# Patient Record
Sex: Male | Born: 1945 | Race: Black or African American | Hispanic: No | Marital: Married | Smoking: Current every day smoker
Health system: Southern US, Community
[De-identification: ages and names within clinical notes are randomized; demographics above are authoritative.]

## PROBLEM LIST (undated history)

## (undated) ENCOUNTER — Emergency Department (HOSPITAL_BASED_OUTPATIENT_CLINIC_OR_DEPARTMENT_OTHER): Admission: EM | Payer: Medicare Other | Source: Home / Self Care

## (undated) DIAGNOSIS — I4891 Unspecified atrial fibrillation: Secondary | ICD-10-CM

## (undated) DIAGNOSIS — I10 Essential (primary) hypertension: Secondary | ICD-10-CM

## (undated) DIAGNOSIS — I7121 Aneurysm of the ascending aorta, without rupture: Secondary | ICD-10-CM

## (undated) DIAGNOSIS — E7800 Pure hypercholesterolemia, unspecified: Secondary | ICD-10-CM

## (undated) DIAGNOSIS — R011 Cardiac murmur, unspecified: Secondary | ICD-10-CM

## (undated) DIAGNOSIS — R931 Abnormal findings on diagnostic imaging of heart and coronary circulation: Secondary | ICD-10-CM

## (undated) DIAGNOSIS — K922 Gastrointestinal hemorrhage, unspecified: Secondary | ICD-10-CM

## (undated) DIAGNOSIS — J189 Pneumonia, unspecified organism: Secondary | ICD-10-CM

## (undated) HISTORY — PX: TONSILLECTOMY: SUR1361

---

## 2001-02-11 ENCOUNTER — Encounter: Payer: Self-pay | Admitting: Internal Medicine

## 2001-02-11 ENCOUNTER — Encounter: Admission: RE | Admit: 2001-02-11 | Discharge: 2001-02-11 | Payer: Self-pay | Admitting: Internal Medicine

## 2002-03-16 ENCOUNTER — Encounter: Payer: Self-pay | Admitting: Emergency Medicine

## 2002-03-16 ENCOUNTER — Emergency Department (HOSPITAL_COMMUNITY): Admission: EM | Admit: 2002-03-16 | Discharge: 2002-03-16 | Payer: Self-pay | Admitting: Emergency Medicine

## 2003-06-01 ENCOUNTER — Encounter: Payer: Self-pay | Admitting: Internal Medicine

## 2003-06-01 ENCOUNTER — Encounter: Admission: RE | Admit: 2003-06-01 | Discharge: 2003-06-01 | Payer: Self-pay | Admitting: Internal Medicine

## 2010-12-30 ENCOUNTER — Emergency Department (INDEPENDENT_AMBULATORY_CARE_PROVIDER_SITE_OTHER)

## 2010-12-30 ENCOUNTER — Emergency Department (HOSPITAL_BASED_OUTPATIENT_CLINIC_OR_DEPARTMENT_OTHER)
Admission: EM | Admit: 2010-12-30 | Discharge: 2010-12-31 | Disposition: A | Discharge status: Home / Self Care | Attending: Emergency Medicine | Admitting: Emergency Medicine

## 2010-12-30 DIAGNOSIS — F17200 Nicotine dependence, unspecified, uncomplicated: Secondary | ICD-10-CM | POA: Insufficient documentation

## 2010-12-30 DIAGNOSIS — R0989 Other specified symptoms and signs involving the circulatory and respiratory systems: Secondary | ICD-10-CM

## 2010-12-30 DIAGNOSIS — R05 Cough: Secondary | ICD-10-CM | POA: Insufficient documentation

## 2010-12-30 DIAGNOSIS — I10 Essential (primary) hypertension: Secondary | ICD-10-CM | POA: Insufficient documentation

## 2010-12-30 DIAGNOSIS — R059 Cough, unspecified: Secondary | ICD-10-CM | POA: Insufficient documentation

## 2010-12-30 DIAGNOSIS — J40 Bronchitis, not specified as acute or chronic: Secondary | ICD-10-CM | POA: Insufficient documentation

## 2013-03-16 ENCOUNTER — Telehealth: Payer: Self-pay | Admitting: Internal Medicine

## 2013-03-16 ENCOUNTER — Emergency Department (HOSPITAL_BASED_OUTPATIENT_CLINIC_OR_DEPARTMENT_OTHER)

## 2013-03-16 ENCOUNTER — Encounter (HOSPITAL_BASED_OUTPATIENT_CLINIC_OR_DEPARTMENT_OTHER): Payer: Self-pay | Admitting: *Deleted

## 2013-03-16 ENCOUNTER — Observation Stay (HOSPITAL_BASED_OUTPATIENT_CLINIC_OR_DEPARTMENT_OTHER)
Admission: EM | Admit: 2013-03-16 | Discharge: 2013-03-17 | Disposition: A | Discharge status: Home / Self Care | Attending: Internal Medicine | Admitting: Internal Medicine

## 2013-03-16 DIAGNOSIS — R1310 Dysphagia, unspecified: Secondary | ICD-10-CM | POA: Insufficient documentation

## 2013-03-16 DIAGNOSIS — R220 Localized swelling, mass and lump, head: Secondary | ICD-10-CM | POA: Insufficient documentation

## 2013-03-16 DIAGNOSIS — I119 Hypertensive heart disease without heart failure: Secondary | ICD-10-CM | POA: Diagnosis present

## 2013-03-16 DIAGNOSIS — E876 Hypokalemia: Secondary | ICD-10-CM | POA: Diagnosis present

## 2013-03-16 DIAGNOSIS — T783XXA Angioneurotic edema, initial encounter: Principal | ICD-10-CM

## 2013-03-16 DIAGNOSIS — I10 Essential (primary) hypertension: Secondary | ICD-10-CM | POA: Insufficient documentation

## 2013-03-16 DIAGNOSIS — T46905A Adverse effect of unspecified agents primarily affecting the cardiovascular system, initial encounter: Secondary | ICD-10-CM | POA: Insufficient documentation

## 2013-03-16 DIAGNOSIS — I1310 Hypertensive heart and chronic kidney disease without heart failure, with stage 1 through stage 4 chronic kidney disease, or unspecified chronic kidney disease: Secondary | ICD-10-CM | POA: Diagnosis present

## 2013-03-16 DIAGNOSIS — Z79899 Other long term (current) drug therapy: Secondary | ICD-10-CM | POA: Insufficient documentation

## 2013-03-16 HISTORY — DX: Essential (primary) hypertension: I10

## 2013-03-16 LAB — BASIC METABOLIC PANEL
BUN: 13 mg/dL (ref 6–23)
CO2: 26 mEq/L (ref 19–32)
Calcium: 10.5 mg/dL (ref 8.4–10.5)
Chloride: 103 mEq/L (ref 96–112)
Creatinine, Ser: 1.2 mg/dL (ref 0.50–1.35)
GFR calc Af Amer: 72 mL/min — ABNORMAL LOW (ref >90)
GFR calc non Af Amer: 62 mL/min — ABNORMAL LOW (ref >90)
Glucose, Bld: 103 mg/dL — ABNORMAL HIGH (ref 70–99)
Potassium: 3.3 mEq/L — ABNORMAL LOW (ref 3.5–5.1)
Sodium: 140 mEq/L (ref 135–145)

## 2013-03-16 LAB — GLUCOSE, CAPILLARY
Glucose-Capillary: 122 mg/dL — ABNORMAL HIGH (ref 70–99)
Glucose-Capillary: 136 mg/dL — ABNORMAL HIGH (ref 70–99)
Glucose-Capillary: 153 mg/dL — ABNORMAL HIGH (ref 70–99)

## 2013-03-16 LAB — CBC WITH DIFFERENTIAL/PLATELET
Basophils Absolute: 0 10*3/uL (ref 0.0–0.1)
Basophils Relative: 0 % (ref 0–1)
Eosinophils Absolute: 0.1 10*3/uL (ref 0.0–0.7)
Eosinophils Relative: 2 % (ref 0–5)
HCT: 42.3 % (ref 39.0–52.0)
Hemoglobin: 14.4 g/dL (ref 13.0–17.0)
Lymphocytes Relative: 30 % (ref 12–46)
Lymphs Abs: 1.7 10*3/uL (ref 0.7–4.0)
MCH: 30.6 pg (ref 26.0–34.0)
MCHC: 34 g/dL (ref 30.0–36.0)
MCV: 89.8 fL (ref 78.0–100.0)
Monocytes Absolute: 0.7 10*3/uL (ref 0.1–1.0)
Monocytes Relative: 14 % — ABNORMAL HIGH (ref 3–12)
Neutro Abs: 3 10*3/uL (ref 1.7–7.7)
Neutrophils Relative %: 54 % (ref 43–77)
Platelets: 238 10*3/uL (ref 150–400)
RBC: 4.71 MIL/uL (ref 4.22–5.81)
RDW: 14.2 % (ref 11.5–15.5)
WBC: 5.5 10*3/uL (ref 4.0–10.5)

## 2013-03-16 LAB — CBC
HCT: 40.6 % (ref 39.0–52.0)
Hemoglobin: 14.3 g/dL (ref 13.0–17.0)
MCH: 31.3 pg (ref 26.0–34.0)
MCHC: 35.2 g/dL (ref 30.0–36.0)
MCV: 88.8 fL (ref 78.0–100.0)
Platelets: 238 10*3/uL (ref 150–400)
RBC: 4.57 MIL/uL (ref 4.22–5.81)
RDW: 14.2 % (ref 11.5–15.5)
WBC: 4.9 10*3/uL (ref 4.0–10.5)

## 2013-03-16 LAB — CREATININE, SERUM
Creatinine, Ser: 1.24 mg/dL (ref 0.50–1.35)
GFR calc Af Amer: 69 mL/min — ABNORMAL LOW (ref >90)
GFR calc non Af Amer: 60 mL/min — ABNORMAL LOW (ref >90)

## 2013-03-16 LAB — MRSA PCR SCREENING: MRSA by PCR: NEGATIVE

## 2013-03-16 MED ORDER — DIPHENHYDRAMINE HCL 50 MG/ML IJ SOLN
12.5000 mg | Freq: Four times a day (QID) | INTRAMUSCULAR | Status: DC | PRN
Start: 2013-03-16 — End: 2013-03-17
  Administered 2013-03-16: 12.5 mg via INTRAVENOUS
  Filled 2013-03-16: qty 1

## 2013-03-16 MED ORDER — DEXAMETHASONE SODIUM PHOSPHATE 10 MG/ML IJ SOLN
INTRAMUSCULAR | Status: AC
  Administered 2013-03-16: 10 mg
  Filled 2013-03-16: qty 1

## 2013-03-16 MED ORDER — SODIUM CHLORIDE 0.9 % IV SOLN
Freq: Once | INTRAVENOUS | Status: AC
  Administered 2013-03-16: 1000 mL via INTRAVENOUS

## 2013-03-16 MED ORDER — DEXAMETHASONE SODIUM PHOSPHATE 10 MG/ML IJ SOLN
10.0000 mg | Freq: Once | INTRAMUSCULAR | Status: AC
  Administered 2013-03-16: 10 mg via INTRAVENOUS

## 2013-03-16 MED ORDER — FAMOTIDINE IN NACL 20-0.9 MG/50ML-% IV SOLN
INTRAVENOUS | Status: AC
  Administered 2013-03-16: 20 mg via INTRAVENOUS
  Filled 2013-03-16: qty 50

## 2013-03-16 MED ORDER — SODIUM CHLORIDE 0.9 % IV SOLN: INTRAVENOUS | Status: DC

## 2013-03-16 MED ORDER — DIPHENHYDRAMINE HCL 50 MG/ML IJ SOLN
INTRAMUSCULAR | Status: AC
  Filled 2013-03-16: qty 1

## 2013-03-16 MED ORDER — METHYLPREDNISOLONE SODIUM SUCC 125 MG IJ SOLR
60.0000 mg | INTRAMUSCULAR | Status: DC
  Administered 2013-03-17: 60 mg via INTRAVENOUS
  Filled 2013-03-16 (×2): qty 0.96

## 2013-03-16 MED ORDER — DIPHENHYDRAMINE HCL 50 MG/ML IJ SOLN
50.0000 mg | Freq: Once | INTRAMUSCULAR | Status: AC
  Administered 2013-03-16: 50 mg via INTRAVENOUS

## 2013-03-16 MED ORDER — FAMOTIDINE IN NACL 20-0.9 MG/50ML-% IV SOLN
20.0000 mg | Freq: Once | INTRAVENOUS | Status: AC
  Administered 2013-03-16: 20 mg via INTRAVENOUS

## 2013-03-16 MED ORDER — ONDANSETRON HCL 4 MG PO TABS: 4.0000 mg | ORAL_TABLET | Freq: Four times a day (QID) | ORAL | Status: DC | PRN

## 2013-03-16 MED ORDER — HYDRALAZINE HCL 20 MG/ML IJ SOLN: 5.0000 mg | Freq: Four times a day (QID) | INTRAMUSCULAR | Status: DC | PRN

## 2013-03-16 MED ORDER — SODIUM CHLORIDE 0.9 % IJ SOLN
3.0000 mL | Freq: Two times a day (BID) | INTRAMUSCULAR | Status: DC
  Administered 2013-03-16 – 2013-03-17 (×3): 3 mL via INTRAVENOUS

## 2013-03-16 MED ORDER — ONDANSETRON HCL 4 MG/2ML IJ SOLN: 4.0000 mg | Freq: Four times a day (QID) | INTRAMUSCULAR | Status: DC | PRN

## 2013-03-16 MED ORDER — POTASSIUM CHLORIDE CRYS ER 20 MEQ PO TBCR
40.0000 meq | EXTENDED_RELEASE_TABLET | Freq: Once | ORAL | Status: AC
  Administered 2013-03-16: 40 meq via ORAL
  Filled 2013-03-16: qty 2

## 2013-03-16 MED ORDER — ENOXAPARIN SODIUM 40 MG/0.4ML ~~LOC~~ SOLN
40.0000 mg | SUBCUTANEOUS | Status: DC
  Administered 2013-03-16: 40 mg via SUBCUTANEOUS
  Filled 2013-03-16 (×2): qty 0.4

## 2013-03-16 NOTE — ED Notes (Signed)
I placed patient on wall monitor/5 lead.

## 2013-03-16 NOTE — ED Notes (Signed)
Updated pt's wife that he will be admitted to 2100 at [facility]

## 2013-03-16 NOTE — ED Notes (Signed)
Pt's airway remains patent. Sinus on tele. States no real change after medications again. VSS. Awaiting [facility]. No distress.

## 2013-03-16 NOTE — ED Notes (Signed)
Report given to [facility]. 

## 2013-03-16 NOTE — ED Provider Notes (Signed)
CSN: [id number]     Arrival date & time 03/16/13  0510 History     First MD Initiated Contact with Patient 03/16/13 [phone]     Chief Complaint  Patient presents with  . Throat Swelling    (Consider location/radiation/quality/duration/timing/severity/associated sxs/prior Treatment) HPI This is a 67 year old male who was changed from losartan to lisinopril about a week ago. He is here with about 1-2 hours of swelling in his throat. He feels as if his ability to breathe is being cut off and his voice is altered. He is still able to speak and breathe without difficulty. The symptoms are moderate at this time. It is not associated with pain. He's never had a history of this in the past.  Past Medical History  Diagnosis Date  . Hypertension    Past Surgical History  Procedure Laterality Date  . Tonsillectomy     No family history on file. History  Substance Use Topics  . Smoking status: Current Every Day Smoker  . Smokeless tobacco: Not on file  . Alcohol Use: Yes    Review of Systems  All other systems reviewed and are negative.    Allergies  Ace inhibitors  Home Medications   Current Outpatient Rx  Name  Route  Sig  Dispense  Refill  . amLODipine (NORVASC) 10 MG tablet   Oral   Take 10 mg by mouth daily.         Marland Kitchen HYDROCHLOROTHIAZIDE PO   Oral   Take by mouth.         Marland Kitchen lisinopril (PRINIVIL,ZESTRIL) 40 MG tablet   Oral   Take 40 mg by mouth daily.         Marland Kitchen triamterene-hydrochlorothiazide (DYAZIDE) 50-25 MG per capsule   Oral   Take 1 capsule by mouth every morning.          BP 167/102  Pulse 101  Temp(Src) 98 F (36.7 C) (Oral)  Ht 5\' 11"  (1.803 m)  Wt 224 lb (101.606 kg)  BMI 31.26 kg/m2  SpO2 98%  Physical Exam General: Well-developed, well-nourished male in no acute distress; appearance consistent with age of record HENT: normocephalic, atraumatic; angioedema of soft palate; "hot potato" voice Eyes: pupils equal round and reactive to light;  extraocular muscles intact Neck: supple Heart: regular rate and rhythm Lungs: clear to auscultation bilaterally Abdomen: soft; nondistended Extremities: No deformity; full range of motion; pulses normal Neurologic: Awake, alert and oriented; motor function intact in all extremities and symmetric; no facial droop Skin: Warm and dry Psychiatric: Normal mood and affect    ED Course   Procedures (including critical care time)   MDM   Nursing notes and vitals signs, including pulse oximetry, reviewed.  Summary of this visit's results, reviewed by myself:  Labs:  Results for orders placed during the hospital encounter of 03/16/13 (from the past 24 hour(s))  BASIC METABOLIC PANEL     Status: Abnormal   Collection Time    03/16/13  5:27 AM      Result Value Range   Sodium 140  135 - 145 mEq/L   Potassium 3.3 (*) 3.5 - 5.1 mEq/L   Chloride 103  96 - 112 mEq/L   CO2 26  19 - 32 mEq/L   Glucose, Bld 103 (*) 70 - 99 mg/dL   BUN 13  6 - 23 mg/dL   Creatinine, Ser 1.19  0.50 - 1.35 mg/dL   Calcium 14.7  8.4 - 82.9 mg/dL   GFR calc non  Af Amer 62 (*) >90 mL/min   GFR calc Af Amer 72 (*) >90 mL/min  CBC WITH DIFFERENTIAL     Status: Abnormal   Collection Time    03/16/13  5:27 AM      Result Value Range   WBC 5.5  4.0 - 10.5 K/uL   RBC 4.71  4.22 - 5.81 MIL/uL   Hemoglobin 14.4  13.0 - 17.0 g/dL   HCT 16.1  09.6 - 04.5 %   MCV 89.8  78.0 - 100.0 fL   MCH 30.6  26.0 - 34.0 pg   MCHC 34.0  30.0 - 36.0 g/dL   RDW 40.9  81.1 - 91.4 %   Platelets 238  150 - 400 K/uL   Neutrophils Relative % 54  43 - 77 %   Neutro Abs 3.0  1.7 - 7.7 K/uL   Lymphocytes Relative 30  12 - 46 %   Lymphs Abs 1.7  0.7 - 4.0 K/uL   Monocytes Relative 14 (*) 3 - 12 %   Monocytes Absolute 0.7  0.1 - 1.0 K/uL   Eosinophils Relative 2  0 - 5 %   Eosinophils Absolute 0.1  0.0 - 0.7 K/uL   Basophils Relative 0  0 - 1 %   Basophils Absolute 0.0  0.0 - 0.1 K/uL    Imaging Studies: Dg Neck Soft  Tissue  03/16/2013   *RADIOLOGY REPORT*  Clinical Data: Throat swelling starting 2 hours prior.  NECK SOFT TISSUES - 1+ VIEW  Comparison: None.  Findings: Mild thickening of the posterior pharyngeal tissues at and above the level of C1.  When accounting for rotation, the epiglottis is not thickened.  The aryepiglottic folds are distinct.  The imaged airway is patent, seen to the level of the esophageal verge.  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  IMPRESSION: 1. The imaged airway is patent. 2. Mild posterior pharyngeal thickening at the level of the nasopharynx.   Original Report Authenticated By: Tiburcio Pea    6:11 AM Patient accepted for transfer to [facility] for observation. He has been given dexamethasone, Benadryl and Pepcid IV. He states his symptoms remain unchanged. He is still able to speak and has no breathing difficulty at this time.  Hanley Seamen, MD 03/16/13 [phone]

## 2013-03-16 NOTE — ED Notes (Signed)
Report to Lurena Joiner, [place] 2100

## 2013-03-16 NOTE — Progress Notes (Signed)
Utilization Review Completed.Gracin Mcpartland T7/27/2014  

## 2013-03-16 NOTE — H&P (Addendum)
[facility] History and Physical  Richard Sampson:[id number] DOB: [birth year] DOA: 03/16/2013  Referring physician: EDP PCP: No primary provider on file.  Specialists: none  Chief Complaint: tightness in the throat last night.   HPI: Richard Sampson is a 67 y.o. male with prior h/o hypertension, came in from med center for angioedema after lisinopril intake. Pt reports that he took a lisinopril pill at 10 30, few after after that felt like his throat is closing up and had difficulty swallowing,went to ED and [facility]. His airway is patent , on RA, with good oxygen sat.s his tongue swelling improved and pt reports his throat feels little better. He is admitted to medical service for further evaluation. He received a dose of decadron, benadryl and pepcid in ED.   Review of Systems: The patient denies anorexia, fever, weight loss,, vision loss, decreased hearing, hoarseness, chest pain, syncope, dyspnea on exertion, peripheral edema, balance deficits, hemoptysis, abdominal pain, melena, hematochezia, severe indigestion/heartburn, hematuria, incontinence, genital sores, muscle weakness, suspicious skin lesions, transient blindness, difficulty walking, depression, unusual weight change, abnormal bleeding, enlarged lymph nodes, angioedema, and breast masses.    Past Medical History  Diagnosis Date  . Hypertension    Past Surgical History  Procedure Laterality Date  . Tonsillectomy     Social History:  reports that he has been smoking.  He does not have any smokeless tobacco history on file. He reports that  drinks alcohol. His drug history is not on file.  where does patient live--home,  Allergies  Allergen Reactions  . Ace Inhibitors Other (See Comments)    Reaction unknown    No family history on file.   Prior to Admission medications   Medication Sig Start Date End Date Taking? Authorizing Provider  amLODipine (NORVASC) 10 MG  tablet Take 10 mg by mouth daily.   Yes Historical Provider, MD  HYDROCHLOROTHIAZIDE PO Take by mouth.   Yes Historical Provider, MD  lisinopril (PRINIVIL,ZESTRIL) 40 MG tablet Take 40 mg by mouth daily.   Yes Historical Provider, MD   Physical Exam: Filed Vitals:   03/16/13 0900 03/16/13 1000 03/16/13 1100 03/16/13 1200  BP: 114/73 120/81 116/74 122/85  Pulse: 76 74 80 82  Temp:    97.7 F (36.5 C)  TempSrc:      Resp: 14 15 14 15   Height:      Weight:      SpO2: 95% 93% 94% 95%    Constitutional: Vital signs reviewed.  Patient is a well-developed and well-nourished in no acute distress and cooperative with exam. Alert and oriented x3.  Head: Normocephalic and atraumatic Mouth: no erythema or exudates, MMM Eyes: PERRL, EOMI, conjunctivae normal, No scleral icterus.  Neck: Supple, Trachea midline normal ROM, No JVD, mass, thyromegaly, or carotid bruit present.  Cardiovascular: RRR, S1 normal, S2 normal, no MRG, pulses symmetric and intact bilaterally Pulmonary/Chest: normal respiratory effort, CTAB, no wheezes, rales, or rhonchi Abdominal: Soft. Non-tender, non-distended, bowel sounds are normal, no masses, organomegaly, or guarding present.  Musculoskeletal: No joint deformities, erythema, or stiffness, ROM full and no nontender Neurological: A&O x3, Strength is normal and symmetric bilaterally, cranial nerve II-XII are grossly intact, no focal motor deficit, sensory intact to light touch bilaterally.  Skin: Warm, dry and intact. No rash, cyanosis, or clubbing.  Psychiatric: Normal mood and affect. speech and behavior is normal.  Labs on Admission:  Basic Metabolic Panel:  Recent Labs Lab 03/16/13 0527 03/16/13 1156  NA 140  --   K 3.3*  --   CL 103  --   CO2 26  --   GLUCOSE 103*  --   BUN 13  --   CREATININE 1.20 1.24  CALCIUM 10.5  --    Liver Function Tests: No results found for this basename: AST, ALT, ALKPHOS, BILITOT, PROT, ALBUMIN,  in the last 168 hours No  results found for this basename: LIPASE, AMYLASE,  in the last 168 hours No results found for this basename: AMMONIA,  in the last 168 hours CBC:  Recent Labs Lab 03/16/13 0527 03/16/13 1156  WBC 5.5 4.9  NEUTROABS 3.0  --   HGB 14.4 14.3  HCT 42.3 40.6  MCV 89.8 88.8  PLT 238 238   Cardiac Enzymes: No results found for this basename: CKTOTAL, CKMB, CKMBINDEX, TROPONINI,  in the last 168 hours  BNP (last 3 results) No results found for this basename: PROBNP,  in the last 8760 hours CBG:  Recent Labs Lab 03/16/13 0728  GLUCAP 122*    Radiological Exams on Admission: Dg Neck Soft Tissue  03/16/2013   *RADIOLOGY REPORT*  Clinical Data: Throat swelling starting 2 hours prior.  NECK SOFT TISSUES - 1+ VIEW  Comparison: None.  Findings: Mild thickening of the posterior pharyngeal tissues at and above the level of C1.  When accounting for rotation, the epiglottis is not thickened.  The aryepiglottic folds are distinct.  The imaged airway is patent, seen to the level of the esophageal verge.  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  IMPRESSION: 1. The imaged airway is patent. 2. Mild posterior pharyngeal thickening at the level of the nasopharynx.   Original Report Authenticated By: Tiburcio Pea    EKG: NOT DONE.   Assessment/Plan Active Problems: 1. ANGIOEDEMA from the ace inhibitors:  - admit to stepdown - observation, with symptomatic treatment.  - parent airway with good oxygen sat's on RA - PT reports improvement of the tightness int he throat.  - solumedrol, benadryl and pepcid on board.   2. Hypertension: controlled. Home meds on hold. Prn hydralazine.   3. Hypokalemia: wil be repleted as needed.   3. DVT prophylaxis.   Code Status: full code Family Communication: wife at bedside and updated. Disposition Plan: pending.   Time spent: 65 min  Rollin Kotowski [facility] Pager [phone]  If  7PM-7AM, please contact night-coverage [email or web address] Password [facility] 03/16/2013, 1:49 PM

## 2013-03-16 NOTE — ED Notes (Addendum)
Pt c/o "throat swelling" that started about 2 hours ago. Denies any new foods. Started on Lisinopril one week ago.  Pt presents with swelling to the posterior throat. Able to talk and maintain airway at present. resp even and unlabored. 02 sat 97% on RA.

## 2013-03-16 NOTE — Telephone Encounter (Signed)
Received a call from Dr. Read Drivers from [facility] regarding transfer to [facility]. This patient was recent switched from losartan to lisinopril and developed angioedema. Currently, he is not having airway obstruction and is hemodynamically stable. He needs admission for further monitoring.  He is a patient of Dr. Velna Hatchet.  I accepted the patient for transfer to a stepdown bed through [facility].

## 2013-03-16 NOTE — ED Notes (Signed)
[facility] here to transport pt to [facility] 

## 2013-03-16 NOTE — Progress Notes (Signed)
Pt. Admitted to 2110. He is alert and oriented x3. Airway is patent. He is able to speak in his normal voice. PIV is infusing at [facility] and he has his call bell. He knows to call for any difficulty with swallowing. He is on RA with sa02 of 98%.

## 2013-03-17 LAB — CBC
HCT: 37.6 % — ABNORMAL LOW (ref 39.0–52.0)
Hemoglobin: 13.2 g/dL (ref 13.0–17.0)
MCH: 31 pg (ref 26.0–34.0)
MCHC: 35.1 g/dL (ref 30.0–36.0)
MCV: 88.3 fL (ref 78.0–100.0)
Platelets: 212 10*3/uL (ref 150–400)
RBC: 4.26 MIL/uL (ref 4.22–5.81)
RDW: 14.2 % (ref 11.5–15.5)
WBC: 6.3 10*3/uL (ref 4.0–10.5)

## 2013-03-17 LAB — BASIC METABOLIC PANEL
BUN: 15 mg/dL (ref 6–23)
CO2: 27 mEq/L (ref 19–32)
Calcium: 9.5 mg/dL (ref 8.4–10.5)
Chloride: 102 mEq/L (ref 96–112)
Creatinine, Ser: 1.22 mg/dL (ref 0.50–1.35)
GFR calc Af Amer: 71 mL/min — ABNORMAL LOW (ref >90)
GFR calc non Af Amer: 61 mL/min — ABNORMAL LOW (ref >90)
Glucose, Bld: 117 mg/dL — ABNORMAL HIGH (ref 70–99)
Potassium: 3.3 mEq/L — ABNORMAL LOW (ref 3.5–5.1)
Sodium: 137 mEq/L (ref 135–145)

## 2013-03-17 MED ORDER — POTASSIUM CHLORIDE CRYS ER 20 MEQ PO TBCR
20.0000 meq | EXTENDED_RELEASE_TABLET | ORAL | Status: AC
  Administered 2013-03-17 (×2): 20 meq via ORAL
  Filled 2013-03-17 (×2): qty 1

## 2013-03-17 MED ORDER — EPINEPHRINE 0.3 MG/0.3ML IJ SOAJ: 0.3000 mg | Freq: Once | INTRAMUSCULAR | Status: DC

## 2013-03-17 MED ORDER — FAMOTIDINE 20 MG PO TABS: 20.0000 mg | ORAL_TABLET | Freq: Two times a day (BID) | ORAL | Status: DC

## 2013-03-17 MED ORDER — DIPHENHYDRAMINE HCL 25 MG PO TABS: ORAL_TABLET | ORAL | Status: DC

## 2013-03-17 MED ORDER — PREDNISONE 10 MG PO TABS: ORAL_TABLET | ORAL | Status: DC

## 2013-03-17 NOTE — Progress Notes (Signed)
[facility] ADULT ICU REPLACEMENT PROTOCOL FOR AM LAB REPLACEMENT ONLY  The patient does apply for the [facility] Adult ICU Electrolyte Replacment Protocol based on the criteria listed below:   1. Is GFR >/= 40 ml/min? yes  Patient's GFR today is 61  2. Is urine output >/= 0.5 ml/kg/hr for the last 6 hours? yes Patient's UOP is 0.7 ml/kg/hr 3. Is BUN < 60 mg/dL? yes  Patient's BUN today is 15 4. Abnormal electrolyte(s): K3.3 5. Ordered repletion with: PO/KCL 6. If a panic level lab has been reported, has the CCM MD in charge been notified? yes.   Physician:  Langston Masker MD  Melrose Nakayama 03/17/2013 6:09 AM

## 2013-03-17 NOTE — Discharge Summary (Signed)
Physician Discharge Summary  TAREZ BOWNS BJY:[id number] DOB: [birth year] DOA: 03/16/2013  PCP: Dr. [name]  Admit date: 03/16/2013 Discharge date: 03/17/2013  Time spent: 30 minutes  Recommendations for Outpatient Follow-up:  1. Followup with [facility] as previously scheduled 2. Followup with primary care physician Dr. Allyne Gee in 1-2 weeks for routine hospital follow up 3. Patient advised to keep EpiPen with him at all times and to avoid ACE inhibitors and angiotensin blocking agents 4. Recommend repeat electrolyte panel to followup with primary care physician. Noted to have hypokalemia which was mild at presentation and likely related to ongoing thiazide diuretic usage. Patient may need supplemental potassium if he is to continue this medication over the long term.  Discharge Diagnoses:    ACE associated Angio-edema    Essential hypertension, benign   Hypokalemia  Discharge Condition: stable  Diet recommendation: Heart healthy  Filed Weights   03/16/13 0514  Weight: 101.606 kg (224 lb)    History of present illness:  67 yo male patient w/ hypertension. Sent from [facility] for angioedema onset after taking 4th dose of new medication: Lisinopril. Patient developed a sensation of throat closing up and difficulty swallowing after taking this medication and was noted to have symptoms of angioedema upon presentation to the emergency department. Because of concerns for progression of allergic reaction and concerns for airway obstruction patient was transferred to [facility]. On room air his saturations were good and by the time he arrived to this facility his tongue edema had improved and the patient reported decreased sensation of closing throat. He had been treated with Decadron, Benadryl and Pepcid at the previous facility.  Hospital Course:   Allergic reaction caused by drug / angioedema due to ACE inhibitor -Complete resolution of symptoms with  administration of steroids, H2 blockers and antihistamines -Given prescription for EpiPen and instructed to take over-the-counter Benadryl and Pepcid for the next 5 days -Instructed to call 911 / return ER if symptoms recur -Also given five-day taper of steroids beginning with 50 mg dose and decreasing by 10 mg daily until completed -Instructed to abstain from angiotensin receptor blocking agents as well as ACE inhibitors life-long  Hypokalemia -on diuretics prior to admission -recommend BMET at PCP follow up - may need long term supplemental KCL  Procedures:  None  Consultations:  None  Discharge Exam: Filed Vitals:   03/17/13 0700 03/17/13 0800 03/17/13 0842 03/17/13 0900  BP: 120/73 124/82  141/102  Pulse: 79 71  85  Temp:   98 F (36.7 C)   TempSrc:   Oral   Resp: 14 17  19   Height:      Weight:      SpO2: 96% 96%  99%   General: No acute respiratory distress ENT: Previous periorbital edema has resolved, no tongue edema, no difficulty swallowing and able to maintain/swallow oral secretions Lungs: Clear to auscultation bilaterally without wheezes or crackles, RA Cardiovascular: Regular rate and rhythm without murmur gallop or rub normal S1 and S2, no peripheral edema or JVD Abdomen: Nontender, nondistended, soft, bowel sounds positive, no rebound, no ascites, no appreciable mass Musculoskeletal: No significant cyanosis, clubbing of bilateral lower extremities Neurological: Alert and oriented x 3, moves all extremities x 4 without focal neurological deficits, CN 2-12 intact   Discharge Instructions      Discharge Orders   Future Orders Complete By Expires     Call MD for:  difficulty breathing, headache or visual disturbances  As directed  Call MD for:  hives  As directed     Call MD for:  persistant dizziness or light-headedness  As directed     Call MD for:  As directed     Scheduling Instructions:      Any recurrence of swelling of face or eyes, swelling of  tongue, difficulty swallowing or sensation of throat closing, or shortness of breath. If these symptoms do recur please utilize EpiPen and notify M.D./call EMS immediately    Diet - low sodium heart healthy  As directed     Increase activity slowly  As directed         Medication List         acetaminophen 325 MG tablet  Commonly known as:  TYLENOL  Take 650 mg by mouth every 6 (six) hours as needed for pain.     amLODipine 10 MG tablet  Commonly known as:  NORVASC  Take 10 mg by mouth daily.     atorvastatin 20 MG tablet  Commonly known as:  LIPITOR  Take 10 mg by mouth daily.     diphenhydrAMINE 25 MG tablet  Commonly known as:  BENADRYL  Take 1/2 tab or 12.5 mg 3 times daily for recent allergic reaction     EPINEPHrine 0.3 mg/0.3 mL Soaj  Commonly known as:  EPIPEN  Inject 0.3 mLs (0.3 mg total) into the muscle once.     famotidine 20 MG tablet  Commonly known as:  PEPCID  Take 1 tablet (20 mg total) by mouth 2 (two) times daily.     potassium chloride SA 20 MEQ tablet  Commonly known as:  K-DUR,KLOR-CON  Take 20 mEq by mouth 2 (two) times daily.     predniSONE 10 MG tablet  Commonly known as:  DELTASONE  Take 5 tabs daily beginning 7/28 then 4 tabs on 7/29 then 3 tabs on 7/30 then 2 tabs on 7/31 then 1 tab on 8/1 then stop     triamterene-hydrochlorothiazide 75-50 MG per tablet  Commonly known as:  MAXZIDE  Take 1 tablet by mouth daily.     Vitamin D (Ergocalciferol) 50000 UNITS Caps  Commonly known as:  DRISDOL  Take 50,000 Units by mouth every 7 (seven) days. Thursdays       Allergies  Allergen Reactions  . Ace Inhibitors Swelling    Throat swelling; pt was on lisinopril  . Angiotensin Receptor Blockers     Cross reactivity in known ACE I allergy   Follow-up Information   Schedule an appointment as soon as possible for a visit with [facility]. (to be seen in 1-2 weeks)    Contact information:   as previously scheduled      Schedule an  appointment as soon as possible for a visit with SANDERS,ROBYN N, MD. (call to be seen for routine hospital follow up)    Contact information:   [address] [place] [address] [phone]        The results of significant diagnostics from this hospitalization (including imaging, microbiology, ancillary and laboratory) are listed below for reference.    Significant Diagnostic Studies: Dg Neck Soft Tissue  03/16/2013   *RADIOLOGY REPORT*  Clinical Data: Throat swelling starting 2 hours prior.  NECK SOFT TISSUES - 1+ VIEW  Comparison: None.  Findings: Mild thickening of the posterior pharyngeal tissues at and above the level of C1.  When accounting for rotation, the epiglottis is not thickened.  The aryepiglottic folds are distinct.  The  imaged airway is patent, seen to the level of the esophageal verge.  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  IMPRESSION: 1. The imaged airway is patent. 2. Mild posterior pharyngeal thickening at the level of the nasopharynx.   Original Report Authenticated By: Tiburcio Pea    Microbiology: Recent Results (from the past 240 hour(s))  MRSA PCR SCREENING     Status: None   Collection Time    03/16/13  7:37 AM      Result Value Range Status   MRSA by PCR NEGATIVE  NEGATIVE Final   Comment:            The GeneXpert MRSA Assay (FDA     approved for NASAL specimens     only), is one component of a     comprehensive MRSA colonization     surveillance program. It is not     intended to diagnose MRSA     infection nor to guide or     monitor treatment for     MRSA infections.     Labs: Basic Metabolic Panel:  Recent Labs Lab 03/16/13 0527 03/16/13 1156 03/17/13 0447  NA 140  --  137  K 3.3*  --  3.3*  CL 103  --  102  CO2 26  --  27  GLUCOSE 103*  --  117*  BUN 13  --  15  CREATININE 1.20 1.24 1.22  CALCIUM 10.5  --  9.5   CBC:  Recent Labs Lab 03/16/13 0527  03/16/13 1156 03/17/13 0447  WBC 5.5 4.9 6.3  NEUTROABS 3.0  --   --   HGB 14.4 14.3 13.2  HCT 42.3 40.6 37.6*  MCV 89.8 88.8 88.3  PLT 238 238 212   CBG:  Recent Labs Lab 03/16/13 0728 03/16/13 1559 03/16/13 2024  GLUCAP 122* 136* 153*   Signed:  ELLIS,ALLISON L. ANP [facility] 03/17/2013, 10:50 AM  I have personally examined this patient and reviewed the entire database. I have reviewed the above note, made any necessary editorial changes, and agree with its content.  Lonia Blood, MD [facility]

## 2013-03-17 NOTE — Progress Notes (Signed)
Discharge instrctions given to pt and pt discharged home with wife

## 2013-09-01 DIAGNOSIS — Z79899 Other long term (current) drug therapy: Secondary | ICD-10-CM | POA: Diagnosis not present

## 2013-09-01 DIAGNOSIS — I10 Essential (primary) hypertension: Secondary | ICD-10-CM | POA: Diagnosis not present

## 2013-09-01 DIAGNOSIS — M542 Cervicalgia: Secondary | ICD-10-CM | POA: Diagnosis not present

## 2013-09-24 DIAGNOSIS — E663 Overweight: Secondary | ICD-10-CM | POA: Diagnosis not present

## 2013-09-24 DIAGNOSIS — M549 Dorsalgia, unspecified: Secondary | ICD-10-CM | POA: Diagnosis not present

## 2013-09-25 ENCOUNTER — Other Ambulatory Visit: Payer: Self-pay | Admitting: Neurosurgery

## 2013-09-25 DIAGNOSIS — M549 Dorsalgia, unspecified: Secondary | ICD-10-CM

## 2013-09-28 ENCOUNTER — Ambulatory Visit
Admission: RE | Admit: 2013-09-28 | Discharge: 2013-09-28 | Disposition: A | Discharge status: Home / Self Care | Source: Ambulatory Visit | Attending: Neurosurgery | Admitting: Neurosurgery

## 2013-09-28 DIAGNOSIS — M4310 Spondylolisthesis, site unspecified: Secondary | ICD-10-CM | POA: Diagnosis not present

## 2013-09-28 DIAGNOSIS — M5126 Other intervertebral disc displacement, lumbar region: Secondary | ICD-10-CM | POA: Diagnosis not present

## 2013-09-28 DIAGNOSIS — M47817 Spondylosis without myelopathy or radiculopathy, lumbosacral region: Secondary | ICD-10-CM | POA: Diagnosis not present

## 2013-09-28 DIAGNOSIS — M48061 Spinal stenosis, lumbar region without neurogenic claudication: Secondary | ICD-10-CM | POA: Diagnosis not present

## 2013-09-28 DIAGNOSIS — M549 Dorsalgia, unspecified: Secondary | ICD-10-CM

## 2013-11-12 DIAGNOSIS — Q762 Congenital spondylolisthesis: Secondary | ICD-10-CM | POA: Diagnosis not present

## 2013-11-12 DIAGNOSIS — I10 Essential (primary) hypertension: Secondary | ICD-10-CM | POA: Diagnosis not present

## 2014-07-17 ENCOUNTER — Emergency Department (HOSPITAL_BASED_OUTPATIENT_CLINIC_OR_DEPARTMENT_OTHER): Payer: BC Managed Care – PPO

## 2014-07-17 ENCOUNTER — Encounter (HOSPITAL_BASED_OUTPATIENT_CLINIC_OR_DEPARTMENT_OTHER): Payer: Self-pay

## 2014-07-17 ENCOUNTER — Inpatient Hospital Stay (HOSPITAL_BASED_OUTPATIENT_CLINIC_OR_DEPARTMENT_OTHER)
Admission: EM | Admit: 2014-07-17 | Discharge: 2014-07-18 | DRG: 309 | Disposition: A | Discharge status: Home / Self Care | Payer: BC Managed Care – PPO | Attending: Internal Medicine | Admitting: Internal Medicine

## 2014-07-17 DIAGNOSIS — R002 Palpitations: Secondary | ICD-10-CM | POA: Diagnosis not present

## 2014-07-17 DIAGNOSIS — I059 Rheumatic mitral valve disease, unspecified: Secondary | ICD-10-CM | POA: Diagnosis not present

## 2014-07-17 DIAGNOSIS — R739 Hyperglycemia, unspecified: Secondary | ICD-10-CM | POA: Diagnosis present

## 2014-07-17 DIAGNOSIS — I480 Paroxysmal atrial fibrillation: Secondary | ICD-10-CM

## 2014-07-17 DIAGNOSIS — I4891 Unspecified atrial fibrillation: Secondary | ICD-10-CM | POA: Diagnosis present

## 2014-07-17 DIAGNOSIS — Q211 Atrial septal defect: Secondary | ICD-10-CM

## 2014-07-17 DIAGNOSIS — I10 Essential (primary) hypertension: Secondary | ICD-10-CM | POA: Diagnosis present

## 2014-07-17 DIAGNOSIS — T783XXA Angioneurotic edema, initial encounter: Secondary | ICD-10-CM | POA: Diagnosis present

## 2014-07-17 DIAGNOSIS — E876 Hypokalemia: Secondary | ICD-10-CM | POA: Diagnosis present

## 2014-07-17 DIAGNOSIS — F17210 Nicotine dependence, cigarettes, uncomplicated: Secondary | ICD-10-CM | POA: Diagnosis present

## 2014-07-17 DIAGNOSIS — R0602 Shortness of breath: Secondary | ICD-10-CM | POA: Diagnosis not present

## 2014-07-17 DIAGNOSIS — N179 Acute kidney failure, unspecified: Secondary | ICD-10-CM | POA: Diagnosis present

## 2014-07-17 DIAGNOSIS — E785 Hyperlipidemia, unspecified: Secondary | ICD-10-CM

## 2014-07-17 DIAGNOSIS — R079 Chest pain, unspecified: Secondary | ICD-10-CM | POA: Diagnosis not present

## 2014-07-17 DIAGNOSIS — I1310 Hypertensive heart and chronic kidney disease without heart failure, with stage 1 through stage 4 chronic kidney disease, or unspecified chronic kidney disease: Secondary | ICD-10-CM | POA: Diagnosis present

## 2014-07-17 DIAGNOSIS — I119 Hypertensive heart disease without heart failure: Secondary | ICD-10-CM | POA: Diagnosis present

## 2014-07-17 DIAGNOSIS — Z79899 Other long term (current) drug therapy: Secondary | ICD-10-CM | POA: Diagnosis not present

## 2014-07-17 DIAGNOSIS — R072 Precordial pain: Secondary | ICD-10-CM | POA: Diagnosis not present

## 2014-07-17 DIAGNOSIS — R52 Pain, unspecified: Secondary | ICD-10-CM

## 2014-07-17 HISTORY — DX: Pure hypercholesterolemia, unspecified: E78.00

## 2014-07-17 HISTORY — DX: Unspecified atrial fibrillation: I48.91

## 2014-07-17 HISTORY — DX: Paroxysmal atrial fibrillation: I48.0

## 2014-07-17 LAB — CBC WITH DIFFERENTIAL/PLATELET
Basophils Absolute: 0 10*3/uL (ref 0.0–0.1)
Basophils Relative: 0 % (ref 0–1)
Eosinophils Absolute: 0.1 10*3/uL (ref 0.0–0.7)
Eosinophils Relative: 2 % (ref 0–5)
HCT: 38.4 % — ABNORMAL LOW (ref 39.0–52.0)
Hemoglobin: 12.7 g/dL — ABNORMAL LOW (ref 13.0–17.0)
Lymphocytes Relative: 26 % (ref 12–46)
Lymphs Abs: 1.6 10*3/uL (ref 0.7–4.0)
MCH: 28.7 pg (ref 26.0–34.0)
MCHC: 33.1 g/dL (ref 30.0–36.0)
MCV: 86.9 fL (ref 78.0–100.0)
Monocytes Absolute: 1.2 10*3/uL — ABNORMAL HIGH (ref 0.1–1.0)
Monocytes Relative: 20 % — ABNORMAL HIGH (ref 3–12)
Neutro Abs: 3.2 10*3/uL (ref 1.7–7.7)
Neutrophils Relative %: 52 % (ref 43–77)
Platelets: 238 10*3/uL (ref 150–400)
RBC: 4.42 MIL/uL (ref 4.22–5.81)
RDW: 15.7 % — ABNORMAL HIGH (ref 11.5–15.5)
WBC: 6.1 10*3/uL (ref 4.0–10.5)

## 2014-07-17 LAB — COMPREHENSIVE METABOLIC PANEL
ALT: 17 U/L (ref 0–53)
AST: 19 U/L (ref 0–37)
Albumin: 3.6 g/dL (ref 3.5–5.2)
Alkaline Phosphatase: 86 U/L (ref 39–117)
Anion gap: 14 (ref 5–15)
BUN: 26 mg/dL — ABNORMAL HIGH (ref 6–23)
CO2: 25 mEq/L (ref 19–32)
Calcium: 9.5 mg/dL (ref 8.4–10.5)
Chloride: 104 mEq/L (ref 96–112)
Creatinine, Ser: 1.7 mg/dL — ABNORMAL HIGH (ref 0.50–1.35)
GFR calc Af Amer: 47 mL/min — ABNORMAL LOW (ref >90)
GFR calc non Af Amer: 40 mL/min — ABNORMAL LOW (ref >90)
Glucose, Bld: 126 mg/dL — ABNORMAL HIGH (ref 70–99)
Potassium: 3.5 mEq/L — ABNORMAL LOW (ref 3.7–5.3)
Sodium: 143 mEq/L (ref 137–147)
Total Bilirubin: <0.2 mg/dL — ABNORMAL LOW (ref 0.3–1.2)
Total Protein: 7.6 g/dL (ref 6.0–8.3)

## 2014-07-17 LAB — TROPONIN I
Troponin I: <0.3 ng/mL (ref <0.30)
Troponin I: <0.3 ng/mL (ref <0.30)
Troponin I: <0.3 ng/mL (ref <0.30)
Troponin I: <0.3 ng/mL (ref <0.30)

## 2014-07-17 LAB — HEMOGLOBIN A1C
Hgb A1c MFr Bld: 5.8 % — ABNORMAL HIGH (ref <5.7)
Mean Plasma Glucose: 120 mg/dL — ABNORMAL HIGH (ref <117)

## 2014-07-17 LAB — PROTIME-INR
INR: 1.04 (ref 0.00–1.49)
Prothrombin Time: 13.7 seconds (ref 11.6–15.2)

## 2014-07-17 LAB — D-DIMER, QUANTITATIVE (NOT AT ARMC): D-Dimer, Quant: 0.29 ug/mL-FEU (ref 0.00–0.48)

## 2014-07-17 LAB — TSH: TSH: 1.51 u[IU]/mL (ref 0.350–4.500)

## 2014-07-17 LAB — HEPARIN LEVEL (UNFRACTIONATED): Heparin Unfractionated: 0.54 IU/mL (ref 0.30–0.70)

## 2014-07-17 LAB — MRSA PCR SCREENING: MRSA by PCR: NEGATIVE

## 2014-07-17 MED ORDER — SODIUM CHLORIDE 0.9 % IV BOLUS (SEPSIS)
1000.0000 mL | Freq: Once | INTRAVENOUS | Status: AC
  Administered 2014-07-17: 1000 mL via INTRAVENOUS

## 2014-07-17 MED ORDER — HEPARIN BOLUS VIA INFUSION
4000.0000 [IU] | Freq: Once | INTRAVENOUS | Status: AC
  Administered 2014-07-17: 4000 [IU] via INTRAVENOUS
  Filled 2014-07-17: qty 4000

## 2014-07-17 MED ORDER — HEPARIN (PORCINE) IN NACL 100-0.45 UNIT/ML-% IJ SOLN
12.0000 [IU]/kg/h | Freq: Once | INTRAMUSCULAR | Status: AC
  Administered 2014-07-17: 12 [IU]/kg/h via INTRAVENOUS
  Filled 2014-07-17: qty 250

## 2014-07-17 MED ORDER — POTASSIUM CHLORIDE CRYS ER 20 MEQ PO TBCR
40.0000 meq | EXTENDED_RELEASE_TABLET | Freq: Once | ORAL | Status: AC
  Administered 2014-07-17: 40 meq via ORAL
  Filled 2014-07-17: qty 2

## 2014-07-17 MED ORDER — SODIUM CHLORIDE 0.9 % IJ SOLN
3.0000 mL | Freq: Two times a day (BID) | INTRAMUSCULAR | Status: DC
  Administered 2014-07-17: 3 mL via INTRAVENOUS

## 2014-07-17 MED ORDER — DILTIAZEM HCL 100 MG IV SOLR
5.0000 mg/h | INTRAVENOUS | Status: DC
  Administered 2014-07-17: 5 mg/h via INTRAVENOUS
  Filled 2014-07-17: qty 100

## 2014-07-17 MED ORDER — ASPIRIN 325 MG PO TABS
325.0000 mg | ORAL_TABLET | Freq: Once | ORAL | Status: AC
  Administered 2014-07-17: 325 mg via ORAL
  Filled 2014-07-17: qty 1

## 2014-07-17 MED ORDER — POTASSIUM CHLORIDE CRYS ER 20 MEQ PO TBCR
20.0000 meq | EXTENDED_RELEASE_TABLET | Freq: Two times a day (BID) | ORAL | Status: DC
  Administered 2014-07-17 – 2014-07-18 (×2): 20 meq via ORAL
  Filled 2014-07-17 (×2): qty 1

## 2014-07-17 MED ORDER — MORPHINE SULFATE 2 MG/ML IJ SOLN: 1.0000 mg | INTRAMUSCULAR | Status: DC | PRN

## 2014-07-17 MED ORDER — DEXTROSE 5 % IV SOLN
5.0000 mg/h | Freq: Once | INTRAVENOUS | Status: AC
  Administered 2014-07-17: 5 mg/h via INTRAVENOUS

## 2014-07-17 MED ORDER — ACETAMINOPHEN 325 MG PO TABS: 650.0000 mg | ORAL_TABLET | Freq: Four times a day (QID) | ORAL | Status: DC | PRN

## 2014-07-17 MED ORDER — HEPARIN (PORCINE) IN NACL 100-0.45 UNIT/ML-% IJ SOLN
1350.0000 [IU]/h | INTRAMUSCULAR | Status: DC
  Administered 2014-07-17 (×2): 1350 [IU]/h via INTRAVENOUS
  Filled 2014-07-17: qty 250

## 2014-07-17 MED ORDER — REGADENOSON 0.4 MG/5ML IV SOLN
0.4000 mg | Freq: Once | INTRAVENOUS | Status: AC
  Administered 2014-07-18: 0.4 mg via INTRAVENOUS
  Filled 2014-07-17: qty 5

## 2014-07-17 MED ORDER — HYDROCODONE-ACETAMINOPHEN 5-325 MG PO TABS: 1.0000 | ORAL_TABLET | ORAL | Status: DC | PRN

## 2014-07-17 MED ORDER — ATORVASTATIN CALCIUM 10 MG PO TABS
10.0000 mg | ORAL_TABLET | Freq: Every day | ORAL | Status: DC
  Administered 2014-07-17: 10 mg via ORAL
  Filled 2014-07-17: qty 1

## 2014-07-17 MED ORDER — DILTIAZEM HCL 60 MG PO TABS
60.0000 mg | ORAL_TABLET | Freq: Four times a day (QID) | ORAL | Status: DC
  Administered 2014-07-17 – 2014-07-18 (×4): 60 mg via ORAL
  Filled 2014-07-17 (×4): qty 1

## 2014-07-17 MED ORDER — FENTANYL CITRATE 0.05 MG/ML IJ SOLN
50.0000 ug | Freq: Once | INTRAMUSCULAR | Status: AC
  Administered 2014-07-17: 50 ug via INTRAVENOUS
  Filled 2014-07-17: qty 2

## 2014-07-17 MED ORDER — MAGNESIUM SULFATE 2 GM/50ML IV SOLN
2.0000 g | Freq: Once | INTRAVENOUS | Status: AC
  Administered 2014-07-17: 2 g via INTRAVENOUS
  Filled 2014-07-17: qty 50

## 2014-07-17 MED ORDER — VITAMIN D (ERGOCALCIFEROL) 1.25 MG (50000 UNIT) PO CAPS
50000.0000 [IU] | ORAL_CAPSULE | ORAL | Status: DC
  Administered 2014-07-17: 50000 [IU] via ORAL
  Filled 2014-07-17 (×2): qty 1

## 2014-07-17 NOTE — Plan of Care (Signed)
Problem: Consults Goal: Skin Care Protocol Initiated - if Braden Score 18 or less If consults are not indicated, leave blank or document N/A  Outcome: Not Applicable Date Met:  56/94/37

## 2014-07-17 NOTE — Progress Notes (Signed)
ANTICOAGULATION CONSULT NOTE - Follow Up Consult  Pharmacy Consult for Heparin Indication: atrial fibrillation  Allergies  Allergen Reactions  . Ace Inhibitors Swelling    Throat swelling; pt was on lisinopril  . Angiotensin Receptor Blockers     Cross reactivity in known ACE I allergy    Patient Measurements: Height: 5' 10.5" (179.1 cm) Weight: 235 lb 11.2 oz (106.913 kg) IBW/kg (Calculated) : 74.15 Heparin Dosing Weight: 97 kg   Vital Signs: Temp: 97.7 F (36.5 C) (11/27 1235) Temp Source: Oral (11/27 1235) BP: 109/68 mmHg (11/27 1235) Pulse Rate: 55 (11/27 1235)  Labs:  Recent Labs  07/17/14 0335 07/17/14 0830 07/17/14 1553  HGB 12.7*  --   --   HCT 38.4*  --   --   PLT 238  --   --   LABPROT  --  13.7  --   INR  --  1.04  --   HEPARINUNFRC  --   --  0.54  CREATININE 1.70*  --   --   TROPONINI <0.30 <0.30  --     Estimated Creatinine Clearance: 52.8 mL/min (by C-G formula based on Cr of 1.7).  Assessment:   Initial heparin level is therapeutic (0.54) on 1350 units/hr.   Noted plan for cardiac cath if EF down; Eliquis later.  Goal of Therapy:  Heparin level 0.3-0.7 units/ml Monitor platelets by anticoagulation protocol: Yes   Plan:   Continue heparin drip at 1350 units/hr.  Next heparin level and CBC in am.  Will follow up plans and timing for switch to Eliquis.  Dennie Fettersgan, Harlie Buening [place], [place] Pager: [phone] 07/17/2014,4:46 PM

## 2014-07-17 NOTE — ED Notes (Signed)
Pt reports around 1 am this morning started having some tightness and palpitations in his chest. Reports acid reflux sensation.  Denies SOB, dizziness or pain radiating.

## 2014-07-17 NOTE — Plan of Care (Signed)
Problem: Phase I Progression Outcomes Goal: Ventricular heart rate < 120/min Outcome: Completed/Met Date Met:  07/17/14 Goal: Anticoagulation Therapy per MD order Outcome: Completed/Met Date Met:  07/17/14 Goal: Heart rate or rhythm control medication Outcome: Completed/Met Date Met:  07/17/14 Goal: Pain controlled with appropriate interventions Outcome: Not Applicable Date Met:  20/35/59 Goal: Hemodynamically stable Outcome: Completed/Met Date Met:  07/17/14

## 2014-07-17 NOTE — Progress Notes (Signed)
ANTICOAGULATION CONSULT NOTE - Initial Consult  Pharmacy Consult for Heparin Indication: Chest pain and Afib  Allergies  Allergen Reactions  . Ace Inhibitors Swelling    Throat swelling; pt was on lisinopril  . Angiotensin Receptor Blockers     Cross reactivity in known ACE I allergy    Patient Measurements: Height: 5' 10.5" (179.1 cm) Weight: 235 lb 11.2 oz (106.913 kg) IBW/kg (Calculated) : 74.15 Heparin Dosing Weight: 97 kg  Vital Signs: Temp: 98.1 F (36.7 C) (11/27 0815) Temp Source: Oral (11/27 0815) BP: 110/69 mmHg (11/27 0815) Pulse Rate: 89 (11/27 0815)  Labs:  Recent Labs  07/17/14 0335  HGB 12.7*  HCT 38.4*  PLT 238  CREATININE 1.70*  TROPONINI <0.30    Estimated Creatinine Clearance: 52.8 mL/min (by C-G formula based on Cr of 1.7).   Medical History: Past Medical History  Diagnosis Date  . Hypertension   . High cholesterol     Assessment: 68yo male with chest pain. Pt reports 3-4 days of SOB and DOE. Trop neg x 1. Found to be in Afib, to start heparin. Not on anticoag prior to admission.  H&H 12.7/38.4, PLT 238, no bleeding noted  Goal of Therapy:  Heparin level 0.3-0.7 units/ml Monitor platelets by anticoagulation protocol: Yes   Plan:  Give 4000 units bolus x 1 Start heparin infusion at 1350 units/hr Check heparin level in 6 hours and daily while on heparin Continue to monitor H&H and platelets  Waynette Butteryegan K. Symeon Puleo, PharmD Clinical Pharmacy Resident Pager: [phone] 07/17/2014 8:44 AM

## 2014-07-17 NOTE — ED Provider Notes (Signed)
CSN: [id number]     Arrival date & time 07/17/14  0315 History   First MD Initiated Contact with Patient 07/17/14 0326     Chief Complaint  Patient presents with  . Chest Pain     (Consider location/radiation/quality/duration/timing/severity/associated sxs/prior Treatment) Patient is a 68 y.o. male presenting with chest pain. The history is provided by the patient.  Chest Pain Pain location:  Substernal area Pain quality: dull   Pain radiates to:  Neck, L shoulder and R arm Pain radiates to the back: no   Pain severity:  Moderate Onset quality:  Gradual Timing:  Constant Progression:  Unchanged Chronicity:  New Context: at rest   Context comment:  Had 3-4 days of SOB and DOE Relieved by:  Nothing Worsened by:  Nothing tried Ineffective treatments:  None tried Associated symptoms: shortness of breath   Associated symptoms: no abdominal pain, no cough, no fever, no syncope, not vomiting and no weakness   Risk factors: high cholesterol and hypertension   [place] but no leg pain at this time  Past Medical History  Diagnosis Date  . Hypertension   . High cholesterol    Past Surgical History  Procedure Laterality Date  . Tonsillectomy     History reviewed. No pertinent family history. History  Substance Use Topics  . Smoking status: Current Every Day Smoker  . Smokeless tobacco: Not on file  . Alcohol Use: Yes    Review of Systems  Constitutional: Negative for fever.  Respiratory: Positive for shortness of breath. Negative for cough and wheezing.   Cardiovascular: Positive for chest pain. Negative for leg swelling and syncope.  Gastrointestinal: Negative for vomiting and abdominal pain.  Neurological: Negative for weakness.  All other systems reviewed and are negative.     Allergies  Ace inhibitors and Angiotensin receptor blockers  Home Medications   Prior to Admission medications   Medication Sig Start Date End Date  Taking? Authorizing Provider  acetaminophen (TYLENOL) 325 MG tablet Take 650 mg by mouth every 6 (six) hours as needed for pain.    Historical Provider, MD  amLODipine (NORVASC) 10 MG tablet Take 10 mg by mouth daily.    Historical Provider, MD  atorvastatin (LIPITOR) 20 MG tablet Take 10 mg by mouth daily.    Historical Provider, MD  diphenhydrAMINE (BENADRYL) 25 MG tablet Take 1/2 tab or 12.5 mg 3 times daily for recent allergic reaction 03/17/13 03/21/13  Russella DarAllison L Ellis, NP  EPINEPHrine (EPIPEN) 0.3 mg/0.3 mL SOAJ Inject 0.3 mLs (0.3 mg total) into the muscle once. 03/17/13   Russella DarAllison L Ellis, NP  famotidine (PEPCID) 20 MG tablet Take 1 tablet (20 mg total) by mouth 2 (two) times daily. 03/17/13 03/21/13  Russella DarAllison L Ellis, NP  potassium chloride SA (K-DUR,KLOR-CON) 20 MEQ tablet Take 20 mEq by mouth 2 (two) times daily.    Historical Provider, MD  predniSONE (DELTASONE) 10 MG tablet Take 5 tabs daily beginning 7/28 then 4 tabs on 7/29 then 3 tabs on 7/30 then 2 tabs on 7/31 then 1 tab on 8/1 then stop 03/17/13   Russella DarAllison L Ellis, NP  triamterene-hydrochlorothiazide (MAXZIDE) 75-50 MG per tablet Take 1 tablet by mouth daily.    Historical Provider, MD  Vitamin D, Ergocalciferol, (DRISDOL) 50000 UNITS CAPS Take 50,000 Units by mouth every 7 (seven) days. Thursdays    Historical Provider, MD   BP 110/75 mmHg  Pulse 104  Temp(Src) 98.2 F (36.8 C) (Oral)  Resp 18  Ht 5\' 10"  (1.778 m)  Wt 235 lb (106.595 kg)  BMI 33.72 kg/m2  SpO2 98% Physical Exam  Constitutional: He is oriented to person, place, and time. He appears well-developed and well-nourished.  HENT:  Head: Normocephalic and atraumatic.  Mouth/Throat: Oropharynx is clear and moist.  Eyes: Conjunctivae are normal. Pupils are equal, round, and reactive to light.  Neck: Normal range of motion. Neck supple.  Cardiovascular: An irregularly irregular rhythm present. Tachycardia present.   Pulmonary/Chest: Effort normal and breath sounds normal.  No respiratory distress. He has no wheezes. He has no rales.  Abdominal: Soft. Bowel sounds are normal. There is no tenderness. There is no rebound and no guarding.  Musculoskeletal: Normal range of motion. He exhibits no edema.  Neurological: He is alert and oriented to person, place, and time. He has normal reflexes.  Skin: Skin is warm and dry. He is not diaphoretic.  Psychiatric: He has a normal mood and affect.    ED Course  Procedures (including critical care time) Labs Review Labs Reviewed  CBC WITH DIFFERENTIAL - Abnormal; Notable for the following:    Hemoglobin 12.7 (*)    HCT 38.4 (*)    RDW 15.7 (*)    Monocytes Relative 20 (*)    Monocytes Absolute 1.2 (*)    All other components within normal limits  COMPREHENSIVE METABOLIC PANEL - Abnormal; Notable for the following:    Potassium 3.5 (*)    Glucose, Bld 126 (*)    BUN 26 (*)    Creatinine, Ser 1.70 (*)    Total Bilirubin <0.2 (*)    GFR calc non Af Amer 40 (*)    GFR calc Af Amer 47 (*)    All other components within normal limits  TROPONIN I  D-DIMER, QUANTITATIVE    Imaging Review Dg Chest Portable 1 View  07/17/2014   CLINICAL DATA:  Chest pain  EXAM: PORTABLE CHEST - 1 VIEW  COMPARISON:  12/30/2010  FINDINGS: Borderline cardiomegaly, with size accentuated by portable technique. Unchanged mild aortic tortuosity. There is no edema, consolidation, effusion, or pneumothorax. No osseous findings to explain chest pain.  IMPRESSION: No active disease.   Electronically Signed   By: Tiburcio PeaJonathan  Watts M.D.   On: 07/17/2014 04:11     EKG Interpretation None      MDM   Final diagnoses:  Pain  Atrial fibrillation, new onset   Medications  diltiazem (CARDIZEM) 100 mg in dextrose 5 % 100 mL (1 mg/mL) infusion (not administered)  heparin ADULT infusion 100 units/mL (25000 units/250 mL) (not administered)  fentaNYL (SUBLIMAZE) injection 50 mcg (not administered)  sodium chloride 0.9 % bolus 1,000 mL (1,000 mLs  Intravenous New Bag/Given 07/17/14 0436)  aspirin tablet 325 mg (325 mg Oral Given 07/17/14 0436)   . Case d/w Dr. Clyde LundborgNiu admit to [facility], pain medication and anticoagulation  Jaryiah Mehlman K Kenn Rekowski-Rasch, MD 07/17/14 [phone]

## 2014-07-17 NOTE — Progress Notes (Signed)
  Echocardiogram 2D Echocardiogram has been performed.  Delcie RochENNINGTON, Pricilla Moehle 07/17/2014, 3:51 PM

## 2014-07-17 NOTE — ED Notes (Signed)
Upon going to pull patient from lobby, patient had walked back outside to get his cell phone from his car.

## 2014-07-17 NOTE — Consult Note (Signed)
CARDIOLOGY CONSULT NOTE   Patient ID: Richard Sampson MRN: [id number] DOB/AGE: 1946-06-26 68 y.o.  Admit date: 07/17/2014  Primary Physician   Gwynneth AlimentSANDERS,ROBYN N, MD Primary Cardiologist   New Reason for Consultation   New onset afib  HPI: Richard Sampson is a 68 y.o. male with a history of HTN, HLD, tobacco abuse and no past cardiac history who [facility] for chest pain and was found to be in afib with RVR.  He was in his usual state of health until last night after dinner when he developed some palpitations, with funny feeling in his chest, SOB and coughing. He also noted a similar episode after taking his granddaughter on a walk a couple weeks ago and while doing yard work recently. He feels worse when laying on his right side. Currently he is feeling improved with rate control   He is pretty active and retired from [employer] recently. He said he has no past cardiac disease but was told he had a heart murmur as a young man. He was told that he may have had an irregular heart rate in the past remotely. He had a stress test about 4-5 years ago but is unsure why this was ordered. No family history of heart disease. No blood in his urine or stool. He smokes 3-4 cigars a night and drinks only occasionally.       Past Medical History  Diagnosis Date  . Hypertension   . High cholesterol      Past Surgical History  Procedure Laterality Date  . Tonsillectomy      Allergies  Allergen Reactions  . Ace Inhibitors Swelling    Throat swelling; pt was on lisinopril  . Angiotensin Receptor Blockers     Cross reactivity in known ACE I allergy    I have reviewed the patient's current medications . atorvastatin  10 mg Oral q1800  . potassium chloride SA  20 mEq Oral BID  . sodium chloride  3 mL Intravenous Q12H  . Vitamin D (Ergocalciferol)  50,000 Units Oral Q Thu   . diltiazem (CARDIZEM) infusion 5 mg/hr (07/17/14 0700)  . heparin 1,350 Units/hr  (07/17/14 0907)   acetaminophen, HYDROcodone-acetaminophen, morphine injection  Prior to Admission medications   Medication Sig Start Date End Date Taking? Authorizing Provider  acetaminophen (TYLENOL) 325 MG tablet Take 650 mg by mouth every 6 (six) hours as needed for pain.    Historical Provider, MD  amLODipine (NORVASC) 10 MG tablet Take 10 mg by mouth daily.    Historical Provider, MD  atorvastatin (LIPITOR) 20 MG tablet Take 10 mg by mouth daily.    Historical Provider, MD  diphenhydrAMINE (BENADRYL) 25 MG tablet Take 1/2 tab or 12.5 mg 3 times daily for recent allergic reaction 03/17/13 03/21/13  Russella DarAllison L Ellis, NP  EPINEPHrine (EPIPEN) 0.3 mg/0.3 mL SOAJ Inject 0.3 mLs (0.3 mg total) into the muscle once. 03/17/13   Russella DarAllison L Ellis, NP  famotidine (PEPCID) 20 MG tablet Take 1 tablet (20 mg total) by mouth 2 (two) times daily. 03/17/13 03/21/13  Russella DarAllison L Ellis, NP  potassium chloride SA (K-DUR,KLOR-CON) 20 MEQ tablet Take 20 mEq by mouth 2 (two) times daily.    Historical Provider, MD  predniSONE (DELTASONE) 10 MG tablet Take 5 tabs daily beginning 7/28 then 4 tabs on 7/29 then 3 tabs on 7/30 then 2 tabs on 7/31 then 1 tab on 8/1 then stop 03/17/13  Russella DarAllison L Ellis, NP  triamterene-hydrochlorothiazide (MAXZIDE) 75-50 MG per tablet Take 1 tablet by mouth daily.    Historical Provider, MD  Vitamin D, Ergocalciferol, (DRISDOL) 50000 UNITS CAPS Take 50,000 Units by mouth every 7 (seven) days. Thursdays    Historical Provider, MD     History   Social History  . Marital Status: Married    Spouse Name: N/A    Number of Children: N/A  . Years of Education: N/A   Occupational History  . Not on file.   Social History Main Topics  . Smoking status: Current Every Day Smoker  . Smokeless tobacco: Not on file  . Alcohol Use: Yes  . Drug Use: Not on file  . Sexual Activity: Not on file   Other Topics Concern  . Not on file   Social History Narrative    No family status information on  file.   History reviewed. No pertinent family history.   ROS:  Full 14 point review of systems complete and found to be negative unless listed above.  Physical Exam: Blood pressure 110/69, pulse 89, temperature 98.1 F (36.7 C), temperature source Oral, resp. rate 18, height 5' 10.5" (1.791 m), weight 235 lb 11.2 oz (106.913 kg), SpO2 96 %.  General: Well developed, well nourished, male in no acute distress Head: Eyes PERRLA, No xanthomas.   Normocephalic and atraumatic, oropharynx without edema or exudate. :  Lungs: Lung sounds clear.  Heart:  Heart irregular rate and rhythm with S1, S2  murmur. pulses are 2+ extrem.   Neck: No carotid bruits. No lymphadenopathy. No JVD. Abdomen: Bowel sounds present, abdomen soft and non-tender without masses or hernias noted. Msk:  No spine or cva tenderness. No weakness, no joint deformities or effusions. Extremities: No clubbing or cyanosis.  No edema.  Neuro: Alert and oriented X 3. No focal deficits noted. Psych:  Good affect, responds appropriately Skin: No rashes or lesions noted.  Labs:   Lab Results  Component Value Date   WBC 6.1 07/17/2014   HGB 12.7* 07/17/2014   HCT 38.4* 07/17/2014   MCV 86.9 07/17/2014   PLT 238 07/17/2014    Recent Labs  07/17/14 0830  INR 1.04    Recent Labs Lab 07/17/14 0335  NA 143  K 3.5*  CL 104  CO2 25  BUN 26*  CREATININE 1.70*  CALCIUM 9.5  PROT 7.6  BILITOT <0.2*  ALKPHOS 86  ALT 17  AST 19  GLUCOSE 126*  ALBUMIN 3.6     Recent Labs  07/17/14 0335 07/17/14 0830  TROPONINI <0.30 <0.30    Lab Results  Component Value Date   DDIMER 0.29 07/17/2014   No results found for: LIPASE, AMYLASE TSH  Date/Time Value Ref Range Status  07/17/2014 08:30 AM 1.510 0.350 - 4.500 uIU/mL Final     Echo: pending  ECG; afib with RVR  Radiology:  Dg Chest Portable 1 View  07/17/2014   CLINICAL DATA:  Chest pain  EXAM: PORTABLE CHEST - 1 VIEW  COMPARISON:  12/30/2010  FINDINGS:  Borderline cardiomegaly, with size accentuated by portable technique. Unchanged mild aortic tortuosity. There is no edema, consolidation, effusion, or pneumothorax. No osseous findings to explain chest pain.  IMPRESSION: No active disease.   Electronically Signed   By: Tiburcio PeaJonathan  Watts M.D.   On: 07/17/2014 04:11    ASSESSMENT AND PLAN:    Principal Problem:   A-fib Active Problems:   Essential hypertension, benign   Hypokalemia   Angio-edema  Chest pain   HLD (hyperlipidemia)  JSHAWN HURTA is a 68 y.o. male with a history of HTN, HLD, tobacco abuse and no past cardiac history who presetned to [facility] at 3am this morning for chest pain and was found to be in afib with RVR.  New onset atrial fibrillation with RVR- placed on IV heparin and dilt gtt in ED. Rates with much better control -- TSH normal. HgA1c pending -- Will discontinue dilt gtt and start PO Dilt 60mg  q6hrs -- CHADSVASc score of at least 2 (HTN and age). HgA1c pending -- Continue heparin gtt for now. If ECHO and nuclear stress test tomorrow return normal, plan to start Eliquis for long term anticoagulation -- He does have RFs for CAD, will proceed with ECHO today. If ECHO returns with reduced LV function or WMAs will plan for LHC. If ECHO normal plan to proceed with Steffanie Dunn tomorrow. If this is normal he can be discharged home on rate control with plans for outpatient DCCV in 3-4 weeks if he does not convert spontaneously.  HTN- continue diltiazem for now.   HLD- continue statin  AKI- creat mildly elevated above baseline fron 2014. 1.22--> 1.7. Continue to monitor.   Hyperglycemia- will order HGA1c for further risk stratification pending  Signed: Allena Katz 07/17/2014 11:37 AM  Pager [phone]  Co-Sign MD  Patient seen and examined with Jennette Banker, PA-C. We discussed all aspects of the encounter. I agree with the assessment and plan as stated above.    Mr. Jocson is 68 y/o with  tobacco use and HTN admitted with newly-diagnosed symptomatic AF (duration unclear). Still somewhat symptomatic despite rate control with IV cardizem. Has had some exertional symptoms recently and need to exclude underlying CAD prior to making definitive plans with AF.  Will check echo. If EF normal with proceed with myoview in am. If EF down, cath either later today or Monday. Once decision about cath made will switch heparin to Eliquis 5 bid. If symptoms tolerable can plan DC-CV in 4 weeks otherwise need to consider TEE/DC-CV. Change cardizem to po. Will need outpatient sleep study. Counseled on need to stop smoking.   Daniel Bensimhon,MD 12:56 PM

## 2014-07-17 NOTE — ED Notes (Signed)
MD ordered to hold diltiazem drip at this time.

## 2014-07-17 NOTE — H&P (Signed)
[facility] History and Physical  Richard BirkJames E Nabozny ZOX:[id number] DOB: 12/07/1945 DOA: 07/17/2014  Referring physician: EDP PCP: Gwynneth AlimentSANDERS,ROBYN N, MD   Chief Complaint: Palpitations  HPI: Richard Sampson is a 68 y.o. male with past medical history of hypertension and hyperlipidemia patient [facility] night for palpitations. Patient mentioned about 10:30 last night he developed some palpitations, with funny feeling in his chest, denies chest pain, denies shortness of breath. Does not have history of CHF or CAD. In the ED EKG showed atrial fibrillation with rapid ventricular response, patient placed on Cardizem and heparin drips and transferred to [facility] for further evaluation.   Review of Systems:  Constitutional: negative for anorexia, fevers and sweats Eyes: negative for irritation, redness and visual disturbance Ears, nose, mouth, throat, and face: negative for earaches, epistaxis, nasal congestion and sore throat Respiratory: negative for cough, dyspnea on exertion, sputum and wheezing Cardiovascular: Palpitations Gastrointestinal: negative for abdominal pain, constipation, diarrhea, melena, nausea and vomiting Genitourinary:negative for dysuria, frequency and hematuria Hematologic/lymphatic: negative for bleeding, easy bruising and lymphadenopathy Musculoskeletal:negative for arthralgias, muscle weakness and stiff joints Neurological: negative for coordination problems, gait problems, headaches and weakness Endocrine: negative for diabetic symptoms including polydipsia, polyuria and weight loss Allergic/Immunologic: negative for anaphylaxis, hay fever and urticaria  Past Medical History  Diagnosis Date  . Hypertension   . High cholesterol    Past Surgical History  Procedure Laterality Date  . Tonsillectomy     Social History:   reports that he has been smoking.  He does not have any smokeless tobacco history on file. He reports that he  drinks alcohol. His drug history is not on file.  Allergies  Allergen Reactions  . Ace Inhibitors Swelling    Throat swelling; pt was on lisinopril  . Angiotensin Receptor Blockers     Cross reactivity in known ACE I allergy    History reviewed. No pertinent family history.   Prior to Admission medications   Medication Sig Start Date End Date Taking? Authorizing Provider  acetaminophen (TYLENOL) 325 MG tablet Take 650 mg by mouth every 6 (six) hours as needed for pain.    Historical Provider, MD  amLODipine (NORVASC) 10 MG tablet Take 10 mg by mouth daily.    Historical Provider, MD  atorvastatin (LIPITOR) 20 MG tablet Take 10 mg by mouth daily.    Historical Provider, MD  diphenhydrAMINE (BENADRYL) 25 MG tablet Take 1/2 tab or 12.5 mg 3 times daily for recent allergic reaction 03/17/13 03/21/13  Russella DarAllison L Ellis, NP  EPINEPHrine (EPIPEN) 0.3 mg/0.3 mL SOAJ Inject 0.3 mLs (0.3 mg total) into the muscle once. 03/17/13   Russella DarAllison L Ellis, NP  famotidine (PEPCID) 20 MG tablet Take 1 tablet (20 mg total) by mouth 2 (two) times daily. 03/17/13 03/21/13  Russella DarAllison L Ellis, NP  potassium chloride SA (K-DUR,KLOR-CON) 20 MEQ tablet Take 20 mEq by mouth 2 (two) times daily.    Historical Provider, MD  predniSONE (DELTASONE) 10 MG tablet Take 5 tabs daily beginning 7/28 then 4 tabs on 7/29 then 3 tabs on 7/30 then 2 tabs on 7/31 then 1 tab on 8/1 then stop 03/17/13   Russella DarAllison L Ellis, NP  triamterene-hydrochlorothiazide (MAXZIDE) 75-50 MG per tablet Take 1 tablet by mouth daily.    Historical Provider, MD  Vitamin D, Ergocalciferol, (DRISDOL) 50000 UNITS CAPS Take 50,000 Units by mouth every 7 (seven) days. Thursdays    Historical Provider, MD   Physical Exam: Filed Vitals:  07/17/14 0815  BP: 110/69  Pulse: 89  Temp: 98.1 F (36.7 C)  Resp: 18   Constitutional: Oriented to person, place, and time. Well-developed and well-nourished. Cooperative.  Head: Normocephalic and atraumatic.  Nose: Nose normal.    Mouth/Throat: Uvula is midline, oropharynx is clear and moist and mucous membranes are normal.  Eyes: Conjunctivae and EOM are normal. Pupils are equal, round, and reactive to light.  Neck: Trachea normal and normal range of motion. Neck supple.  Cardiovascular: Normal rate, regular rhythm, S1 normal, S2 normal, normal heart sounds and intact distal pulses.   Pulmonary/Chest: Effort normal and breath sounds normal.  Abdominal: Soft. Bowel sounds are normal. There is no hepatosplenomegaly. There is no tenderness.  Musculoskeletal: Normal range of motion.  Neurological: Alert and oriented to person, place, and time. Has normal strength. No cranial nerve deficit or sensory deficit.  Skin: Skin is warm, dry and intact.  Psychiatric: Has a normal mood and affect. Speech is normal and behavior is normal.   Labs on Admission:  Basic Metabolic Panel:  Recent Labs Lab 07/17/14 0335  NA 143  K 3.5*  CL 104  CO2 25  GLUCOSE 126*  BUN 26*  CREATININE 1.70*  CALCIUM 9.5   Liver Function Tests:  Recent Labs Lab 07/17/14 0335  AST 19  ALT 17  ALKPHOS 86  BILITOT <0.2*  PROT 7.6  ALBUMIN 3.6   No results for input(s): LIPASE, AMYLASE in the last 168 hours. No results for input(s): AMMONIA in the last 168 hours. CBC:  Recent Labs Lab 07/17/14 0335  WBC 6.1  NEUTROABS 3.2  HGB 12.7*  HCT 38.4*  MCV 86.9  PLT 238   Cardiac Enzymes:  Recent Labs Lab 07/17/14 0335 07/17/14 0830  TROPONINI <0.30 <0.30    BNP (last 3 results) No results for input(s): PROBNP in the last 8760 hours. CBG: No results for input(s): GLUCAP in the last 168 hours.  Radiological Exams on Admission: Dg Chest Portable 1 View  07/17/2014   CLINICAL DATA:  Chest pain  EXAM: PORTABLE CHEST - 1 VIEW  COMPARISON:  12/30/2010  FINDINGS: Borderline cardiomegaly, with size accentuated by portable technique. Unchanged mild aortic tortuosity. There is no edema, consolidation, effusion, or pneumothorax. No  osseous findings to explain chest pain.  IMPRESSION: No active disease.   Electronically Signed   By: Tiburcio PeaJonathan  Watts M.D.   On: 07/17/2014 04:11    EKG: Independently reviewed.   Assessment/Plan Principal Problem:   A-fib Active Problems:   Essential hypertension, benign   Hypokalemia   Angio-edema   Chest pain   HLD (hyperlipidemia)    Atrial fibrillation with RVR Highest rate was about 110, patient placed in Cardizem drip as well as heparin drip. Check TSH, 2-D echocardiogram and rule out ACS. Cardiology consulted for inpatient help and outpatient follow-up.  Hypertension Is on amlodipine and Maxzide, hold both. Patient is on Cardizem drip, likely will discontinue amlodipine to be replaced by Cardizem. Stable.  Hypokalemia Seems to be ongoing issue as patient is on potassium supplements at home. Patient also on Maxzide. Repleted with oral supplements, check potassium in the morning, given dose of magnesium.  Hyperlipidemia Check fasting lipid profile in the morning, continue Lipitor.  Code Status: Full code Family Communication: Plan discussed with the patient. Disposition Plan: Inpatient  Time spent: 70 minutes  [facility] LLC[facility]MAHI,Alexio Sroka A [facility] Pager [phone]

## 2014-07-18 ENCOUNTER — Encounter (HOSPITAL_COMMUNITY): Payer: Self-pay | Admitting: Internal Medicine

## 2014-07-18 ENCOUNTER — Inpatient Hospital Stay (HOSPITAL_COMMUNITY): Payer: BC Managed Care – PPO

## 2014-07-18 DIAGNOSIS — E876 Hypokalemia: Secondary | ICD-10-CM

## 2014-07-18 DIAGNOSIS — N179 Acute kidney failure, unspecified: Secondary | ICD-10-CM | POA: Diagnosis present

## 2014-07-18 DIAGNOSIS — I480 Paroxysmal atrial fibrillation: Secondary | ICD-10-CM | POA: Diagnosis present

## 2014-07-18 DIAGNOSIS — I4891 Unspecified atrial fibrillation: Principal | ICD-10-CM

## 2014-07-18 DIAGNOSIS — R079 Chest pain, unspecified: Secondary | ICD-10-CM

## 2014-07-18 LAB — BASIC METABOLIC PANEL
Anion gap: 13 (ref 5–15)
BUN: 20 mg/dL (ref 6–23)
CO2: 24 mEq/L (ref 19–32)
Calcium: 8.9 mg/dL (ref 8.4–10.5)
Chloride: 108 mEq/L (ref 96–112)
Creatinine, Ser: 1.55 mg/dL — ABNORMAL HIGH (ref 0.50–1.35)
GFR calc Af Amer: 52 mL/min — ABNORMAL LOW (ref >90)
GFR calc non Af Amer: 45 mL/min — ABNORMAL LOW (ref >90)
Glucose, Bld: 114 mg/dL — ABNORMAL HIGH (ref 70–99)
Potassium: 3.4 mEq/L — ABNORMAL LOW (ref 3.7–5.3)
Sodium: 145 mEq/L (ref 137–147)

## 2014-07-18 LAB — CBC
HCT: 34.1 % — ABNORMAL LOW (ref 39.0–52.0)
Hemoglobin: 11.1 g/dL — ABNORMAL LOW (ref 13.0–17.0)
MCH: 27.9 pg (ref 26.0–34.0)
MCHC: 32.6 g/dL (ref 30.0–36.0)
MCV: 85.7 fL (ref 78.0–100.0)
Platelets: 228 10*3/uL (ref 150–400)
RBC: 3.98 MIL/uL — ABNORMAL LOW (ref 4.22–5.81)
RDW: 15.9 % — ABNORMAL HIGH (ref 11.5–15.5)
WBC: 4.4 10*3/uL (ref 4.0–10.5)

## 2014-07-18 LAB — HEPARIN LEVEL (UNFRACTIONATED): Heparin Unfractionated: 0.3 IU/mL (ref 0.30–0.70)

## 2014-07-18 LAB — MAGNESIUM: Magnesium: 2.2 mg/dL (ref 1.5–2.5)

## 2014-07-18 MED ORDER — REGADENOSON 0.4 MG/5ML IV SOLN
INTRAVENOUS | Status: AC
  Administered 2014-07-18: 0.4 mg via INTRAVENOUS
  Filled 2014-07-18: qty 5

## 2014-07-18 MED ORDER — DILTIAZEM HCL ER COATED BEADS 240 MG PO CP24: 240.0000 mg | ORAL_CAPSULE | Freq: Every day | ORAL | Status: DC

## 2014-07-18 MED ORDER — TECHNETIUM TC 99M SESTAMIBI - CARDIOLITE
30.0000 | Freq: Once | INTRAVENOUS | Status: AC | PRN
  Administered 2014-07-18: 10:00:00 30 via INTRAVENOUS

## 2014-07-18 MED ORDER — DILTIAZEM HCL ER COATED BEADS 240 MG PO CP24
240.0000 mg | ORAL_CAPSULE | Freq: Every day | ORAL | Status: DC
  Administered 2014-07-18: 240 mg via ORAL
  Filled 2014-07-18: qty 1

## 2014-07-18 MED ORDER — REGADENOSON 0.4 MG/5ML IV SOLN: 0.4000 mg | Freq: Once | INTRAVENOUS | Status: DC

## 2014-07-18 MED ORDER — APIXABAN 5 MG PO TABS: 5.0000 mg | ORAL_TABLET | Freq: Two times a day (BID) | ORAL | Status: DC

## 2014-07-18 MED ORDER — EXERCISE FOR HEART AND HEALTH BOOK
Freq: Once | Status: AC
  Administered 2014-07-18: 1
  Filled 2014-07-18 (×3): qty 1

## 2014-07-18 MED ORDER — OFF THE BEAT BOOK
Freq: Once | Status: AC
  Administered 2014-07-18: 1
  Filled 2014-07-18: qty 1

## 2014-07-18 MED ORDER — TECHNETIUM TC 99M SESTAMIBI GENERIC - CARDIOLITE
10.0000 | Freq: Once | INTRAVENOUS | Status: AC | PRN
  Administered 2014-07-18: 10 via INTRAVENOUS

## 2014-07-18 MED ORDER — APIXABAN 5 MG PO TABS
5.0000 mg | ORAL_TABLET | Freq: Two times a day (BID) | ORAL | Status: DC
  Administered 2014-07-18: 5 mg via ORAL
  Filled 2014-07-18: qty 1

## 2014-07-18 NOTE — Discharge Summary (Addendum)
Physician Discharge Summary  Richard Sampson HYQ:[id number] DOB: [birth year] DOA: 07/17/2014  PCP: Gwynneth AlimentSANDERS,ROBYN N, MD  Admit date: 07/17/2014 Discharge date: 07/18/2014  Time spent: 35 minutes  Recommendations for Outpatient Follow-up:  Discharge home with outpatient PCP follow-up in 1 week. Please check repeat BMET given AKI. Follow-up with Dr. Graciela HusbandsKlein in 4 weeks  Discharge Diagnoses:  Principal Problem:   Atrial fibrillation with RVR   Active Problems:   Essential hypertension, benign   Hypokalemia   Angio-edema   Chest pain   HLD (hyperlipidemia)   Acute kidney injury   Discharge Condition: Fair  Diet recommendation: Heart healthy  Filed Weights   07/17/14 0326 07/17/14 0633 07/18/14 0442  Weight: 106.595 kg (235 lb) 106.913 kg (235 lb 11.2 oz) 107.593 kg (237 lb 3.2 oz)    History of present illness:  Please refer to admission H&P for details, but in brief, 68 y.o. male with past medical history of hypertension, hyperlipidemia and tobacco use patient presented to [facility] last night for palpitations. During the night prior to admission, he developed some palpitations, with funny feeling in his chest, denies chest pain ( but reports recent precordial chest discomfort) , denies shortness of breath. Does not have history of CHF or CAD. In the ED EKG showed atrial fibrillation with rapid ventricular response, patient placed on Cardizem and heparin drip and transferred to [facility] for further evaluation.   Hospital Course:  New onset Atrial fibrillation with RVR Patient initially placed on Cardizem drip and heparin drip. Pathology was consulted. 2-D echo showed normal EF with no wall motion abnormality. Patient soon converted to sinus rhythm and was placed on oral Cardizem 60 mg every 6 hours. -Given history of recent exertional chest discomfort symptoms patient underwent a lexiscan myoview today which showed a left ventricular EF of 65% with no reversible  ischemia. . -Patient's CHADSVASc score is 2 including hypertension and age, and was recommended for long-term oral anticoagulation with Eliquis. (30 day card provided by case manager) -Patient will be discharged on long-acting Cardizem 240 mg daily. -pt will follow up with Dr Graciela HusbandsKlein in 4 weeks.  Hypertension Stable. Resume home medications added long-acting Cardizem  Hyperlipidemia Continue statin  Acute kidney injury  presented with creatinine of 1.7 now improved to 1.5 this morning. Did not have labs since 2014 and needs to follow-up with his PCP  Hypokalemia Replenished continue home potassium supplement  PFO on 2 D echo  will follow with cardiology. Recommends possible limited bubble study as outpt  Tobacco use Patient reports smoking cigars on daily basis and has been counseled on cessation.   Patient today is asymptomatic without any chest discomfort or palpitations. He is stable on the monitor. Patient can be discharged home with outpatient follow-up with PCP and cardiology  Procedures:  2-D echo  Stress test  Consultations:  Cardiology  Discharge Exam: Filed Vitals:   07/18/14 0930  BP: 121/78  Pulse:   Temp:   Resp:     General: Elderly male in no acute distress HEENT: No pallor, moist oral mucosa Chest: Clear to auscultation bilaterally, no added sounds CVS: Normal S1 and S2, no murmurs Abdomen: Soft, nondistended, nontender, bowel sounds present Extremities: Warm, no edema CNS: Alert and oriented   Discharge Instructions You were cared for by a hospitalist during your hospital stay. If you have any questions about your discharge medications or the care you received while you were in the hospital after you are discharged, you can call  the unit and asked to speak with the hospitalist on call if the hospitalist that took care of you is not available. Once you are discharged, your primary care physician will handle any further medical issues. Please note  that NO REFILLS for any discharge medications will be authorized once you are discharged, as it is imperative that you return to your primary care physician (or establish a relationship with a primary care physician if you do not have one) for your aftercare needs so that they can reassess your need for medications and monitor your lab values.   Current Discharge Medication List    START taking these medications   Details  apixaban (ELIQUIS) 5 MG TABS tablet Take 1 tablet (5 mg total) by mouth 2 (two) times daily. Qty: 60 tablet, Refills: 0    diltiazem (CARDIZEM CD) 240 MG 24 hr capsule Take 1 capsule (240 mg total) by mouth daily. Qty: 30 capsule, Refills: 2      CONTINUE these medications which have NOT CHANGED   Details  acetaminophen (TYLENOL) 325 MG tablet Take 650 mg by mouth every 6 (six) hours as needed (pain).     amLODipine (NORVASC) 10 MG tablet Take 10 mg by mouth every evening.     ammonium lactate (LAC-HYDRIN) 12 % lotion Apply 1 application topically 2 (two) times daily.    atorvastatin (LIPITOR) 20 MG tablet Take 20 mg by mouth every evening.     EPINEPHrine (EPIPEN) 0.3 mg/0.3 mL SOAJ Inject 0.3 mLs (0.3 mg total) into the muscle once. Qty: 1 Device, Refills: 1    potassium chloride SA (K-DUR,KLOR-CON) 20 MEQ tablet Take 20 mEq by mouth 2 (two) times daily. Morning and evening    triamterene-hydrochlorothiazide (MAXZIDE) 75-50 MG per tablet Take 1 tablet by mouth every evening.     Vitamin D, Ergocalciferol, (DRISDOL) 50000 UNITS CAPS Take 50,000 Units by mouth every 7 (seven) days. Mondays    gabapentin (NEURONTIN) 300 MG capsule Take 300 mg by mouth 3 (three) times daily.      STOP taking these medications     diphenhydrAMINE (BENADRYL) 25 MG tablet      famotidine (PEPCID) 20 MG tablet      predniSONE (DELTASONE) 10 MG tablet        Allergies  Allergen Reactions  . Ace Inhibitors Swelling    Throat swelling; pt was on lisinopril  . Angiotensin  Receptor Blockers Swelling    Cross reactivity in known ACE I allergy  . Lisinopril Swelling    Throat swelling  . Losartan Other (See Comments)    Doctor told pt not to take   Follow-up Information    Follow up with Sherryl Manges, MD In 4 weeks.   Specialty:  Cardiology   Why:  Office will call   Contact information:   [address] [place] [address] [phone]       Follow up with Gwynneth Aliment, MD In 1 week.   Specialty:  Internal Medicine   Contact information:   [address] [place] [address] [phone]        The results of significant diagnostics from this hospitalization (including imaging, microbiology, ancillary and laboratory) are listed below for reference.    Significant Diagnostic Studies: Nm Myocar Multi W/spect W/wall Motion / Ef  07/18/2014   CLINICAL DATA:  Chest pain  EXAM: MYOCARDIAL IMAGING WITH SPECT (REST AND PHARMACOLOGIC-STRESS)  GATED LEFT VENTRICULAR WALL MOTION STUDY  LEFT VENTRICULAR EJECTION FRACTION  TECHNIQUE: Standard myocardial SPECT imaging was performed after resting intravenous injection of 10 mCi [id number] sestamibi. Subsequently, intravenous infusion of Lexiscan was performed under the supervision of the Cardiology staff. At peak effect of the drug, 30 mCi Tc-4864m sestamibi was injected intravenously and standard myocardial SPECT imaging was performed. Quantitative gated imaging was also performed to evaluate left ventricular wall motion, and estimate left ventricular ejection fraction.  COMPARISON:  None.  FINDINGS: Perfusion: No decreased activity in the left ventricle on stress imaging to suggest reversible ischemia or infarction.  Wall Motion: Normal left ventricular wall motion. No left ventricular dilation.  Left Ventricular Ejection Fraction: 65 %  End diastolic volume 119 ml  End systolic volume 42 ml  IMPRESSION: 1. No reversible ischemia or infarction.  2. Normal left ventricular wall motion.  3.  Left ventricular ejection fraction 65%  4. Low-risk stress test findings*.  *2012 Appropriate Use Criteria for Coronary Revascularization Focused Update: J Am Coll Cardiol. 2012;59(9):857-881. http://content.dementiazones.comonlinejacc.org/article.aspx?articleid=1201161   Electronically Signed   By: Charline BillsSriyesh  Krishnan M.D.   On: 07/18/2014 10:46   Dg Chest Portable 1 View  07/17/2014   CLINICAL DATA:  Chest pain  EXAM: PORTABLE CHEST - 1 VIEW  COMPARISON:  12/30/2010  FINDINGS: Borderline cardiomegaly, with size accentuated by portable technique. Unchanged mild aortic tortuosity. There is no edema, consolidation, effusion, or pneumothorax. No osseous findings to explain chest pain.  IMPRESSION: No active disease.   Electronically Signed   By: Tiburcio PeaJonathan  Watts M.D.   On: 07/17/2014 04:11    Microbiology: Recent Results (from the past 240 hour(s))  MRSA PCR Screening     Status: None   Collection Time: 07/17/14  7:03 AM  Result Value Ref Range Status   MRSA by PCR NEGATIVE NEGATIVE Final    Comment:        The GeneXpert MRSA Assay (FDA approved for NASAL specimens only), is one component of a comprehensive MRSA colonization surveillance program. It is not intended to diagnose MRSA infection nor to guide or monitor treatment for MRSA infections.      Labs: Basic Metabolic Panel:  Recent Labs Lab 07/17/14 0335 07/18/14 0335  NA 143 145  K 3.5* 3.4*  CL 104 108  CO2 25 24  GLUCOSE 126* 114*  BUN 26* 20  CREATININE 1.70* 1.55*  CALCIUM 9.5 8.9  MG  --  2.2   Liver Function Tests:  Recent Labs Lab 07/17/14 0335  AST 19  ALT 17  ALKPHOS 86  BILITOT <0.2*  PROT 7.6  ALBUMIN 3.6   No results for input(s): LIPASE, AMYLASE in the last 168 hours. No results for input(s): AMMONIA in the last 168 hours. CBC:  Recent Labs Lab 07/17/14 0335 07/18/14 0335  WBC 6.1 4.4  NEUTROABS 3.2  --   HGB 12.7* 11.1*  HCT 38.4* 34.1*  MCV 86.9 85.7  PLT 238 228   Cardiac Enzymes:  Recent  Labs Lab 07/17/14 0335 07/17/14 0830 07/17/14 1553 07/17/14 2005  TROPONINI <0.30 <0.30 <0.30 <0.30   BNP: BNP (last 3 results) No results for input(s): PROBNP in the last 8760 hours. CBG: No results for input(s): GLUCAP in the last 168 hours.     SignedEddie North:  Tiny Chaudhary  [facility] 07/18/2014, 11:15 AM

## 2014-07-18 NOTE — Plan of Care (Signed)
Problem: Phase I Progression Outcomes Goal: Initial discharge plan identified Outcome: Completed/Met Date Met:  07/18/14     

## 2014-07-18 NOTE — Progress Notes (Signed)
ANTICOAGULATION CONSULT NOTE - Follow-Up Consult  Pharmacy Consult for Heparin Indication: Afib  Allergies  Allergen Reactions  . Ace Inhibitors Swelling    Throat swelling; pt was on lisinopril  . Angiotensin Receptor Blockers Swelling    Cross reactivity in known ACE I allergy  . Lisinopril Swelling    Throat swelling  . Losartan Other (See Comments)    Doctor told pt not to take    Patient Measurements: Height: 5' 10.5" (179.1 cm) Weight: 237 lb 3.2 oz (107.593 kg) IBW/kg (Calculated) : 74.15 Heparin Dosing Weight: 97 kg  Vital Signs: Temp: 98.1 F (36.7 C) (11/28 0442) Temp Source: Oral (11/28 0442) BP: 119/73 mmHg (11/28 0442) Pulse Rate: 66 (11/28 0442)  Labs:  Recent Labs  07/17/14 0335 07/17/14 0830 07/17/14 1553 07/17/14 2005 07/18/14 0335  HGB 12.7*  --   --   --  11.1*  HCT 38.4*  --   --   --  34.1*  PLT 238  --   --   --  228  LABPROT  --  13.7  --   --   --   INR  --  1.04  --   --   --   HEPARINUNFRC  --   --  0.54  --  0.30  CREATININE 1.70*  --   --   --  1.55*  TROPONINI <0.30 <0.30 <0.30 <0.30  --     Estimated Creatinine Clearance: 58.1 mL/min (by C-G formula based on Cr of 1.55).   Medical History: Past Medical History  Diagnosis Date  . Hypertension   . High cholesterol     Assessment: 68yo male with chest pain. Pt reports 3-4 days of SOB and DOE. Serial trops all neg. Found to be in Afib, to start heparin. Not on anticoag prior to admission. ECHO on 11/27 showed EF 55-60%, mild LVH, and interatrial PFO. Plans for [employer] myoview today.  11/27: H&H 12.7/38.4, PLT 238, no bleeding noted 11/28: Slight decrease in H&H, PLT stable, no bleeding noted  Heparin level has been therapeutic x2 (0.3 this AM)  Goal of Therapy:  Heparin level 0.3-0.7 units/ml Monitor platelets by anticoagulation protocol: Yes   Plan:  Continue heparin drip at 1350 units/hr Check daily CBC and heparin level Monitor s/sx of bleeding Follow up plans  for switch to Eliquis for long term anticoag  Waynette Butteryegan K. Jacey Eckerson, PharmD Clinical Pharmacy Resident Pager: [phone] 07/18/2014 8:51 AM

## 2014-07-18 NOTE — Plan of Care (Signed)
Problem: Discharge Progression Outcomes Goal: Barriers To Progression Addressed/Resolved Outcome: Completed/Met Date Met:  07/18/14 Goal: Discharge plan in place and appropriate Outcome: Completed/Met Date Met:  07/18/14 Goal: Sinus rate/atrial ECG rhythm with HR < 100/min Outcome: Completed/Met Date Met:  07/18/14 Goal: Pain controlled with appropriate interventions Outcome: Completed/Met Date Met:  07/18/14 Goal: Hemodynamically stable Outcome: Completed/Met Date Met:  92/65/99 Goal: Complications resolved/controlled Outcome: Completed/Met Date Met:  07/18/14 Goal: Tolerating diet Outcome: Completed/Met Date Met:  07/18/14 Goal: Activity appropriate for discharge plan Outcome: Completed/Met Date Met:  07/18/14 Goal: INR monitor plan established Outcome: Not Applicable Date Met:  78/77/65

## 2014-07-18 NOTE — Discharge Instructions (Addendum)
Information on my medicine - XARELTO (Rivaroxaban)  This medication education was reviewed with me or my healthcare representative as part of my discharge preparation.  The pharmacist that spoke with me during my hospital stay was:  Tailyn Hantz, David Stall, [facility]  Why was Xarelto prescribed for you? Xarelto was prescribed for you to reduce the risk of a blood clot forming that can cause a stroke if you have a medical condition called atrial fibrillation (a type of irregular heartbeat).  What do you need to know about xarelto ? Take your Xarelto ONCE DAILY at the same time every day with your evening meal. If you have difficulty swallowing the tablet whole, you may crush it and mix in applesauce just prior to taking your dose.  Take Xarelto exactly as prescribed by your doctor and DO NOT stop taking Xarelto without talking to the doctor who prescribed the medication.  Stopping without other stroke prevention medication to take the place of Xarelto may increase your risk of developing a clot that causes a stroke.  Refill your prescription before you run out.  After discharge, you should have regular check-up appointments with your healthcare provider that is prescribing your Xarelto.  In the future your dose may need to be changed if your kidney function or weight changes by a significant amount.  What do you do if you miss a dose? If you are taking Xarelto ONCE DAILY and you miss a dose, take it as soon as you remember on the same day then continue your regularly scheduled once daily regimen the next day. Do not take two doses of Xarelto at the same time or on the same day.   Important Safety Information A possible side effect of Xarelto is bleeding. You should call your healthcare provider right away if you experience any of the following: ? Bleeding from an injury or your nose that does not stop. ? Unusual colored urine (red or dark brown) or unusual colored stools (red or black). ? Unusual  bruising for unknown reasons. ? A serious fall or if you hit your head (even if there is no bleeding).  Some medicines may interact with Xarelto and might increase your risk of bleeding while on Xarelto. To help avoid this, consult your healthcare provider or pharmacist prior to using any new prescription or non-prescription medications, including herbals, vitamins, non-steroidal anti-inflammatory drugs (NSAIDs) and supplements.  This website has more information on Xarelto: VisitDestination.com.br.    Atrial Fibrillation Atrial fibrillation is a condition that causes your heart to beat irregularly. It may also cause your heart to beat faster than normal. Atrial fibrillation can prevent your heart from pumping blood normally. It increases your risk of stroke and heart problems. HOME CARE  Take medications as told by your doctor.  Only take medications that your doctor says are safe. Some medications can make the condition worse or happen again.  If blood thinners were prescribed by your doctor, take them exactly as told. Too much can cause bleeding. Too little and you will not have the needed protection against stroke and other problems.  Perform blood tests at home if told by your doctor.  Perform blood tests exactly as told by your doctor.  Do not drink alcohol.  Do not drink beverages with caffeine such as coffee, soda, and some teas.  Maintain a healthy weight.  Do not use diet pills unless your doctor says they are safe. They may make heart problems worse.  Follow diet instructions as told by  your doctor.  Exercise regularly as told by your doctor.  Keep all follow-up appointments. GET HELP IF:  You notice a change in the speed, rhythm, or strength of your heartbeat.  You suddenly begin peeing (urinating) more often.  You get tired more easily when moving or exercising. GET HELP RIGHT AWAY IF:   You have chest or belly (abdominal) pain.  You feel sick to your stomach  (nauseous).  You are short of breath.  You suddenly have swollen feet and ankles.  You feel dizzy.  You face, arms, or legs feel numb or weak.  There is a change in your vision or speech. MAKE SURE YOU:   Understand these instructions.  Will watch your condition.  Will get help right away if you are not doing well or get worse. Document Released: 05/16/2008 Document Revised: 12/22/2013 Document Reviewed: 09/17/2012 [facility] Patient Information 2015 [place], [place]. This information is not intended to replace advice given to you by your health care provider. Make sure you discuss any questions you have with your health care provider.     Apixaban oral tablets What is this medicine? APIXABAN (a PIX a ban) is an anticoagulant (blood thinner). It is used to lower the chance of stroke in people with a medical condition called atrial fibrillation. It is also used to treat or prevent blood clots in the lungs or in the veins. This medicine may be used for other purposes; ask your health care provider or pharmacist if you have questions. COMMON BRAND NAME(S): Eliquis What should I tell my health care provider before I take this medicine? They need to know if you have any of these conditions: -bleeding disorders -bleeding in the brain -blood in your stools (black or tarry stools) or if you have blood in your vomit -history of stomach bleeding -kidney disease -liver disease -mechanical heart valve -an unusual or allergic reaction to apixaban, other medicines, foods, dyes, or preservatives -pregnant or trying to get pregnant -breast-feeding How should I use this medicine? Take this medicine by mouth with a glass of water. Follow the directions on the prescription label. You can take it with or without food. If it upsets your stomach, take it with food. Take your medicine at regular intervals. Do not take it more often than directed. Do not stop taking except on your doctor's advice.  Stopping this medicine may increase your risk of a blot clot. Be sure to refill your prescription before you run out of medicine. Talk to your pediatrician regarding the use of this medicine in children. Special care may be needed. Overdosage: If you think you have taken too much of this medicine contact a poison control center or emergency room at once. NOTE: This medicine is only for you. Do not share this medicine with others. What if I miss a dose? If you miss a dose, take it as soon as you can. If it is almost time for your next dose, take only that dose. Do not take double or extra doses. What may interact with this medicine? This medicine may interact with the following: -aspirin and aspirin-like medicines -certain medicines for fungal infections like ketoconazole and itraconazole -certain medicines for seizures like carbamazepine and phenytoin -certain medicines that treat or prevent blood clots like warfarin, enoxaparin, and dalteparin -clarithromycin -NSAIDs, medicines for pain and inflammation, like ibuprofen or naproxen -rifampin -ritonavir -St. John's wort This list may not describe all possible interactions. Give your health care provider a list of all the medicines, herbs,  non-prescription drugs, or dietary supplements you use. Also tell them if you smoke, drink alcohol, or use illegal drugs. Some items may interact with your medicine. What should I watch for while using this medicine? Notify your doctor or health care professional and seek emergency treatment if you develop breathing problems; changes in vision; chest pain; severe, sudden headache; pain, swelling, warmth in the leg; trouble speaking; sudden numbness or weakness of the face, arm, or leg. These can be signs that your condition has gotten worse. If you are going to have surgery, tell your doctor or health care professional that you are taking this medicine. Tell your health care professional that you use this  medicine before you have a spinal or epidural procedure. Sometimes people who take this medicine have bleeding problems around the spine when they have a spinal or epidural procedure. This bleeding is very rare. If you have a spinal or epidural procedure while on this medicine, call your health care professional immediately if you have back pain, numbness or tingling (especially in your legs and feet), muscle weakness, paralysis, or loss of bladder or bowel control. Avoid sports and activities that might cause injury while you are using this medicine. Severe falls or injuries can cause unseen bleeding. Be careful when using sharp tools or knives. Consider using an Neurosurgeon. Take special care brushing or flossing your teeth. Report any injuries, bruising, or red spots on the skin to your doctor or health care professional. What side effects may I notice from receiving this medicine? Side effects that you should report to your doctor or health care professional as soon as possible: -allergic reactions like skin rash, itching or hives, swelling of the face, lips, or tongue -signs and symptoms of bleeding such as bloody or black, tarry stools; red or dark-brown urine; spitting up blood or brown material that looks like coffee grounds; red spots on the skin; unusual bruising or bleeding from the eye, gums, or nose This list may not describe all possible side effects. Call your doctor for medical advice about side effects. You may report side effects to FDA at 1-800-FDA-1088. Where should I keep my medicine? Keep out of the reach of children. Store at room temperature between 20 and 25 degrees C (68 and 77 degrees F). Throw away any unused medicine after the expiration date. NOTE: This sheet is a summary. It may not cover all possible information. If you have questions about this medicine, talk to your doctor, pharmacist, or health care provider.  2015, Elsevier/Gold Standard. (2013-04-11  11:59:24)  Atrial Fibrillation Atrial fibrillation is a condition that causes your heart to beat irregularly. It may also cause your heart to beat faster than normal. Atrial fibrillation can prevent your heart from pumping blood normally. It increases your risk of stroke and heart problems. HOME CARE  Take medications as told by your doctor.  Only take medications that your doctor says are safe. Some medications can make the condition worse or happen again.  If blood thinners were prescribed by your doctor, take them exactly as told. Too much can cause bleeding. Too little and you will not have the needed protection against stroke and other problems.  Perform blood tests at home if told by your doctor.  Perform blood tests exactly as told by your doctor.  Do not drink alcohol.  Do not drink beverages with caffeine such as coffee, soda, and some teas.  Maintain a healthy weight.  Do not use diet pills unless your doctor  says they are safe. They may make heart problems worse.  Follow diet instructions as told by your doctor.  Exercise regularly as told by your doctor.  Keep all follow-up appointments. GET HELP IF:  You notice a change in the speed, rhythm, or strength of your heartbeat.  You suddenly begin peeing (urinating) more often.  You get tired more easily when moving or exercising. GET HELP RIGHT AWAY IF:   You have chest or belly (abdominal) pain.  You feel sick to your stomach (nauseous).  You are short of breath.  You suddenly have swollen feet and ankles.  You feel dizzy.  You face, arms, or legs feel numb or weak.  There is a change in your vision or speech. MAKE SURE YOU:   Understand these instructions.  Will watch your condition.  Will get help right away if you are not doing well or get worse. Document Released: 05/16/2008 Document Revised: 12/22/2013 Document Reviewed: 09/17/2012 [facility] Patient Information 2015 [place]ExitCare, [place]LLC. This  information is not intended to replace advice given to you by your health care provider. Make sure you discuss any questions you have with your health care provider.

## 2014-07-18 NOTE — Progress Notes (Signed)
Patient Name: Richard Sampson Date of Encounter: 07/18/2014   Principal Problem:   A-fib Active Problems:   Chest pain   Essential hypertension, benign   Angio-edema   Acute kidney injury   Hypokalemia   HLD (hyperlipidemia)    SUBJECTIVE  Maintaining sinus.  No c/p or sob.  For MV this AM.  CURRENT MEDS . atorvastatin  10 mg Oral q1800  . diltiazem  60 mg Oral 4 times per day  . potassium chloride SA  20 mEq Oral BID  . sodium chloride  3 mL Intravenous Q12H  . Vitamin D (Ergocalciferol)  50,000 Units Oral Q Thu    OBJECTIVE  Filed Vitals:   07/18/14 0442 07/18/14 0856 07/18/14 0928 07/18/14 0929  BP: 119/73 120/72 118/86 120/82  Pulse: 66     Temp: 98.1 F (36.7 C)     TempSrc: Oral     Resp:      Height:      Weight: 237 lb 3.2 oz (107.593 kg)     SpO2: 95%       Intake/Output Summary (Last 24 hours) at 07/18/14 0929 Last data filed at 07/18/14 0600  Gross per 24 hour  Intake   1530 ml  Output   3350 ml  Net  -1820 ml   Filed Weights   07/17/14 0326 07/17/14 0633 07/18/14 0442  Weight: 235 lb (106.595 kg) 235 lb 11.2 oz (106.913 kg) 237 lb 3.2 oz (107.593 kg)    PHYSICAL EXAM  General: Pleasant, NAD. Neuro: Alert and oriented X 3. Moves all extremities spontaneously. Psych: Normal affect. HEENT:  Normal  Neck: Supple without bruits or JVD. Lungs:  Resp regular and unlabored, CTA. Heart: RRR no s3, s4, or murmurs. Abdomen: Soft, non-tender, non-distended, BS + x 4.  Extremities: No clubbing, cyanosis or edema. DP/PT/Radials 2+ and equal bilaterally.  Accessory Clinical Findings  CBC  Recent Labs  07/17/14 0335 07/18/14 0335  WBC 6.1 4.4  NEUTROABS 3.2  --   HGB 12.7* 11.1*  HCT 38.4* 34.1*  MCV 86.9 85.7  PLT 238 228   Basic Metabolic Panel  Recent Labs  07/17/14 0335 07/18/14 0335  NA 143 145  K 3.5* 3.4*  CL 104 108  CO2 25 24  GLUCOSE 126* 114*  BUN 26* 20  CREATININE 1.70* 1.55*  CALCIUM 9.5 8.9  MG  --  2.2    Liver Function Tests  Recent Labs  07/17/14 0335  AST 19  ALT 17  ALKPHOS 86  BILITOT <0.2*  PROT 7.6  ALBUMIN 3.6   Cardiac Enzymes  Recent Labs  07/17/14 0830 07/17/14 1553 07/17/14 2005  TROPONINI <0.30 <0.30 <0.30   BNP Invalid input(s): POCBNP D-Dimer  Recent Labs  07/17/14 0338  DDIMER 0.29   Hemoglobin A1C  Recent Labs  07/17/14 0830  HGBA1C 5.8*   Thyroid Function Tests  Recent Labs  07/17/14 0830  TSH 1.510    TELE  Seen in nuc med.  ECG  Rsr, 74, LAD, no acute st/t changes.  Radiology/Studies  Dg Chest Portable 1 View  07/17/2014   CLINICAL DATA:  Chest pain  EXAM: PORTABLE CHEST - 1 VIEW  COMPARISON:  12/30/2010  FINDINGS: Borderline cardiomegaly, with size accentuated by portable technique. Unchanged mild aortic tortuosity. There is no edema, consolidation, effusion, or pneumothorax. No osseous findings to explain chest pain.  IMPRESSION: No active disease.   Electronically Signed   By: Tiburcio PeaJonathan  Watts M.D.   On: 07/17/2014 04:11  2D Echocardiogram 11.27.2015  Study Conclusions  - Left ventricle: The cavity size was normal. Wall thickness was   increased in a pattern of mild LVH. Systolic function was normal.   The estimated ejection fraction was in the range of 55% to 60%.   Wall motion was normal; there were no regional wall motion   abnormalities. Doppler parameters are consistent with abnormal   left ventricular relaxation (grade 1 diastolic dysfunction). The   E/e&' ratio is between 8-15, suggesting indeterminate LV filling   pressure. - Mitral valve: Mildly thickened leaflets . There was mild   regurgitation. - Left atrium: Moderately dilated at 44 ml/m2. - Atrial septum: The atrial septum is aneurysmal. There appears to   be a patent foramen ovale by color doppler. Consider saline   microbubble contrast to confirm. _____________   ASSESSMENT AND PLAN  1.  Afib w/ RVR:  In sinus this morning.  No c/p or sob.  Echo  showed nl LV yesterday.  Cont dilt and consolidate to CD.  Await MV result.  If nl, will plan to d/c heparin and initiate eliquis prior to d/c in setting of CHA2DS2VASc = 2.  2.  Precordial Chest Pain:  In setting of afib.  No recurrence.  CE neg.  For MV today.  3.  HTN:  Stable.  4.  HL:  Cont statin.  5.  AKI:  Creat improved this AM - 1.55.  6.  Hypokalemia: supp.  7.  ? PFO:  Can have limited bubble study as outpt.  Signed, Nicolasa Duckinghristopher Berge NP  Begin apixoban and dilt Needs outpt sleep study  F/u 4-6 weeks  He will take pulse on regular basis to help ascertain frequency of atrial fib

## 2014-07-18 NOTE — Progress Notes (Signed)
Error - duplicate

## 2014-07-19 NOTE — Care Management Note (Signed)
    Page 1 of 1   07/19/2014     7:26:45 AM CARE MANAGEMENT NOTE 07/19/2014  Patient:  Elijah BirkFEIMSTER,Kodah E   Account Number:  [id number]  Date Initiated:  07/18/2014  Documentation initiated by:  [facility] LLCJEFFRIES,Sarahann Horrell  Subjective/Objective Assessment:   adm: A-fib     Action/Plan:   dishcarge plannning   Anticipated DC Date:  07/18/2014   Anticipated DC Plan:  HOME/SELF CARE      DC Planning Services  CM consult      Choice offered to / List presented to:             Status of service:  Completed, signed off Medicare Important Message given?   (If response is "NO", the following Medicare IM given date fields will be blank) Date Medicare IM given:   Medicare IM given by:   Date Additional Medicare IM given:   Additional Medicare IM given by:    Discharge Disposition:  HOME/SELF CARE  Per UR Regulation:    If discussed at Long Length of Stay Meetings, dates discussed:    Comments:  07/18/14 08:00 Cm received call from RN requesting Eliquis 30 day free trial card.  CM gave card to RN as pt was in bathroom.  RN to explain the 30 days will give pt's insurance/MD office time to authroize for refills.  No other CM needs were communicated.  Freddy JakschSarah Keighan Amezcua, BSN, CM [phone].

## 2014-07-20 NOTE — Progress Notes (Signed)
UR Completed Paquita Printy Graves-Bigelow, RN,BSN [phone]  

## 2014-08-10 ENCOUNTER — Encounter: Payer: Self-pay | Admitting: *Deleted

## 2014-08-11 ENCOUNTER — Telehealth: Payer: Self-pay | Admitting: Internal Medicine

## 2014-08-11 NOTE — Telephone Encounter (Signed)
Error msg

## 2014-08-17 ENCOUNTER — Encounter: Payer: Self-pay | Admitting: Internal Medicine

## 2014-08-17 ENCOUNTER — Telehealth: Payer: Self-pay | Admitting: Internal Medicine

## 2014-08-17 ENCOUNTER — Ambulatory Visit (INDEPENDENT_AMBULATORY_CARE_PROVIDER_SITE_OTHER): Payer: Medicare Other | Admitting: Internal Medicine

## 2014-08-17 VITALS — BP 130/74 | HR 92 | Ht 70.5 in | Wt 236.4 lb

## 2014-08-17 DIAGNOSIS — I10 Essential (primary) hypertension: Secondary | ICD-10-CM | POA: Diagnosis not present

## 2014-08-17 DIAGNOSIS — E785 Hyperlipidemia, unspecified: Secondary | ICD-10-CM | POA: Diagnosis not present

## 2014-08-17 DIAGNOSIS — Z720 Tobacco use: Secondary | ICD-10-CM | POA: Diagnosis not present

## 2014-08-17 DIAGNOSIS — I4891 Unspecified atrial fibrillation: Secondary | ICD-10-CM | POA: Diagnosis not present

## 2014-08-17 DIAGNOSIS — I480 Paroxysmal atrial fibrillation: Secondary | ICD-10-CM | POA: Diagnosis not present

## 2014-08-17 MED ORDER — DILTIAZEM HCL ER COATED BEADS 180 MG PO CP24: 180.0000 mg | ORAL_CAPSULE | Freq: Two times a day (BID) | ORAL | Status: DC

## 2014-08-17 NOTE — Telephone Encounter (Signed)
New Msg      Pt has questions about medication that was prescribed today.  Please call.

## 2014-08-17 NOTE — Progress Notes (Signed)
Patient Care Team: Dorothyann Pengobyn Sanders, MD as PCP - General (Internal Medicine)   HPI  Richard Sampson is a 68 y.o. male Seen in followup for PAF in the context of Normal LV function. Rx with dilt and apixoban   He has had no recurrent symptomatic atrial fibrillation. Checking his pulse he does have a little bit of irregularity. Functional status remains good.    Echocardiogram 11.27.2015  Study Conclusions  - Left ventricle:  mild LVH. Systolic function was normal.  E/e&' ratio is between 8-15, Left atrium: Moderately dilated at 44 ml/m2.  11/15>>Myoview 1. No reversible ischemia or infarction. 2. Normal left ventricular wall motion. 3. Left ventricular ejection fraction 65% 4. Low-risk stress test findings   Noted also to have renal insufficiency.   Past Medical History  Diagnosis Date  . Hypertension   . High cholesterol   . A-fib 07/17/2014    Past Surgical History  Procedure Laterality Date  . Tonsillectomy      Current Outpatient Prescriptions  Medication Sig Dispense Refill  . acetaminophen (TYLENOL) 325 MG tablet Take 650 mg by mouth every 6 (six) hours as needed (pain).     Marland Kitchen. amLODipine (NORVASC) 10 MG tablet Take 10 mg by mouth every evening.     Marland Kitchen. ammonium lactate (LAC-HYDRIN) 12 % lotion Apply 1 application topically 2 (two) times daily.    Marland Kitchen. apixaban (ELIQUIS) 5 MG TABS tablet Take 1 tablet (5 mg total) by mouth 2 (two) times daily. 60 tablet 0  . atorvastatin (LIPITOR) 20 MG tablet Take 10 mg by mouth every evening.     . camphor-menthol (SARNA) lotion Apply 1 application topically as needed for itching.    . diltiazem (CARDIZEM CD) 240 MG 24 hr capsule Take 1 capsule (240 mg total) by mouth daily. 30 capsule 2  . EPINEPHrine (EPIPEN) 0.3 mg/0.3 mL SOAJ Inject 0.3 mLs (0.3 mg total) into the muscle once. (Patient taking differently: Inject 0.3 mg into the muscle as needed (allergic reaction). ) 1 Device 1  . gabapentin (NEURONTIN) 300 MG capsule  Take 300 mg by mouth 3 (three) times daily.    . potassium chloride SA (K-DUR,KLOR-CON) 20 MEQ tablet Take 20 mEq by mouth 2 (two) times daily. Morning and evening    . triamterene-hydrochlorothiazide (MAXZIDE) 75-50 MG per tablet Take 1 tablet by mouth every evening.     . Vitamin D, Ergocalciferol, (DRISDOL) 50000 UNITS CAPS Take 50,000 Units by mouth every 7 (seven) days. Mondays     No current facility-administered medications for this visit.    Allergies  Allergen Reactions  . Ace Inhibitors Swelling    Throat swelling; pt was on lisinopril  . Angiotensin Receptor Blockers Swelling    Cross reactivity in known ACE I allergy  . Lisinopril Swelling    Throat swelling  . Losartan Other (See Comments)    Doctor told pt not to take    Review of Systems negative except from HPI and PMH  Physical Exam BP 130/74 mmHg  Pulse 92  Ht 5' 10.5" (1.791 m)  Wt 236 lb 6.4 oz (107.23 kg)  BMI 33.43 kg/m2 Well developed and well nourished in no acute distress HENT normal E scleral and icterus clear Neck Supple JVP flat; carotids brisk and full Clear to ausculation  Regular rate and rhythm, no murmurs gallops or rub Soft with active bowel sounds No clubbing cyanosis  Edema Alert and oriented, grossly normal motor and sensory function Skin Warm and  Dry  ECG demonstrates sinus at 92 Intervals 16/11/38 Voltage criteria for LVH  Assessment and  Plan  Atrial fibrillation  Hypertension  Angioedema to ACE inhibitor  The patient is having no symptomatic atrial fibrillation at this point. Blood pressure is borderline elevated; he is on double calcium channel blockers. We will have him hold his amlodipine. We will increase his diltiazem. Blood work was recently checked by his PCP and apparently was normal on his diuretics.

## 2014-08-17 NOTE — Patient Instructions (Signed)
Your physician wants you to follow-up in: 6 months with Rudi Cocoonna Carroll, NP in the afib clinic at the hospital and 12 months with Dr. Logan BoresKlein You will receive a reminder letter in the mail two months in advance. If you don't receive a letter, please call our office to schedule the follow-up appointment.  Your physician has recommended you make the following change in your medication:  1) Increase Diltiazem to 180mg  daily 2) Stop Amlodipine

## 2014-08-18 NOTE — Telephone Encounter (Signed)
Increase in Diltiazem 180mg  twice daily [facility] had filled it incorrectly and given him 240mg  daily which was his original rx from the hospital.  They corrected and he is aware to take the 180mg  twice daily

## 2014-11-02 DIAGNOSIS — K921 Melena: Secondary | ICD-10-CM | POA: Diagnosis not present

## 2014-11-02 DIAGNOSIS — E785 Hyperlipidemia, unspecified: Secondary | ICD-10-CM | POA: Diagnosis not present

## 2014-11-02 DIAGNOSIS — Z79899 Other long term (current) drug therapy: Secondary | ICD-10-CM | POA: Diagnosis not present

## 2014-11-02 DIAGNOSIS — I10 Essential (primary) hypertension: Secondary | ICD-10-CM | POA: Diagnosis not present

## 2014-11-02 DIAGNOSIS — I480 Paroxysmal atrial fibrillation: Secondary | ICD-10-CM | POA: Diagnosis not present

## 2014-11-03 ENCOUNTER — Encounter: Payer: Self-pay | Admitting: Physician Assistant

## 2014-11-03 ENCOUNTER — Ambulatory Visit (INDEPENDENT_AMBULATORY_CARE_PROVIDER_SITE_OTHER): Payer: Medicare Other | Admitting: Physician Assistant

## 2014-11-03 VITALS — BP 132/90 | HR 82 | Ht 70.5 in | Wt 233.0 lb

## 2014-11-03 DIAGNOSIS — I10 Essential (primary) hypertension: Secondary | ICD-10-CM

## 2014-11-03 DIAGNOSIS — E785 Hyperlipidemia, unspecified: Secondary | ICD-10-CM

## 2014-11-03 DIAGNOSIS — I480 Paroxysmal atrial fibrillation: Secondary | ICD-10-CM

## 2014-11-03 DIAGNOSIS — K921 Melena: Secondary | ICD-10-CM

## 2014-11-03 MED ORDER — PANTOPRAZOLE SODIUM 40 MG PO TBEC: 40.0000 mg | DELAYED_RELEASE_TABLET | Freq: Every day | ORAL | Status: DC

## 2014-11-03 NOTE — Progress Notes (Signed)
Cardiology Office Note   Date:  11/03/2014   ID:  RONTE PARKER, DOB 1945/10/05, MRN [id number]  PCP:  Gwynneth Aliment, MD  Cardiologist/Electrophysiologist:  Dr. Sherryl Manges   Chief Complaint  Patient presents with  . Atrial Fibrillation     History of Present Illness: ABEER IVERSEN is a 69 y.o. male with a hx of PAF, HTN, HL.  He was admitted in 11/15 with AF with RVR.  He converted to NSR on IV Diltiazem.  He is on Eliquis (CHADS2-VASc=2).   Last seen by Dr. Sherryl Manges 07/2014.    He saw his PCP yesterday with very dark colored stools since last week.  He was in [facility] for a conference.  He is not on Iron or taking Pepto.  He notes the form is normal.  There is a strange odor.  Stool is not black.  No hematochezia, hematemesis.  Received records from PCP.  Hgb stable.  No rectal exam done yesterday.  Stool cards pending.  GI consult pending.  He does have a hx of indigestion.  Also notes abdominal discomfort.  This probably improves with eating.  He was referred here today to see if hold Eliquis was appropriate.  He denies hx of CVA.  The patient denies chest pain, shortness of breath, syncope, orthopnea, PND or significant pedal edema.    Studies/Reports Reviewed Today:  Echo 07/18/14 - Mild LVH. EF 55-60%. Wall motion was normal.  Grade 1 diastolic dysfunction. - Mitral valve: Mildly thickened leaflets . There was mild regurgitation. - Left atrium: Moderately dilated at 44 ml/m2. - Atrial septum: The atrial septum is aneurysmal. There appears to be a patent foramen ovale by color doppler. Consider saline microbubble contrast to confirm.  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   Myoview 07/18/14 IMPRESSION: 1. No reversible ischemia or infarction. 2. Normal left ventricular wall motion. 3. Left ventricular ejection fraction  65% 4. Low-risk stress test findings.    Past Medical History  Diagnosis Date  . Hypertension   . High cholesterol   . A-fib 07/17/2014    Past Surgical History  Procedure Laterality Date  . Tonsillectomy       Current Outpatient Prescriptions  Medication Sig Dispense Refill  . acetaminophen (TYLENOL) 325 MG tablet Take 650 mg by mouth every 6 (six) hours as needed (pain).     Marland Kitchen ammonium lactate (LAC-HYDRIN) 12 % lotion Apply 1 application topically 2 (two) times daily.    Marland Kitchen apixaban (ELIQUIS) 5 MG TABS tablet Take 1 tablet (5 mg total) by mouth 2 (two) times daily. 60 tablet 0  . atorvastatin (LIPITOR) 20 MG tablet Take 10 mg by mouth every evening.     . camphor-menthol (SARNA) lotion Apply 1 application topically as needed for itching.    . diltiazem (CARDIZEM CD) 180 MG 24 hr capsule Take 1 capsule (180 mg total) by mouth 2 (two) times daily. 180 capsule 3  . gabapentin (NEURONTIN) 300 MG capsule Take 300 mg by mouth 3 (three) times daily.    . potassium chloride SA (K-DUR,KLOR-CON) 20 MEQ tablet Take 20 mEq by mouth 2 (two) times daily. Morning and evening    . triamterene-hydrochlorothiazide (MAXZIDE) 75-50 MG per tablet Take 1 tablet by mouth every evening.     . Vitamin D, Ergocalciferol, (DRISDOL) 50000 UNITS CAPS Take 50,000 Units by mouth every 7 (seven) days. Mondays  No current facility-administered medications for this visit.    Allergies:   Ace inhibitors; Angiotensin receptor blockers; Lisinopril; and Losartan    Social History:  The patient  reports that he has been smoking.  He does not have any smokeless tobacco history on file. He reports that he drinks alcohol.   Family History:  The patient's family history includes Cancer in his father; Heart failure in his father; Kidney disease in his son. There is no history of Heart attack or Stroke.    ROS:   Please see the history of present illness.   Review of Systems  Cardiovascular: Positive for  palpitations.  Musculoskeletal: Positive for back pain.  Gastrointestinal: Positive for constipation and hematochezia.  All other systems reviewed and are negative.    PHYSICAL EXAM: VS:  BP 132/90 mmHg  Pulse 82  Ht 5' 10.5" (1.791 m)  Wt 233 lb (105.688 kg)  BMI 32.95 kg/m2    Wt Readings from Last 3 Encounters:  11/03/14 233 lb (105.688 kg)  08/17/14 236 lb 6.4 oz (107.23 kg)  07/18/14 237 lb 3.2 oz (107.593 kg)     GEN: Well nourished, well developed, in no acute distress HEENT: normal Neck: no JVD, no masses Cardiac:  Normal S1/S2, RRR; no murmur ,  no rubs or gallops, no edema  Respiratory:  clear to auscultation bilaterally, no wheezing, rhonchi or rales. GI: soft, nontender, nondistended, + BS MS: no deformity or atrophy Skin: warm and dry  Neuro:  CNs II-XII intact, Strength and sensation are intact Psych: Normal affect Rectal:  No masses, small amount of stool in vault, Heme +  EKG:  EKG is ordered today.  It demonstrates:   NSR, HR 82, LVH, no change from prior tracing.    Recent Labs: 07/17/2014: ALT 17; TSH 1.510 07/18/2014: BUN 20; Creatinine 1.55*; Hemoglobin 11.1*; Magnesium 2.2; Platelets 228; Potassium 3.4*; Sodium 145    Lipid Panel No results found for: CHOL, TRIG, HDL, CHOLHDL, VLDL, LDLCALC, LDLDIRECT    ASSESSMENT AND PLAN:  Blood in stool I was not completely convinced his symptoms were classic for melena.  I did not want him to hold Eliquis unless we could definitely document bleeding.  However, his hemoccult is + for blood.  Labs drawn yesterday will be faxed to [place].  Fortunately his Hgb is reportedly stable.  Until we can have him further evaluated, it is probably best to hold his anticoagulation.    -  Hold Eliquis.    -  Start Protonix 40 mg QD.    -  Get him in to see GI ASAP.  Resume Eliquis when ok with GI.  PCP is working on referral.   Paroxysmal atrial fibrillation Remains in NSR.  Risk of CVA increased while off of Eliquis. I  reviewed this with him today.  Luckily he does not have a prior history of CVA.  As noted, I would like to get him back on Eliquis as soon as possible.  However, will need to have him evaluated by GI first.  Continue current dose of calcium channel blocker.  Essential hypertension, benign Borderline control.  Continue to monitor.    HLD (hyperlipidemia)  Continue statin.   Current medicines are reviewed at length with the patient today.  The patient has concerns regarding medicines.   The following changes have been made:    Hold Eliquis  Start Protonix   Labs/ tests ordered today include:  Orders Placed This Encounter  Procedures  . POC Hemoccult  Bld/Stl (1-Cd Office Dx)  . EKG 12-Lead    Disposition:   FU with Dr. Sherryl MangesSteven Klein 3-4 weeks.  PCP is arranging GI referral.    Signed, Brynda RimScott Judea Riches, PA-C, MHS 11/03/2014 2:01 PM    [facility] [address], [place], [place]  [address] Phone: [phone]; Fax: [phone]

## 2014-11-03 NOTE — Patient Instructions (Signed)
Your physician has recommended you make the following change in your medication:  1)HOLD Eliquis. Until instructed to resume 2) START Protonix 40mg  daily. An Rx has been sent to your pharmacy  We will request your recent lab work from Dr.Sanders She is aware you were seen by cardiology today, and she will initiate a referral to Gastroenterology  Your physician recommends that you schedule a follow-up appointment in: 3-4 weeks with Dr.Klein

## 2014-11-23 ENCOUNTER — Ambulatory Visit (INDEPENDENT_AMBULATORY_CARE_PROVIDER_SITE_OTHER): Payer: Medicare Other | Admitting: Internal Medicine

## 2014-11-23 ENCOUNTER — Encounter: Payer: Self-pay | Admitting: Internal Medicine

## 2014-11-23 VITALS — BP 122/74 | HR 86 | Ht 71.0 in | Wt 234.8 lb

## 2014-11-23 DIAGNOSIS — K921 Melena: Secondary | ICD-10-CM

## 2014-11-23 DIAGNOSIS — I4891 Unspecified atrial fibrillation: Secondary | ICD-10-CM | POA: Diagnosis not present

## 2014-11-23 DIAGNOSIS — I480 Paroxysmal atrial fibrillation: Secondary | ICD-10-CM

## 2014-11-23 MED ORDER — DILTIAZEM HCL ER COATED BEADS 180 MG PO CP24: 180.0000 mg | ORAL_CAPSULE | Freq: Two times a day (BID) | ORAL | Status: DC

## 2014-11-23 NOTE — Progress Notes (Signed)
Patient Care Team: Richard Pengobyn Sanders, MD as PCP - General (Internal Medicine)   HPI  Richard Sampson is a 69 y.o. male Seen in followup for PAF in the context of Normal LV function. Rx with dilt and apixoban   He has had no recurrent symptomatic atrial fibrillation. Checking his pulse he does have a little bit of irregularity. Functional status remains good.  He was seen 3/16 by Tereso NewcomerScott Weaver because of dark stools. GI evaluation was pending at that time. Hemoccults were ordered but never consummated. He has not yet been referred to GI. His stools have darkened up. He never had loose stools. Normal frequency is once every 3-4 days  CHADS-VASc score is 2 (age-78 hypertension-1)    Echocardiogram 11.27.2015  Study Conclusions  - Left ventricle:  mild LVH. Systolic function was normal.  E/e&' ratio is between 8-15, Left atrium: Moderately dilated at 44 ml/m2.  11/15>>Myoview 1. No reversible ischemia or infarction. 2. Normal left ventricular wall motion. 3. Left ventricular ejection fraction 65% 4. Low-risk stress test findings   Noted also to have renal insufficiency.   Past Medical History  Diagnosis Date  . Hypertension   . High cholesterol   . A-fib 07/17/2014    Past Surgical History  Procedure Laterality Date  . Tonsillectomy      Current Outpatient Prescriptions  Medication Sig Dispense Refill  . acetaminophen (TYLENOL) 325 MG tablet Take 650 mg by mouth every 6 (six) hours as needed (pain).     Marland Kitchen. ammonium lactate (LAC-HYDRIN) 12 % lotion Apply 1 application topically 2 (two) times daily.    Marland Kitchen. apixaban (ELIQUIS) 5 MG TABS tablet Take 1 tablet (5 mg total) by mouth 2 (two) times daily. 60 tablet 0  . atorvastatin (LIPITOR) 20 MG tablet Take 10 mg by mouth every evening.     . camphor-menthol (SARNA) lotion Apply 1 application topically as needed for itching.    . diltiazem (CARDIZEM CD) 180 MG 24 hr capsule Take 1 capsule (180 mg total) by mouth 2 (two)  times daily. 180 capsule 3  . gabapentin (NEURONTIN) 300 MG capsule Take 300 mg by mouth 3 (three) times daily.    . pantoprazole (PROTONIX) 40 MG tablet Take 1 tablet (40 mg total) by mouth daily. 30 tablet 2  . potassium chloride SA (K-DUR,KLOR-CON) 20 MEQ tablet Take 20 mEq by mouth 2 (two) times daily. Morning and evening    . triamterene-hydrochlorothiazide (MAXZIDE) 75-50 MG per tablet Take 1 tablet by mouth every evening.     . Vitamin D, Ergocalciferol, (DRISDOL) 50000 UNITS CAPS Take 50,000 Units by mouth every 7 (seven) days. Mondays     No current facility-administered medications for this visit.    Allergies  Allergen Reactions  . Ace Inhibitors Swelling    Throat swelling; pt was on lisinopril  . Angiotensin Receptor Blockers Swelling    Cross reactivity in known ACE I allergy  . Lisinopril Swelling    Throat swelling  . Losartan Other (See Comments)    Doctor told pt not to take    Review of Systems negative except from HPI and PMH  Physical Exam BP 122/74 mmHg  Pulse 86  Ht 5\' 11"  (1.803 m)  Wt 234 lb 12.8 oz (106.505 kg)  BMI 32.76 kg/m2 Well developed and well nourished in no acute distress HENT normal E scleral and icterus clear Neck Supple JVP flat; carotids brisk and full Clear to ausculation  Regular rate and rhythm,  no murmurs gallops or rub Soft with active bowel sounds No clubbing cyanosis  Edema Alert and oriented, grossly normal motor and sensory function Skin Warm and Dry  ECG demonstrates sinus at 92 Intervals 16/11/38 Voltage criteria for LVH  Assessment and  Plan  Atrial fibrillation  Hypertension  Melanotic stool  The patient's blood pressure is well-controlled. He was noted to be mildly anemic 3/16 with a hemoglobin of 11.  He was to see his PCP on Friday; however, he lost power and several GI referral has been made. We will contact his PCPs office and see if we can do anything to facilitate GI referral . We will continue to  hold his ELIQUIS for now. His CHADS-VASc since is to including for age. We anticipate his annual as risk would be about 2-2.5%. Short-term holding of his anticoagulation is reasonable; however, it becomes important to move his GI evaluation along expeditiously  We spoke with dr Allyne Gee office and will try to expedite  appt with Dr [facility] office

## 2014-11-23 NOTE — Patient Instructions (Signed)
Your physician recommends that you continue on your current medications as directed. Please refer to the Current Medication list given to you today.  We will contact Dr. Allyne GeeSanders office today to check on GI referral.  Your physician wants you to follow-up in: 1 year with Dr. Graciela HusbandsKlein.  You will receive a reminder letter in the mail two months in advance. If you don't receive a letter, please call our office to schedule the follow-up appointment.

## 2014-12-01 DIAGNOSIS — D509 Iron deficiency anemia, unspecified: Secondary | ICD-10-CM | POA: Diagnosis not present

## 2014-12-01 DIAGNOSIS — R1031 Right lower quadrant pain: Secondary | ICD-10-CM | POA: Diagnosis not present

## 2014-12-01 DIAGNOSIS — K921 Melena: Secondary | ICD-10-CM | POA: Diagnosis not present

## 2014-12-01 DIAGNOSIS — Z86010 Personal history of colonic polyps: Secondary | ICD-10-CM | POA: Diagnosis not present

## 2014-12-03 DIAGNOSIS — I1310 Hypertensive heart and chronic kidney disease without heart failure, with stage 1 through stage 4 chronic kidney disease, or unspecified chronic kidney disease: Secondary | ICD-10-CM | POA: Diagnosis not present

## 2014-12-03 DIAGNOSIS — R7309 Other abnormal glucose: Secondary | ICD-10-CM | POA: Diagnosis not present

## 2014-12-03 DIAGNOSIS — D649 Anemia, unspecified: Secondary | ICD-10-CM | POA: Diagnosis not present

## 2014-12-03 DIAGNOSIS — N182 Chronic kidney disease, stage 2 (mild): Secondary | ICD-10-CM | POA: Diagnosis not present

## 2014-12-03 DIAGNOSIS — F329 Major depressive disorder, single episode, unspecified: Secondary | ICD-10-CM | POA: Diagnosis not present

## 2014-12-03 DIAGNOSIS — Z1389 Encounter for screening for other disorder: Secondary | ICD-10-CM | POA: Diagnosis not present

## 2014-12-03 DIAGNOSIS — Z1212 Encounter for screening for malignant neoplasm of rectum: Secondary | ICD-10-CM | POA: Diagnosis not present

## 2014-12-03 DIAGNOSIS — I480 Paroxysmal atrial fibrillation: Secondary | ICD-10-CM | POA: Diagnosis not present

## 2014-12-03 DIAGNOSIS — Z0000 Encounter for general adult medical examination without abnormal findings: Secondary | ICD-10-CM | POA: Diagnosis not present

## 2014-12-31 DIAGNOSIS — M545 Low back pain: Secondary | ICD-10-CM | POA: Diagnosis not present

## 2014-12-31 DIAGNOSIS — Z720 Tobacco use: Secondary | ICD-10-CM | POA: Diagnosis not present

## 2014-12-31 DIAGNOSIS — M25551 Pain in right hip: Secondary | ICD-10-CM | POA: Diagnosis not present

## 2014-12-31 DIAGNOSIS — F329 Major depressive disorder, single episode, unspecified: Secondary | ICD-10-CM | POA: Diagnosis not present

## 2015-02-17 DIAGNOSIS — Z720 Tobacco use: Secondary | ICD-10-CM | POA: Diagnosis not present

## 2015-02-17 DIAGNOSIS — R51 Headache: Secondary | ICD-10-CM | POA: Diagnosis not present

## 2015-03-02 ENCOUNTER — Other Ambulatory Visit: Payer: Self-pay | Admitting: Physician Assistant

## 2015-03-03 DIAGNOSIS — I129 Hypertensive chronic kidney disease with stage 1 through stage 4 chronic kidney disease, or unspecified chronic kidney disease: Secondary | ICD-10-CM | POA: Diagnosis not present

## 2015-03-03 DIAGNOSIS — N182 Chronic kidney disease, stage 2 (mild): Secondary | ICD-10-CM | POA: Diagnosis not present

## 2015-03-03 DIAGNOSIS — F329 Major depressive disorder, single episode, unspecified: Secondary | ICD-10-CM | POA: Diagnosis not present

## 2015-03-03 DIAGNOSIS — R7309 Other abnormal glucose: Secondary | ICD-10-CM | POA: Diagnosis not present

## 2015-05-31 DIAGNOSIS — R7309 Other abnormal glucose: Secondary | ICD-10-CM | POA: Diagnosis not present

## 2015-05-31 DIAGNOSIS — I129 Hypertensive chronic kidney disease with stage 1 through stage 4 chronic kidney disease, or unspecified chronic kidney disease: Secondary | ICD-10-CM | POA: Diagnosis not present

## 2015-05-31 DIAGNOSIS — N182 Chronic kidney disease, stage 2 (mild): Secondary | ICD-10-CM | POA: Diagnosis not present

## 2015-05-31 DIAGNOSIS — Z23 Encounter for immunization: Secondary | ICD-10-CM | POA: Diagnosis not present

## 2015-05-31 DIAGNOSIS — R0982 Postnasal drip: Secondary | ICD-10-CM | POA: Diagnosis not present

## 2015-10-17 ENCOUNTER — Observation Stay (HOSPITAL_COMMUNITY)
Admission: EM | Admit: 2015-10-17 | Discharge: 2015-10-18 | Disposition: A | Discharge status: Home / Self Care | Payer: Medicare Other | Attending: Internal Medicine | Admitting: Internal Medicine

## 2015-10-17 ENCOUNTER — Observation Stay (HOSPITAL_COMMUNITY): Payer: Medicare Other

## 2015-10-17 ENCOUNTER — Emergency Department (HOSPITAL_COMMUNITY): Payer: Medicare Other

## 2015-10-17 ENCOUNTER — Encounter (HOSPITAL_COMMUNITY): Payer: Self-pay | Admitting: Emergency Medicine

## 2015-10-17 DIAGNOSIS — I10 Essential (primary) hypertension: Secondary | ICD-10-CM | POA: Diagnosis not present

## 2015-10-17 DIAGNOSIS — Z79899 Other long term (current) drug therapy: Secondary | ICD-10-CM | POA: Insufficient documentation

## 2015-10-17 DIAGNOSIS — I4891 Unspecified atrial fibrillation: Secondary | ICD-10-CM | POA: Insufficient documentation

## 2015-10-17 DIAGNOSIS — Z7982 Long term (current) use of aspirin: Secondary | ICD-10-CM | POA: Insufficient documentation

## 2015-10-17 DIAGNOSIS — T783XXA Angioneurotic edema, initial encounter: Secondary | ICD-10-CM | POA: Diagnosis present

## 2015-10-17 DIAGNOSIS — E785 Hyperlipidemia, unspecified: Secondary | ICD-10-CM | POA: Diagnosis not present

## 2015-10-17 DIAGNOSIS — F17200 Nicotine dependence, unspecified, uncomplicated: Secondary | ICD-10-CM | POA: Diagnosis not present

## 2015-10-17 DIAGNOSIS — I119 Hypertensive heart disease without heart failure: Secondary | ICD-10-CM | POA: Diagnosis present

## 2015-10-17 DIAGNOSIS — Z720 Tobacco use: Secondary | ICD-10-CM | POA: Diagnosis present

## 2015-10-17 DIAGNOSIS — R079 Chest pain, unspecified: Principal | ICD-10-CM | POA: Insufficient documentation

## 2015-10-17 DIAGNOSIS — E7800 Pure hypercholesterolemia, unspecified: Secondary | ICD-10-CM | POA: Diagnosis not present

## 2015-10-17 DIAGNOSIS — I444 Left anterior fascicular block: Secondary | ICD-10-CM | POA: Diagnosis present

## 2015-10-17 DIAGNOSIS — I480 Paroxysmal atrial fibrillation: Secondary | ICD-10-CM | POA: Diagnosis not present

## 2015-10-17 DIAGNOSIS — R072 Precordial pain: Secondary | ICD-10-CM

## 2015-10-17 DIAGNOSIS — I1310 Hypertensive heart and chronic kidney disease without heart failure, with stage 1 through stage 4 chronic kidney disease, or unspecified chronic kidney disease: Secondary | ICD-10-CM | POA: Diagnosis present

## 2015-10-17 DIAGNOSIS — E876 Hypokalemia: Secondary | ICD-10-CM | POA: Diagnosis present

## 2015-10-17 LAB — BASIC METABOLIC PANEL
Anion gap: 10 (ref 5–15)
BUN: 13 mg/dL (ref 6–20)
CO2: 28 mmol/L (ref 22–32)
Calcium: 9.3 mg/dL (ref 8.9–10.3)
Chloride: 103 mmol/L (ref 101–111)
Creatinine, Ser: 1.25 mg/dL — ABNORMAL HIGH (ref 0.61–1.24)
GFR calc Af Amer: >60 mL/min (ref >60)
GFR calc non Af Amer: 58 mL/min — ABNORMAL LOW (ref >60)
Glucose, Bld: 111 mg/dL — ABNORMAL HIGH (ref 65–99)
Potassium: 3 mmol/L — ABNORMAL LOW (ref 3.5–5.1)
Sodium: 141 mmol/L (ref 135–145)

## 2015-10-17 LAB — CBC
HCT: 42.1 % (ref 39.0–52.0)
Hemoglobin: 14.3 g/dL (ref 13.0–17.0)
MCH: 30.2 pg (ref 26.0–34.0)
MCHC: 34 g/dL (ref 30.0–36.0)
MCV: 89 fL (ref 78.0–100.0)
Platelets: 228 10*3/uL (ref 150–400)
RBC: 4.73 MIL/uL (ref 4.22–5.81)
RDW: 15 % (ref 11.5–15.5)
WBC: 4.8 10*3/uL (ref 4.0–10.5)

## 2015-10-17 LAB — I-STAT TROPONIN, ED: Troponin i, poc: 0 ng/mL (ref 0.00–0.08)

## 2015-10-17 LAB — TROPONIN I
Troponin I: <0.03 ng/mL (ref <0.031)
Troponin I: <0.03 ng/mL (ref <0.031)
Troponin I: <0.03 ng/mL (ref <0.031)

## 2015-10-17 MED ORDER — DULOXETINE HCL 60 MG PO CPEP
60.0000 mg | ORAL_CAPSULE | Freq: Every day | ORAL | Status: DC
  Administered 2015-10-17: 60 mg via ORAL
  Filled 2015-10-17: qty 1

## 2015-10-17 MED ORDER — POTASSIUM CHLORIDE CRYS ER 20 MEQ PO TBCR
40.0000 meq | EXTENDED_RELEASE_TABLET | Freq: Once | ORAL | Status: AC
  Administered 2015-10-17: 40 meq via ORAL
  Filled 2015-10-17: qty 2

## 2015-10-17 MED ORDER — BACLOFEN 10 MG PO TABS: 10.0000 mg | ORAL_TABLET | Freq: Two times a day (BID) | ORAL | Status: DC | PRN

## 2015-10-17 MED ORDER — SODIUM CHLORIDE 0.9 % IV SOLN
INTRAVENOUS | Status: DC
  Administered 2015-10-17 (×2): via INTRAVENOUS

## 2015-10-17 MED ORDER — PANTOPRAZOLE SODIUM 40 MG PO TBEC
40.0000 mg | DELAYED_RELEASE_TABLET | Freq: Every day | ORAL | Status: DC
  Administered 2015-10-17: 40 mg via ORAL
  Filled 2015-10-17: qty 1

## 2015-10-17 MED ORDER — GI COCKTAIL ~~LOC~~: 30.0000 mL | Freq: Four times a day (QID) | ORAL | Status: DC | PRN

## 2015-10-17 MED ORDER — IOHEXOL 350 MG/ML SOLN
100.0000 mL | Freq: Once | INTRAVENOUS | Status: AC | PRN
  Administered 2015-10-17: 100 mL via INTRAVENOUS

## 2015-10-17 MED ORDER — MORPHINE SULFATE (PF) 2 MG/ML IV SOLN: 2.0000 mg | INTRAVENOUS | Status: DC | PRN

## 2015-10-17 MED ORDER — ACETAMINOPHEN 325 MG PO TABS: 650.0000 mg | ORAL_TABLET | ORAL | Status: DC | PRN

## 2015-10-17 MED ORDER — NITROGLYCERIN 0.4 MG SL SUBL: 0.4000 mg | SUBLINGUAL_TABLET | SUBLINGUAL | Status: DC | PRN

## 2015-10-17 MED ORDER — REGADENOSON 0.4 MG/5ML IV SOLN
0.4000 mg | Freq: Once | INTRAVENOUS | Status: AC
  Administered 2015-10-18: 0.4 mg via INTRAVENOUS
  Filled 2015-10-17: qty 5

## 2015-10-17 MED ORDER — DILTIAZEM HCL ER COATED BEADS 180 MG PO CP24: 180.0000 mg | ORAL_CAPSULE | Freq: Two times a day (BID) | ORAL | Status: DC

## 2015-10-17 MED ORDER — ONDANSETRON HCL 4 MG/2ML IJ SOLN: 4.0000 mg | Freq: Four times a day (QID) | INTRAMUSCULAR | Status: DC | PRN

## 2015-10-17 MED ORDER — ENOXAPARIN SODIUM 40 MG/0.4ML ~~LOC~~ SOLN
40.0000 mg | SUBCUTANEOUS | Status: DC
  Filled 2015-10-17: qty 0.4

## 2015-10-17 MED ORDER — AMLODIPINE BESYLATE 10 MG PO TABS
10.0000 mg | ORAL_TABLET | Freq: Every day | ORAL | Status: DC
  Administered 2015-10-17: 10 mg via ORAL
  Filled 2015-10-17: qty 1

## 2015-10-17 MED ORDER — POTASSIUM CHLORIDE CRYS ER 20 MEQ PO TBCR: 20.0000 meq | EXTENDED_RELEASE_TABLET | Freq: Two times a day (BID) | ORAL | Status: DC

## 2015-10-17 MED ORDER — ASPIRIN EC 81 MG PO TBEC
324.0000 mg | DELAYED_RELEASE_TABLET | Freq: Once | ORAL | Status: DC
  Filled 2015-10-17: qty 4

## 2015-10-17 MED ORDER — DILTIAZEM HCL ER BEADS 180 MG PO CP24: 180.0000 mg | ORAL_CAPSULE | Freq: Two times a day (BID) | ORAL | Status: DC

## 2015-10-17 NOTE — ED Notes (Signed)
Pt c/o center chest pain onset today while sitting at [facility]. Pt did not have any other symptoms. Pt given 324 mg ASA and 1 Nitro with relief of pain.

## 2015-10-17 NOTE — H&P (Signed)
[facility] History and Physical  Richard Sampson ZOX:[id number] DOB: 22-Jul-1946 DOA: 10/17/2015   PCP: Gwynneth Aliment, MD   Chief Complaint: Chest Pain  HPI: Richard Sampson is a 70 y.o. male with remote history of PAF in 06/2014 not on meds, HTN and smoking history, presented with  acute onset of substernal chest pain, after attending church church described as 8/10. This was preceded by increased physical yesterday, by pruning and gardening.  Pain is sharp, constant and stabbing in nature and not worsened with deep inspiration, but worse with movement or exertion. This lasted about 30 minutes. When EMS arrived, the patient has subsided. He received nitroglycerin and aspirin. Denies any dizziness or falls. Denies any headaches or vision changes. Denies any jaw or arm pain. Denies any nausea or vomiting. He denies any similar to his pain episodes. He denies any prior catheterizations. Last time he was seen by a cardiologist was for PAF, has been discontinued of his Eliquis at the time due to GI bleed, but has been aymptomatic to date. Denies fever chills or night sweats.Denies fever chills or night sweats.  At emergency department, VSS, afebrile EKG Minimal ST elevation, anterior leads Borderline prolonged QT interval. QTC 479. Troponins are negative to date. Stress test on 07/18/14 showed lexiscan myoview today which showed a left ventricular EF of 65% with no reversible ischemia. Last 2-D echo on 07/17/2014 was normal, with EF 65%. CMET and CBC are essentially normal.     Review of Systems  Constitutional: Negative.   HENT: Negative.   Eyes: Negative.   Respiratory: Positive for shortness of breath.   Cardiovascular: Positive for chest pain. Negative for palpitations and leg swelling.  Gastrointestinal: Negative.   Genitourinary: Negative.   Musculoskeletal: Negative.   Skin: Negative.   Neurological: Negative.   Endo/Heme/Allergies: Negative.   Psychiatric/Behavioral:  Negative.     Past Medical History  Diagnosis Date  . Hypertension   . High cholesterol   . A-fib (HCC) 07/17/2014   Past Surgical History  Procedure Laterality Date  . Tonsillectomy     Social History:  reports that he has been smoking.  He does not have any smokeless tobacco history on file. He reports that he drinks alcohol. His drug history is not on file. Married, 2 children in good health, he is a Air traffic controller   Allergies  Allergen Reactions  . Ace Inhibitors Swelling    Throat swelling; pt was on lisinopril  . Angiotensin Receptor Blockers Swelling    Cross reactivity in known ACE I allergy  . Lisinopril Swelling    Throat swelling  . Losartan Other (See Comments)    Doctor told pt not to take    Family History  Problem Relation Age of Onset  . Heart failure Father   . Cancer Father   . Kidney disease Son   . Heart attack Neg Hx   . Stroke Neg Hx      Prior to Admission medications   Medication Sig Start Date End Date Taking? Authorizing Provider  aspirin EC 81 MG tablet Take 324 mg by mouth once.   Yes Historical Provider, MD  diltiazem (TIAZAC) 180 MG 24 hr capsule Take 180 mg by mouth 2 (two) times daily. 08/23/15  Yes Historical Provider, MD  DULoxetine (CYMBALTA) 60 MG capsule Take 60 mg by mouth daily. 09/20/15  Yes Historical Provider, MD  nitroGLYCERIN (NITROSTAT) 0.4 MG SL tablet Place 0.4 mg under the tongue every 5 (five) minutes as needed for  chest pain.   Yes Historical Provider, MD  acetaminophen (TYLENOL) 325 MG tablet Take 650 mg by mouth every 6 (six) hours as needed (pain).     Historical Provider, MD  ammonium lactate (LAC-HYDRIN) 12 % lotion Apply 1 application topically 2 (two) times daily.    Historical Provider, MD  apixaban (ELIQUIS) 5 MG TABS tablet Take 1 tablet (5 mg total) by mouth 2 (two) times daily. 07/18/14   Nishant Dhungel, MD  atorvastatin (LIPITOR) 20 MG tablet Take 10 mg by mouth every evening.     Historical Provider, MD    gabapentin (NEURONTIN) 300 MG capsule Take 300 mg by mouth 3 (three) times daily.    Historical Provider, MD  pantoprazole (PROTONIX) 40 MG tablet TAKE 1 TABLET (40 MG TOTAL) BY MOUTH DAILY. 03/02/15   Beatrice Lecher, PA-C  potassium chloride SA (K-DUR,KLOR-CON) 20 MEQ tablet Take 20 mEq by mouth 2 (two) times daily. Morning and evening    Historical Provider, MD  triamterene-hydrochlorothiazide (MAXZIDE) 75-50 MG per tablet Take 1 tablet by mouth every evening.     Historical Provider, MD  Vitamin D, Ergocalciferol, (DRISDOL) 50000 UNITS CAPS Take 50,000 Units by mouth every 7 (seven) days. Mondays    Historical Provider, MD   Physical Exam: Filed Vitals:   10/17/15 1215 10/17/15 1230 10/17/15 1245 10/17/15 1430  BP: 107/80 114/80 120/87 131/89  Pulse: 72 66 69 68  Temp:      TempSrc:      Resp: Height:      Weight:      SpO2: 96% 96% 96% 98%    Wt Readings from Last 3 Encounters:  10/17/15 104.327 kg (230 lb)  11/23/14 106.505 kg (234 lb 12.8 oz)  11/03/14 105.688 kg (233 lb)    Physical Exam  Constitutional: He is well-developed, well-nourished, and in no distress. No distress.  HENT:  Head: Normocephalic and atraumatic.  Mouth/Throat: No oropharyngeal exudate.  Eyes: EOM are normal. Pupils are equal, round, and reactive to light. No scleral icterus.  Neck: Normal range of motion. Neck supple. No JVD present.  Cardiovascular: Normal rate, regular rhythm and normal heart sounds.  Exam reveals no gallop and no friction rub.   No murmur heard. Pulmonary/Chest: Effort normal and breath sounds normal. No respiratory distress. He has no wheezes. He has no rales. He exhibits no tenderness.  Abdominal: Soft. Bowel sounds are normal. There is no tenderness.  Musculoskeletal: Normal range of motion. He exhibits no edema or tenderness.  Neurological: He is alert. He exhibits normal muscle tone.  Skin: Skin is warm and dry. No rash noted. He is not diaphoretic. No erythema.   Psychiatric: Mood, memory, affect and judgment normal.  Vitals reviewed.           Labs on Admission:  Basic Metabolic Panel:  Recent Labs Lab 10/17/15 1217  NA 141  K 3.0*  CL 103  CO2 28  GLUCOSE 111*  BUN 13  CREATININE 1.25*  CALCIUM 9.3     CBC:  Recent Labs Lab 10/17/15 1217  WBC 4.8  HGB 14.3  HCT 42.1  MCV 89.0  PLT 228    Radiological Exams on Admission: Dg Chest 2 View  10/17/2015  CLINICAL DATA:  70 year old male with acute chest pain. EXAM: CHEST  2 VIEW COMPARISON:  07/17/2014 FINDINGS: Mild cardiomegaly identified. There is no evidence of focal airspace disease, pulmonary edema, suspicious pulmonary nodule/mass, pleural effusion, or pneumothorax. No acute bony abnormalities  are identified. IMPRESSION: Mild cardiomegaly without evidence of acute cardiopulmonary disease. Electronically Signed   By: Harmon Pier M.D.   On: 10/17/2015 13:26      Echocardiogram 11.27.2015  Study Conclusions  - Left ventricle: mild LVH. Systolic function was normal. E/e&' ratio is between 8-15, Left atrium: Moderately dilated at 44 ml/m2.11/15>>Myoview 1. No reversible ischemia or infarction.2. Normal left ventricular wall motion. 3. Left ventricular ejection fraction 65% 4. Low-risk stress test findings   Assessment/Plan Principal Problem:   Paroxysmal atrial fibrillation (HCC) Active Problems:   Essential hypertension, benign   Hypokalemia   Chest pain   HLD (hyperlipidemia)  Chest pain syndrome/known CAD  HEART score 5 Troponin 0, EKG  Minimal ST elevation, anterior leads Borderline prolonged QT interval. QTC 479 CP was relieved by nitroglycerin, morphine, aspirin. CXR with mild cardiomegaly without evidence of acute cardiopulmonary disease. Distal pulses intact.   - Admit to Telemetry/ Observation Cycle troponin EKG in am continue  ASA, O2 and NTG as needed Continue Lipitor GI cocktail 2 D echo  CT angio Tobacco cessation program while in  hospital After discussion with Dr. Purvis Sheffield, Cardiology, patient for stress Test  Myoview on 2/27   Hypertension BP 120/87 mmHg  Pulse 69  SpO2 96% Resume Diltiazem Monitor step down telemetry   Remote Atrial Fibrillation, now on SR CADS VASC score 2 Stable. Last 2-D echo on 07/17/2014 was normal, with EF 65%.   Hypokalemia Current K 3.0 Received KCL 40 meq po x1 today Continue home Kdur while in hospital   Code Status: Full Code   DVT Prophylaxis: Lovenox Family Communication: at bedside Disposition Plan: Pending Improvement. Admitted for observation in tele bed. Expected LOS 24-48 hrs    [facility] E,PA-C [facility] [email or web address] Password [email or web address]

## 2015-10-17 NOTE — ED Provider Notes (Signed)
CSN: [id number]     Arrival date & time 10/17/15  1141 History   First MD Initiated Contact with Patient 10/17/15 1146     Chief Complaint  Patient presents with  . Chest Pain   HPI  Richard Sampson is a 70 year old male with a past medical history of A. fib, hypertension and hyperlipidemia presenting with chest pain. Patient was sitting in church approximately one hour ago when he developed gradual onset central chest pain. He states the pain gradually worsened as he was sitting in church and he left for the lobby. He states that walking and climbing a few steps exacerbated the pain. He states the pain was so severe that he has to hunch over. He described it as a sharp pain initially that evolved into a clutching pain. The pain lasted approximately 30 minutes before gradually resolving without intervention. When EMS arrived, he said the pain was already improving. He received aspirin and nitroglycerin with EMS which completely resolved his pain. He states he is currently pain-free and has no complaints. He denies associated dizziness, lightheadedness, syncope, diaphoresis, neck pain, shortness of breath or nausea with the chest pain episode. He denies history of similar chest pain episodes. He reports a distance history of treadmill stress test but states it has been many years. Denies history of cath. He had seen a cardiologist for PAF and was discontinued off of his Eliquis due to GI bleeding. He has not seen a cardiologist in approximately one year. He denies recent symptoms of A. fib. He has no other complaints today. Denies fevers, chills, headaches, URI symptoms, neck pain, myalgias, abdominal pain, nausea, vomiting or fatigue.   Chart Review: Patient admitted to hospital November 2015 with A fib. No recurrence since hospital admission. Followed by cardiologist Dr. Graciela Husbands.    Echocardiogram 11.27.2015  Study Conclusions  - Left ventricle: mild LVH. Systolic function was normal.  E/e&' ratio  is between 8-15, Left atrium: Moderately dilated at 44 ml/m2.  11/15>>Myoview 1. No reversible ischemia or infarction. 2. Normal left ventricular wall motion. 3. Left ventricular ejection fraction 65% 4. Low-risk stress test findings  Past Medical History  Diagnosis Date  . Hypertension   . High cholesterol   . A-fib (HCC) 07/17/2014   Past Surgical History  Procedure Laterality Date  . Tonsillectomy     Family History  Problem Relation Age of Onset  . Heart failure Father   . Cancer Father   . Kidney disease Son   . Heart attack Neg Hx   . Stroke Neg Hx    Social History  Substance Use Topics  . Smoking status: Current Every Day Smoker  . Smokeless tobacco: None  . Alcohol Use: Yes    Review of Systems  Cardiovascular: Positive for chest pain.  All other systems reviewed and are negative.     Allergies  Ace inhibitors; Angiotensin receptor blockers; Lisinopril; and Losartan  Home Medications   Prior to Admission medications   Medication Sig Start Date End Date Taking? Authorizing Provider  aspirin EC 81 MG tablet Take 324 mg by mouth once.   Yes Historical Provider, MD  diltiazem (TIAZAC) 180 MG 24 hr capsule Take 180 mg by mouth 2 (two) times daily. 08/23/15  Yes Historical Provider, MD  DULoxetine (CYMBALTA) 60 MG capsule Take 60 mg by mouth daily. 09/20/15  Yes Historical Provider, MD  nitroGLYCERIN (NITROSTAT) 0.4 MG SL tablet Place 0.4 mg under the tongue every 5 (five) minutes as needed for chest pain.  Yes Historical Provider, MD  acetaminophen (TYLENOL) 325 MG tablet Take 650 mg by mouth every 6 (six) hours as needed (pain).     Historical Provider, MD  ammonium lactate (LAC-HYDRIN) 12 % lotion Apply 1 application topically 2 (two) times daily.    Historical Provider, MD  apixaban (ELIQUIS) 5 MG TABS tablet Take 1 tablet (5 mg total) by mouth 2 (two) times daily. 07/18/14   Nishant Dhungel, MD  atorvastatin (LIPITOR) 20 MG tablet Take 10 mg by mouth  every evening.     Historical Provider, MD  gabapentin (NEURONTIN) 300 MG capsule Take 300 mg by mouth 3 (three) times daily.    Historical Provider, MD  pantoprazole (PROTONIX) 40 MG tablet TAKE 1 TABLET (40 MG TOTAL) BY MOUTH DAILY. 03/02/15   Beatrice Lecher, PA-C  potassium chloride SA (K-DUR,KLOR-CON) 20 MEQ tablet Take 20 mEq by mouth 2 (two) times daily. Morning and evening    Historical Provider, MD  triamterene-hydrochlorothiazide (MAXZIDE) 75-50 MG per tablet Take 1 tablet by mouth every evening.     Historical Provider, MD  Vitamin D, Ergocalciferol, (DRISDOL) 50000 UNITS CAPS Take 50,000 Units by mouth every 7 (seven) days. Mondays    Historical Provider, MD   BP 120/87 mmHg  Pulse 69  Temp(Src) 98.1 F (36.7 C) (Oral)  Resp 14  Ht 5' 10.5" (1.791 m)  Wt 104.327 kg  BMI 32.52 kg/m2  SpO2 96% Physical Exam  Constitutional: He appears well-developed and well-nourished. No distress.  HENT:  Head: Normocephalic and atraumatic.  Mouth/Throat: Oropharynx is clear and moist.  Eyes: Conjunctivae and EOM are normal. Right eye exhibits no discharge. Left eye exhibits no discharge. No scleral icterus.  Neck: Normal range of motion. Neck supple.  Cardiovascular: Normal rate, regular rhythm and normal heart sounds.   Pulmonary/Chest: Effort normal and breath sounds normal. No respiratory distress. He has no wheezes. He has no rales. He exhibits no tenderness.  Abdominal: Soft. Bowel sounds are normal. He exhibits no distension. There is no tenderness. There is no rebound and no guarding.  Musculoskeletal: Normal range of motion.  Neurological: He is alert. Coordination normal.  Skin: Skin is warm and dry.  Psychiatric: He has a normal mood and affect. His behavior is normal.  Nursing note and vitals reviewed.   ED Course  Procedures (including critical care time) Labs Review Labs Reviewed  BASIC METABOLIC PANEL - Abnormal; Notable for the following:    Potassium 3.0 (*)     Glucose, Bld 111 (*)    Creatinine, Ser 1.25 (*)    GFR calc non Af Amer 58 (*)    All other components within normal limits  CBC  I-STAT TROPOININ, ED    Imaging Review Dg Chest 2 View  10/17/2015  CLINICAL DATA:  70 year old male with acute chest pain. EXAM: CHEST  2 VIEW COMPARISON:  07/17/2014 FINDINGS: Mild cardiomegaly identified. There is no evidence of focal airspace disease, pulmonary edema, suspicious pulmonary nodule/mass, pleural effusion, or pneumothorax. No acute bony abnormalities are identified. IMPRESSION: Mild cardiomegaly without evidence of acute cardiopulmonary disease. Electronically Signed   By: Harmon Pier M.D.   On: 10/17/2015 13:26   I have personally reviewed and evaluated these images and lab results as part of my medical decision-making.   EKG Interpretation None     1:15 - Pt remains asymptomatic. Potassium low and repleted in ED. Creatinine at baseline. Pending DG chest.   MDM   Final diagnoses:  Chest pain, unspecified chest pain  type   70 year old male presenting with chest pain episode lasting approximately 30 minutes. Resolved fully with nitro and ASA. Hx of HTN, HLD and smoking. Distant hx of A fib without recurrence. Followed by Dr. Graciela Husbands cardiology. Currently asymptomatic. VSS. Nontoxic appearing. Heart RRR. Lungs CTAB. Potassium low at 3.0 which was repleted in ED. Creatinine at his baseline. Remaining bloodwork unremarkable. Troponin 0.00 with non-ischemic ECG read by Dr. Estell Harpin. Heart score 5. Pt will need inpatient monitoring for cycled troponins and cardiology consult. Discussed pt with Richard Sampson with [facility]. Pt will be admitted to telemetry.     Alveta Heimlich, PA-C 10/17/15 1400  Bethann Berkshire, MD 10/17/15 1556

## 2015-10-18 ENCOUNTER — Observation Stay (HOSPITAL_COMMUNITY): Payer: Medicare Other

## 2015-10-18 ENCOUNTER — Observation Stay (HOSPITAL_BASED_OUTPATIENT_CLINIC_OR_DEPARTMENT_OTHER): Payer: Medicare Other

## 2015-10-18 DIAGNOSIS — R079 Chest pain, unspecified: Principal | ICD-10-CM

## 2015-10-18 DIAGNOSIS — F17200 Nicotine dependence, unspecified, uncomplicated: Secondary | ICD-10-CM | POA: Diagnosis present

## 2015-10-18 DIAGNOSIS — I444 Left anterior fascicular block: Secondary | ICD-10-CM | POA: Diagnosis present

## 2015-10-18 DIAGNOSIS — I119 Hypertensive heart disease without heart failure: Secondary | ICD-10-CM

## 2015-10-18 DIAGNOSIS — Z720 Tobacco use: Secondary | ICD-10-CM | POA: Diagnosis not present

## 2015-10-18 DIAGNOSIS — E785 Hyperlipidemia, unspecified: Secondary | ICD-10-CM | POA: Diagnosis not present

## 2015-10-18 LAB — NM MYOCAR MULTI W/SPECT W/WALL MOTION / EF
Estimated workload: 1 METS
MPHR: 153 {beats}/min
Peak HR: 90 {beats}/min
Percent HR: 58 %
Rest HR: 73 {beats}/min

## 2015-10-18 MED ORDER — REGADENOSON 0.4 MG/5ML IV SOLN
INTRAVENOUS | Status: AC
  Administered 2015-10-18: 0.4 mg via INTRAVENOUS
  Filled 2015-10-18: qty 5

## 2015-10-18 MED ORDER — METOPROLOL TARTRATE 12.5 MG HALF TABLET: 12.5000 mg | ORAL_TABLET | Freq: Two times a day (BID) | ORAL | Status: DC

## 2015-10-18 MED ORDER — TECHNETIUM TC 99M SESTAMIBI GENERIC - CARDIOLITE
10.0000 | Freq: Once | INTRAVENOUS | Status: AC | PRN
  Administered 2015-10-18: 10 via INTRAVENOUS

## 2015-10-18 MED ORDER — ATORVASTATIN CALCIUM 40 MG PO TABS: 40.0000 mg | ORAL_TABLET | Freq: Every day | ORAL | Status: DC

## 2015-10-18 MED ORDER — TECHNETIUM TC 99M SESTAMIBI GENERIC - CARDIOLITE
30.0000 | Freq: Once | INTRAVENOUS | Status: AC | PRN
  Administered 2015-10-18: 30 via INTRAVENOUS

## 2015-10-18 MED ORDER — ASPIRIN 81 MG PO CHEW
81.0000 mg | CHEWABLE_TABLET | Freq: Every day | ORAL | Status: DC
Start: 2015-10-18 — End: 2015-10-18

## 2015-10-18 NOTE — Care Management Obs Status (Signed)
MEDICARE OBSERVATION STATUS NOTIFICATION   Patient Details  Name: Richard Sampson MRN: [id number] Date of Birth: 1945-10-14   Medicare Observation Status Notification Given:  Yes (chest pai stress test)    Gala Lewandowsky, RN 10/18/2015, 12:14 PM

## 2015-10-18 NOTE — Discharge Instructions (Signed)
You need to review your CT scan findings with your PCP, you need annual CT chest for a Aneurysm follow up, you also have a Liver and Kidney lesion which needs follow up.  Follow with Primary MD Gwynneth Aliment, MD in 7 days   Get CBC, CMP, 2 view Chest X ray checked  by Primary MD next visit.    Activity: As tolerated with Full fall precautions use walker/cane & assistance as needed   Disposition Home     Diet:   Heart Healthy  Check your Weight same time everyday, if you gain over 2 pounds, or you develop in leg swelling, experience more shortness of breath or chest pain, call your Primary MD immediately. Follow Cardiac Low Salt Diet and 1.5 lit/day fluid restriction.   On your next visit with your primary care physician please Get Medicines reviewed and adjusted.   Please request your Prim.MD to go over all Hospital Tests and Procedure/Radiological results at the follow up, please get all Hospital records sent to your Prim MD by signing hospital release before you go home.   If you experience worsening of your admission symptoms, develop shortness of breath, life threatening emergency, suicidal or homicidal thoughts you must seek medical attention immediately by calling 911 or calling your MD immediately  if symptoms less severe.  You Must read complete instructions/literature along with all the possible adverse reactions/side effects for all the Medicines you take and that have been prescribed to you. Take any new Medicines after you have completely understood and accpet all the possible adverse reactions/side effects.   Do not drive, operating heavy machinery, perform activities at heights, swimming or participation in water activities or provide baby sitting services if your were admitted for syncope or siezures until you have seen by Primary MD or a Neurologist and advised to do so again.  Do not drive when taking Pain medications.    Do not take more than prescribed Pain, Sleep  and Anxiety Medications  Special Instructions: If you have smoked or chewed Tobacco  in the last 2 yrs please stop smoking, stop any regular Alcohol  and or any Recreational drug use.  Wear Seat belts while driving.   Please note  You were cared for by a hospitalist during your hospital stay. If you have any questions about your discharge medications or the care you received while you were in the hospital after you are discharged, you can call the unit and asked to speak with the hospitalist on call if the hospitalist that took care of you is not available. Once you are discharged, your primary care physician will handle any further medical issues. Please note that NO REFILLS for any discharge medications will be authorized once you are discharged, as it is imperative that you return to your primary care physician (or establish a relationship with a primary care physician if you do not have one) for your aftercare needs so that they can reassess your need for medications and monitor your lab values.

## 2015-10-18 NOTE — Consult Note (Signed)
Reason for Consult:   Chest pain  Requesting Physician: [facility] Primary Cardiologist Dr Graciela Husbands  HPI:   70 AA  y/o male with a history of HTN, and tobacco (cigars) seen by cardiology in Nov 2015 when he presented with chest pain and AF with RVR. His CHADs VASc score was 2 for age and HTN. Echo showed LVH with moderate LAE, PFO, and normal LVF. Myoview was low risk. The pt converted spontaneously to NSR. He was discharged on Eliquis and Diltiazem. When seen in follow in March 2016 he apparently had described black stools. Eliquis was stopped and was supposed to be seen by Dr Loreta Ave but this apparently never happened. He currently does not take ASA or Eliquis. The pt was admitted 10/17/15 after developing chest pain while at church. He describes SSCP "tightness". He denies dyspnea, radiation to his jaw or arms. He denies any nausea or diaphoresis.  He went to the back of the church but his symptoms persisted. EMS was called. His chest pain improved with O2 and NTG SL. The denies any palpitations or tachycardia associated with chest pain. He denies any exertional symptoms prior to admission.  He does admit to occasional palpations but no sustained CTA shows ascending AO 4.1cm but no evidence of dissection. Troponin negative x 4 and EKG shows NSR without acute changes.  He is currently pain free.   PMHx:  Past Medical History  Diagnosis Date  . Hypertension   . High cholesterol   . A-fib (HCC) 07/17/2014    Past Surgical History  Procedure Laterality Date  . Tonsillectomy      SOCHx:  reports that he has been smoking.  He does not have any smokeless tobacco history on file. He reports that he drinks alcohol. His drug history is not on file.  FAMHx: Family History  Problem Relation Age of Onset  . Heart failure Father   . Cancer Father   . Kidney disease Son   . Heart attack Neg Hx   . Stroke Neg Hx     ALLERGIES: Allergies  Allergen Reactions  . Ace Inhibitors  Swelling    Throat swelling; pt was on lisinopril  . Angiotensin Receptor Blockers Swelling    Cross reactivity in known ACE I allergy  . Lisinopril Swelling    Throat swelling  . Losartan Other (See Comments)    Doctor told pt not to take    ROS: Review of Systems: General: negative for chills, fever, night sweats or weight changes.  Cardiovascular: negative for chest pain, dyspnea on exertion, edema, orthopnea, palpitations, paroxysmal nocturnal dyspnea or shortness of breath HEENT: negative for any visual disturbances, blindness, glaucoma Dermatological: negative for rash Respiratory: negative for cough, hemoptysis, or wheezing Urologic: negative for hematuria or dysuria Abdominal: negative for nausea, vomiting, diarrhea, bright red blood per rectum, melena, or hematemesis Neurologic: negative for visual changes, syncope, or dizziness Musculoskeletal: negative for back pain, joint pain, or swelling Psych: cooperative and appropriate All other systems reviewed and are otherwise negative except as noted above.   HOME MEDICATIONS: Prior to Admission medications   Medication Sig Start Date End Date Taking? Authorizing Provider  acetaminophen (TYLENOL) 325 MG tablet Take 650 mg by mouth every 6 (six) hours as needed (pain).    Yes Historical Provider, MD  amLODipine (NORVASC) 10 MG tablet Take 10 mg by mouth daily.   Yes Historical Provider, MD  ammonium lactate (LAC-HYDRIN) 12 % lotion Apply 1 application topically  2 (two) times daily.   Yes Historical Provider, MD  aspirin EC 81 MG tablet Take 324 mg by mouth once.   Yes Historical Provider, MD  baclofen (LIORESAL) 10 MG tablet Take 10 mg by mouth 2 (two) times daily as needed for muscle spasms.   Yes Historical Provider, MD  CAMPHOR-MENTHOL EX Apply 1 application topically daily as needed.   Yes Historical Provider, MD  diclofenac sodium (VOLTAREN) 1 % GEL Apply 2 g topically 3 (three) times daily as needed.   Yes Historical  Provider, MD  diltiazem (TIAZAC) 180 MG 24 hr capsule Take 180 mg by mouth 2 (two) times daily. 08/23/15  Yes Historical Provider, MD  DULoxetine (CYMBALTA) 60 MG capsule Take 60 mg by mouth daily. 09/20/15  Yes Historical Provider, MD  nitroGLYCERIN (NITROSTAT) 0.4 MG SL tablet Place 0.4 mg under the tongue every 5 (five) minutes as needed for chest pain.   Yes Historical Provider, MD  pantoprazole (PROTONIX) 40 MG tablet TAKE 1 TABLET (40 MG TOTAL) BY MOUTH DAILY. 03/02/15  Yes Scott T Alben Spittle, PA-C  potassium chloride SA (K-DUR,KLOR-CON) 20 MEQ tablet Take 20 mEq by mouth 2 (two) times daily. Morning and evening   Yes Historical Provider, MD  triamterene-hydrochlorothiazide (MAXZIDE) 75-50 MG per tablet Take 1 tablet by mouth every evening.    Yes Historical Provider, MD  apixaban (ELIQUIS) 5 MG TABS tablet Take 1 tablet (5 mg total) by mouth 2 (two) times daily. Patient not taking: Reported on 10/17/2015 07/18/14   Eddie North, MD    HOSPITAL MEDICATIONS: I have reviewed the patient's current medications.  VITALS: Blood pressure 130/86, pulse 70, temperature 98.2 F (36.8 C), temperature source Oral, resp. rate 18, height 5\' 10"  (1.778 m), weight 234 lb (106.142 kg), SpO2 94 %.  PHYSICAL EXAM: General appearance: alert, cooperative and no distress Neck: no carotid bruit and no JVD Lungs: clear to auscultation bilaterally Heart: regular rate and rhythm and S4 present Abdomen: soft, non-tender; bowel sounds normal; no masses,  no organomegaly Extremities: extremities normal, atraumatic, no cyanosis or edema Pulses: 2+ and symmetric Skin: Skin color, texture, turgor normal. No rashes or lesions Neurologic: Grossly normal  LABS: Results for orders placed or performed during the hospital encounter of 10/17/15 (from the past 24 hour(s))  Basic metabolic panel     Status: Abnormal   Collection Time: 10/17/15 12:17 PM  Result Value Ref Range   Sodium 141 135 - 145 mmol/L   Potassium 3.0  (L) 3.5 - 5.1 mmol/L   Chloride 103 101 - 111 mmol/L   CO2 28 22 - 32 mmol/L   Glucose, Bld 111 (H) 65 - 99 mg/dL   BUN 13 6 - 20 mg/dL   Creatinine, Ser 1.61 (H) 0.61 - 1.24 mg/dL   Calcium 9.3 8.9 - 09.6 mg/dL   GFR calc non Af Amer 58 (L) >60 mL/min   GFR calc Af Amer >60 >60 mL/min   Anion gap 10 5 - 15  CBC     Status: None   Collection Time: 10/17/15 12:17 PM  Result Value Ref Range   WBC 4.8 4.0 - 10.5 K/uL   RBC 4.73 4.22 - 5.81 MIL/uL   Hemoglobin 14.3 13.0 - 17.0 g/dL   HCT 04.5 40.9 - 81.1 %   MCV 89.0 78.0 - 100.0 fL   MCH 30.2 26.0 - 34.0 pg   MCHC 34.0 30.0 - 36.0 g/dL   RDW 91.4 78.2 - 95.6 %   Platelets 228 150 -  400 K/uL  I-stat troponin, ED (not at [facility], [facility])     Status: None   Collection Time: 10/17/15 12:39 PM  Result Value Ref Range   Troponin i, poc 0.00 0.00 - 0.08 ng/mL   Comment 3          Troponin I-serum (0, 3, 6 hours)     Status: None   Collection Time: 10/17/15  4:59 PM  Result Value Ref Range   Troponin I <0.03 <0.031 ng/mL  Troponin I-serum (0, 3, 6 hours)     Status: None   Collection Time: 10/17/15  7:50 PM  Result Value Ref Range   Troponin I <0.03 <0.031 ng/mL  Troponin I-serum (0, 3, 6 hours)     Status: None   Collection Time: 10/17/15 10:59 PM  Result Value Ref Range   Troponin I <0.03 <0.031 ng/mL    EKG: NSR LAFB  IMAGING: Dg Chest 2 View  10/17/2015  CLINICAL DATA:  70 year old male with acute chest pain. EXAM: CHEST  2 VIEW COMPARISON:  07/17/2014 FINDINGS: Mild cardiomegaly identified. There is no evidence of focal airspace disease, pulmonary edema, suspicious pulmonary nodule/mass, pleural effusion, or pneumothorax. No acute bony abnormalities are identified. IMPRESSION: Mild cardiomegaly without evidence of acute cardiopulmonary disease. Electronically Signed   By: Harmon Pier M.D.   On: 10/17/2015 13:26   Ct Angio Chest Aorta W/cm &/or Wo/cm  10/17/2015  CLINICAL DATA:  Chest pain.  Hypertension.  Atrial fibrillation.  EXAM: CT ANGIOGRAPHY CHEST WITH CONTRAST TECHNIQUE: Multidetector CT imaging of the chest was performed using the standard protocol during bolus administration of intravenous contrast. Multiplanar CT image reconstructions and MIPs were obtained to evaluate the vascular anatomy. CONTRAST:  OMNIPAQUE IOHEXOL 350 MG/ML SOLN COMPARISON:  10/17/2015 FINDINGS: Mediastinum/Nodes: Initial noncontrast images demonstrate no high density along the wall of the thoracic aorta to suggest intramural hematoma. There is some mild atherosclerotic calcification of the aortic arch and proximal ascending aorta. The postcontrast images demonstrate some intimal thickening along the right lateral side of the ascending thoracic aorta without significant contour irregularity. No dissection is observed. Transverse ascending thoracic aorta measures up to 4.1 cm diameter. I do not perceive a filling defect in the pulmonary arterial tree to suggest pulmonary embolus Mild to moderate cardiomegaly is present.  No pathologic adenopathy. Lungs/Pleura: Unremarkable Upper abdomen: Nonspecific hypodense 1.1 cm lesion in the right hepatic lobe, image 92 series 6. We partially image a 2.3 cm fluid density lesion of the left kidney upper pole. Mild fullness of the left adrenal gland is low-density and without an overt mass. Nondistended stomach. Musculoskeletal: Thoracic spondylosis. Review of the MIP images confirms the above findings. IMPRESSION: 1. 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 No dissection identified. Recommend annual imaging followup by CTA or MRA. This recommendation follows 2010 ACCF/AHA/AATS/ACR/ASA/SCA/SCAI/SIR/STS/SVM Guidelines for the Diagnosis and Management of Patients with Thoracic Aortic Disease. Circulation. 2010; 121: F643-P295 2. Mild to moderate cardiomegaly. 3. Nonspecific hypodense 1.1 cm indistinct lesion in the right hepatic lobe. Although statistically likely to be benign, this is technically nonspecific.  If the patient has a history of gastrointestinal malignancy or if otherwise warranted, this could be further worked up with dedicated hepatic protocol MRI with and without contrast. 4. We partially image a 2.3 cm fluid density lesion of the left kidney upper pole. Likely a cyst but not completely visualized. Electronically Signed   By: Gaylyn Rong M.D.   On: 10/17/2015 16:16    IMPRESSION: Principal Problem:  Chest pain with moderate risk of acute coronary syndrome Active Problems:   Hypertensive cardiovascular disease-LVH   Hypokalemia   PAF 2015   Angio-edema-secondary to ACE   HLD (hyperlipidemia)   LAFB (left anterior fascicular block)   Tobacco abuse   RECOMMENDATION: Myoview today. We will need to review anticoagulation with MD. Start ASA for now, check stool guaiac. Follow up SCr after CTA.  K+ replacement ordered. Stop Norvasc as pt is on Diltiazem. I have asked pharmacy to review pt's Med Rec- (not accurate). Check lipids, add statin Rx. Add low dose beta blocker. Add nitrate if he has recurrent chest pain.   Time Spent Directly with Patient: 45 minutes  Corine Shelter, [place]  [phone] beeper 10/18/2015, 10:01 AM

## 2015-10-18 NOTE — Discharge Summary (Signed)
Richard Sampson, is a 70 y.o. male  DOB 02-27-1946  MRN [id number].  Admission date:  10/17/2015  Admitting Physician  Richarda Overlie, MD  Discharge Date:  10/18/2015   Primary MD  Gwynneth Aliment, MD  Recommendations for primary care physician for things to follow:   Check CBC and BMP within a week, patient must follow with cardiology and GI closely for resumption of anticoagulation for his A. fib.  Please review CT report in detail.    Admission Diagnosis  Chest pain [R07.9] Chest pain, unspecified chest pain type [R07.9]   Discharge Diagnosis  Chest pain [R07.9] Chest pain, unspecified chest pain type [R07.9]    Principal Problem:   Chest pain with moderate risk of acute coronary syndrome Active Problems:   Hypertensive cardiovascular disease-LVH   Hypokalemia   Angio-edema-secondary to ACE   HLD (hyperlipidemia)   PAF 2015   LAFB (left anterior fascicular block)   Tobacco abuse      Past Medical History  Diagnosis Date  . Hypertension   . High cholesterol   . A-fib (HCC) 07/17/2014    Past Surgical History  Procedure Laterality Date  . Tonsillectomy         HPI  from the history and physical done on the day of admission:    [name] is a 70 y.o. male with remote history of PAF in 06/2014 not on meds, HTN and smoking history, presented with acute onset of substernal chest pain, after attending church church described as 8/10. This was preceded by increased physical yesterday, by pruning and gardening. Pain is sharp, constant and stabbing in nature and not worsened with deep inspiration, but worse with movement or exertion. This lasted about 30 minutes. When EMS arrived, the patient has subsided. He received nitroglycerin and aspirin. Denies any dizziness or falls. Denies any  headaches or vision changes. Denies any jaw or arm pain. Denies any nausea or vomiting. He denies any similar to his pain episodes. He denies any prior catheterizations. Last time he was seen by a cardiologist was for PAF, has been discontinued of his Eliquis at the time due to GI bleed, but has been aymptomatic to date. Denies fever chills or night sweats.Denies fever chills or night sweats.  At emergency department, VSS, afebrile EKG Minimal ST elevation, anterior leads Borderline prolonged QT interval. QTC 479. Troponins are negative to date. Stress test on 07/18/14 showed lexiscan myoview today which showed a left ventricular EF of 65% with no reversible ischemia. Last 2-D echo on 07/17/2014 was normal, with EF 65%. CMET and CBC are essentially normal.      Hospital Course:     1. A typical chest pain. Ruled out for MI, EKG was nonacute, seen by cardiology underwent CT angiogram which was nonacute, Myoview test with no reversible ischemia. Patient at this time will be discharged home on home medications.  2. Paroxysmal A. fib. [place] vasc 2 score off 2 or above. He was on Eliquis it was stopped by his cardiologist Dr.  Graciela Husbands for melena, this was around 3-4 months ago per patient, I have requested the patient to follow with his cardiologist and also with his GI physician Dr. Loreta Ave to clarify about his future anticoagulation status. Note he is currently not taking Eliquis or aspirin. Continue PPI for now.  3. 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 Request PCP to review CT scan in detail. He will need monitoring for these findings. Kindly review CT results below in detail.   4.Hypokalemia. Replaced. Recheck by PCP.   5. Essential hypertension. Continue home medications.   Discharge Condition: Stable  Follow UP  Follow-up Information    Follow up with SANDERS,ROBYN N, MD. Schedule an appointment as soon as possible for a visit in 1 week.   Specialty:  Internal Medicine    Why:  discuss your CT scan results   Contact information:   [address] [place] [address] [phone]       Follow up with Sherryl Manges, MD. Schedule an appointment as soon as possible for a visit in 1 week.   Specialty:  Cardiology   Contact information:   [address] [place] [address] [phone]       Follow up with Charna Elizabeth, MD. Schedule an appointment as soon as possible for a visit in 1 week.   Specialty:  Gastroenterology   Contact information:   [address] [place] [address] [phone]        Consults obtained - Cards  Diet and Activity recommendation: See Discharge Instructions below  Discharge Instructions       Discharge Instructions    Diet - low sodium heart healthy    Complete by:  As directed      Discharge instructions    Complete by:  As directed   You need to review your CT scan findings with your PCP, you need annual CT chest for a Aneurysm follow up, you also have a Liver and Kidney lesion which needs follow up.  Follow with Primary MD Gwynneth Aliment, MD in 7 days   Get CBC, CMP, 2 view Chest X ray checked  by Primary MD next visit.    Activity: As tolerated with Full fall precautions use walker/cane & assistance as needed   Disposition Home     Diet:   Heart Healthy  Check your Weight same time everyday, if you gain over 2 pounds, or you develop in leg swelling, experience more shortness of breath or chest pain, call your Primary MD immediately. Follow Cardiac Low Salt Diet and 1.5 lit/day fluid restriction.   On your next visit with your primary care physician please Get Medicines reviewed and adjusted.   Please request your Prim.MD to go over all Hospital Tests and Procedure/Radiological results at the follow up, please get all Hospital records sent to your Prim MD by signing hospital release before you go home.   If you experience worsening of your admission  symptoms, develop shortness of breath, life threatening emergency, suicidal or homicidal thoughts you must seek medical attention immediately by calling 911 or calling your MD immediately  if symptoms less severe.  You Must read complete instructions/literature along with all the possible adverse reactions/side effects for all the Medicines you take and that have been prescribed to you. Take any new Medicines after you have completely understood and accpet all the possible adverse reactions/side effects.   Do not drive, operating heavy machinery, perform activities  at heights, swimming or participation in water activities or provide baby sitting services if your were admitted for syncope or siezures until you have seen by Primary MD or a Neurologist and advised to do so again.  Do not drive when taking Pain medications.    Do not take more than prescribed Pain, Sleep and Anxiety Medications  Special Instructions: If you have smoked or chewed Tobacco  in the last 2 yrs please stop smoking, stop any regular Alcohol  and or any Recreational drug use.  Wear Seat belts while driving.   Please note  You were cared for by a hospitalist during your hospital stay. If you have any questions about your discharge medications or the care you received while you were in the hospital after you are discharged, you can call the unit and asked to speak with the hospitalist on call if the hospitalist that took care of you is not available. Once you are discharged, your primary care physician will handle any further medical issues. Please note that NO REFILLS for any discharge medications will be authorized once you are discharged, as it is imperative that you return to your primary care physician (or establish a relationship with a primary care physician if you do not have one) for your aftercare needs so that they can reassess your need for medications and monitor your lab values.     Increase activity slowly     Complete by:  As directed              Discharge Medications       Medication List    STOP taking these medications        apixaban 5 MG Tabs tablet  Commonly known as:  ELIQUIS      TAKE these medications        acetaminophen 325 MG tablet  Commonly known as:  TYLENOL  Take 650 mg by mouth every 6 (six) hours as needed (pain).     amLODipine 10 MG tablet  Commonly known as:  NORVASC  Take 10 mg by mouth daily.     ammonium lactate 12 % lotion  Commonly known as:  LAC-HYDRIN  Apply 1 application topically 2 (two) times daily.     aspirin EC 81 MG tablet  Take 324 mg by mouth once.     baclofen 10 MG tablet  Commonly known as:  LIORESAL  Take 10 mg by mouth 2 (two) times daily as needed for muscle spasms.     CAMPHOR-MENTHOL EX  Apply 1 application topically daily as needed.     diclofenac sodium 1 % Gel  Commonly known as:  VOLTAREN  Apply 2 g topically 3 (three) times daily as needed.     diltiazem 180 MG 24 hr capsule  Commonly known as:  TIAZAC  Take 180 mg by mouth 2 (two) times daily.     DULoxetine 60 MG capsule  Commonly known as:  CYMBALTA  Take 60 mg by mouth daily.     nitroGLYCERIN 0.4 MG SL tablet  Commonly known as:  NITROSTAT  Place 0.4 mg under the tongue every 5 (five) minutes as needed for chest pain.     pantoprazole 40 MG tablet  Commonly known as:  PROTONIX  TAKE 1 TABLET (40 MG TOTAL) BY MOUTH DAILY.     potassium chloride SA 20 MEQ tablet  Commonly known as:  K-DUR,KLOR-CON  Take 20 mEq by mouth 2 (two) times daily. Morning and evening  triamterene-hydrochlorothiazide 75-50 MG tablet  Commonly known as:  MAXZIDE  Take 1 tablet by mouth every evening.        Major procedures and Radiology Reports - PLEASE review detailed and final reports for all details, in brief -       Dg Chest 2 View  10/17/2015  CLINICAL DATA:  70 year old male with acute chest pain. EXAM: CHEST  2 VIEW COMPARISON:  07/17/2014 FINDINGS: Mild  cardiomegaly identified. There is no evidence of focal airspace disease, pulmonary edema, suspicious pulmonary nodule/mass, pleural effusion, or pneumothorax. No acute bony abnormalities are identified. IMPRESSION: Mild cardiomegaly without evidence of acute cardiopulmonary disease. Electronically Signed   By: Harmon Pier M.D.   On: 10/17/2015 13:26   Nm Myocar Multi W/spect W/wall Motion / Ef  10/18/2015  CLINICAL DATA:  70 year old male with hypertension atrial fibrillation. Chest pain. EXAM: MYOCARDIAL IMAGING WITH SPECT (REST AND PHARMACOLOGIC-STRESS) GATED LEFT VENTRICULAR WALL MOTION STUDY LEFT VENTRICULAR EJECTION FRACTION TECHNIQUE: Standard myocardial SPECT imaging was performed after resting intravenous injection of 10 mCi Tc-36m sestamibi. Subsequently, intravenous infusion of Lexiscan was performed under the supervision of the Cardiology staff. At peak effect of the drug, 30 mCi Tc-77m sestamibi was injected intravenously and standard myocardial SPECT imaging was performed. Quantitative gated imaging was also performed to evaluate left ventricular wall motion, and estimate left ventricular ejection fraction. COMPARISON:  Stress test 07/18/2014 FINDINGS: Noted EKG changes during Lexiscan stress. Perfusion: No decreased activity in the left ventricle on stress imaging to suggest reversible ischemia or infarction. Wall Motion: Normal left ventricular wall motion. No left ventricular dilation. Left Ventricular Ejection Fraction: 61 % End diastolic volume 129 ml End systolic volume 50 ml IMPRESSION: 1. No reversible ischemia or infarction. 2. Normal left ventricular wall motion. 3. Left ventricular ejection fraction 61% 4. Low-risk stress test findings*. *2012 Appropriate Use Criteria for Coronary Revascularization Focused Update: J Am Coll Cardiol. 2012;59(9):857-881. http://content.dementiazones.com.aspx?articleid=[email or web address] These results will be called to the ordering clinician or representative by  the Radiologist Assistant, and communication documented in the PACS or zVision Dashboard. Electronically Signed   By: Genevive Bi M.D.   On: 10/18/2015 12:51   Ct Angio Chest Aorta W/cm &/or Wo/cm  10/17/2015  CLINICAL DATA:  Chest pain.  Hypertension.  Atrial fibrillation. EXAM: CT ANGIOGRAPHY CHEST WITH CONTRAST TECHNIQUE: Multidetector CT imaging of the chest was performed using the standard protocol during bolus administration of intravenous contrast. Multiplanar CT image reconstructions and MIPs were obtained to evaluate the vascular anatomy. CONTRAST:  OMNIPAQUE IOHEXOL 350 MG/ML SOLN COMPARISON:  10/17/2015 FINDINGS: Mediastinum/Nodes: Initial noncontrast images demonstrate no high density along the wall of the thoracic aorta to suggest intramural hematoma. There is some mild atherosclerotic calcification of the aortic arch and proximal ascending aorta. The postcontrast images demonstrate some intimal thickening along the right lateral side of the ascending thoracic aorta without significant contour irregularity. No dissection is observed. Transverse ascending thoracic aorta measures up to 4.1 cm diameter. I do not perceive a filling defect in the pulmonary arterial tree to suggest pulmonary embolus Mild to moderate cardiomegaly is present.  No pathologic adenopathy. Lungs/Pleura: Unremarkable Upper abdomen: Nonspecific hypodense 1.1 cm lesion in the right hepatic lobe, image 92 series 6. We partially image a 2.3 cm fluid density lesion of the left kidney upper pole. Mild fullness of the left adrenal gland is low-density and without an overt mass. Nondistended stomach. Musculoskeletal: Thoracic spondylosis. Review of the MIP images confirms the above findings. IMPRESSION: 1. Small ascending thoracic  aorta, 4.1 cm diameter. No dissection identified. Recommend annual imaging followup by CTA or MRA. This recommendation follows 2010 ACCF/AHA/AATS/ACR/ASA/SCA/SCAI/SIR/STS/SVM Guidelines for the  Diagnosis and Management of Patients with Thoracic Aortic Disease. Circulation. 2010; 121: Z610-R604 2. Mild to moderate cardiomegaly. 3. Nonspecific hypodense 1.1 cm indistinct lesion in the right hepatic lobe. Although statistically likely to be benign, this is technically nonspecific. If the patient has a history of gastrointestinal malignancy or if otherwise warranted, this could be further worked up with dedicated hepatic protocol MRI with and without contrast. 4. We partially image a 2.3 cm fluid density lesion of the left kidney upper pole. Likely a cyst but not completely visualized. Electronically Signed   By: Gaylyn Rong M.D.   On: 10/17/2015 16:16    Micro Results      No results found for this or any previous visit (from the past 240 hour(s)).     Today   Subjective    Selinda Michaels today has no headache,no chest abdominal pain,no new weakness tingling or numbness, feels much better wants to go home today.    Objective   Blood pressure 132/82, pulse 79, temperature 98.5 F (36.9 C), temperature source Oral, resp. rate 18, height  (1.778 m), weight 106.142 kg (234 lb), SpO2 97 %.   Intake/Output Summary (Last 24 hours) at 10/18/15 1411 Last data filed at 10/17/15 1840  Gross per 24 hour  Intake  747.5 ml  Output      0 ml  Net  747.5 ml    Exam Awake Alert, Oriented x 3, No new F.N deficits, Normal affect Stoy.AT,PERRAL Supple Neck,No JVD, No cervical lymphadenopathy appriciated.  Symmetrical Chest wall movement, Good air movement bilaterally, CTAB RRR,No Gallops,Rubs or new Murmurs, No Parasternal Heave +ve B.Sounds, Abd Soft, Non tender, No organomegaly appriciated, No rebound -guarding or rigidity. No Cyanosis, Clubbing or edema, No new Rash or bruise   Data Review   CBC w Diff: Lab Results  Component Value Date   WBC 4.8 10/17/2015   HGB 14.3 10/17/2015   HCT 42.1 10/17/2015   PLT 228 10/17/2015   LYMPHOPCT 26 07/17/2014   MONOPCT 20*  07/17/2014   EOSPCT 2 07/17/2014   BASOPCT 0 07/17/2014    CMP: Lab Results  Component Value Date   NA 141 10/17/2015   K 3.0* 10/17/2015   CL 103 10/17/2015   CO2 28 10/17/2015   BUN 13 10/17/2015   CREATININE 1.25* 10/17/2015   PROT 7.6 07/17/2014   ALBUMIN 3.6 07/17/2014   BILITOT <0.2* 07/17/2014   ALKPHOS 86 07/17/2014   AST 19 07/17/2014   ALT 17 07/17/2014  .   Total Time in preparing paper work, data evaluation and todays exam - 35 minutes  Leroy Sea M.D on 10/18/2015 at 2:11 PM  [facility]   Office  [phone]

## 2015-10-18 NOTE — Progress Notes (Signed)
  Echocardiogram 2D Echocardiogram has been performed.  Richard Sampson 10/18/2015, 2:03 PM

## 2015-10-20 ENCOUNTER — Encounter: Payer: Self-pay | Admitting: Cardiology

## 2015-10-20 ENCOUNTER — Ambulatory Visit (INDEPENDENT_AMBULATORY_CARE_PROVIDER_SITE_OTHER): Payer: Medicare Other | Admitting: Cardiology

## 2015-10-20 VITALS — BP 124/84 | HR 86 | Ht 70.0 in | Wt 233.1 lb

## 2015-10-20 DIAGNOSIS — T783XXD Angioneurotic edema, subsequent encounter: Secondary | ICD-10-CM

## 2015-10-20 DIAGNOSIS — I119 Hypertensive heart disease without heart failure: Secondary | ICD-10-CM

## 2015-10-20 DIAGNOSIS — I480 Paroxysmal atrial fibrillation: Secondary | ICD-10-CM | POA: Diagnosis not present

## 2015-10-20 DIAGNOSIS — T783XXA Angioneurotic edema, initial encounter: Secondary | ICD-10-CM | POA: Insufficient documentation

## 2015-10-20 DIAGNOSIS — R079 Chest pain, unspecified: Secondary | ICD-10-CM

## 2015-10-20 DIAGNOSIS — K922 Gastrointestinal hemorrhage, unspecified: Secondary | ICD-10-CM | POA: Insufficient documentation

## 2015-10-20 DIAGNOSIS — R0789 Other chest pain: Secondary | ICD-10-CM | POA: Diagnosis not present

## 2015-10-20 DIAGNOSIS — I77810 Thoracic aortic ectasia: Secondary | ICD-10-CM

## 2015-10-20 DIAGNOSIS — K219 Gastro-esophageal reflux disease without esophagitis: Secondary | ICD-10-CM

## 2015-10-20 DIAGNOSIS — K921 Melena: Secondary | ICD-10-CM

## 2015-10-20 MED ORDER — OMEPRAZOLE 20 MG PO CPDR: 20.0000 mg | DELAYED_RELEASE_CAPSULE | Freq: Every day | ORAL | Status: DC

## 2015-10-20 MED ORDER — ASPIRIN EC 81 MG PO TBEC: 81.0000 mg | DELAYED_RELEASE_TABLET | Freq: Every day | ORAL | Status: DC

## 2015-10-20 NOTE — Progress Notes (Signed)
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 He was seen by cardiology in Nov 2015 when he presented with chest pain and AF with RVR. His CHADs VASc score was 2 for age and HTN. Echo showed LVH with moderate LAE, and normal LVF. Myoview then was low risk. The pt converted spontaneously to NSR. He was discharged on Eliquis and Diltiazem. When seen in follow in March 2016 he apparently had described black stools. Eliquis was stopped and was supposed to be seen by Dr Richard Sampson for GI evaluation but this apparently never happened. He currently does not take ASA or Eliquis. The pt was admitted 10/17/15 after developing chest pain while at church. He describes SSCP "tightness". The pt denied any palpitations or tachycardia associated with chest pain and he was in NSR on admission to ED. CTA on admission showed ascending AO 4.1cm but no evidence of dissection. Troponin were negative. Myoview was low risk. He remained in NSR throughout his hospitalization. He is in the office today for follow up. In retrospect he thinks his discomfort was secondary to indigestion as he he says he felt better after belching. He has noted intermittent indigestion.    Current Outpatient Prescriptions  Medication Sig Dispense Refill  . ammonium lactate (LAC-HYDRIN) 12 % lotion Apply 1 application topically 2 (two) times daily.    . diclofenac sodium (VOLTAREN) 1 % GEL Apply 2 g topically 3 (three) times daily as needed.    . diltiazem (TIAZAC) 180 MG 24 hr capsule Take 180 mg by mouth 2 (two) times daily.  0  . DULoxetine (CYMBALTA) 60 MG capsule Take 60 mg by mouth daily.  3  . potassium chloride SA (K-DUR,KLOR-CON) 20 MEQ tablet Take 20 mEq by mouth 2 (two) times daily. Morning and evening    . triamterene-hydrochlorothiazide (MAXZIDE) 75-50 MG per  tablet Take 0.5 tablets by mouth every evening.     Marland Kitchen aspirin EC 81 MG tablet Take 1 tablet (81 mg total) by mouth daily. 90 tablet 3  . omeprazole (PRILOSEC) 20 MG capsule Take 1 capsule (20 mg total) by mouth daily. 30 capsule 6   No current facility-administered medications for this visit.    Allergies  Allergen Reactions  . Ace Inhibitors Swelling    Throat swelling; pt was on lisinopril  . Angiotensin Receptor Blockers Swelling    Cross reactivity in known ACE I allergy  . Lisinopril Swelling    Throat swelling  . Losartan Other (See Comments)    Doctor told pt not to take    Social History   Social History  . Marital Status: Married    Spouse Name: N/A  . Number of Children: N/A  . Years of Education: N/A   Occupational History  . Not on file.   Social History Main Topics  . Smoking status: Current Every Day Smoker  . Smokeless tobacco: Not on file  . Alcohol Use: Yes  . Drug Use: Not on file  . Sexual Activity: Not on file   Other Topics Concern  . Not on file   Social History Narrative     Review of Systems: General: negative for chills, fever, night sweats or weight changes.  Cardiovascular: negative for chest pain, dyspnea on exertion, edema, orthopnea, palpitations, paroxysmal nocturnal dyspnea or shortness of breath Dermatological:  negative for rash Respiratory: negative for cough or wheezing Urologic: negative for hematuria Abdominal: negative for nausea, vomiting, diarrhea, bright red blood per rectum, melena, or hematemesis Neurologic: negative for visual changes, syncope, or dizziness All other systems reviewed and are otherwise negative except as noted above.    Blood pressure 124/84, pulse 86, height  (1.778 m), weight 233 lb 1.9 oz (105.743 kg).  General appearance: alert, cooperative, no distress and mildly obese Neck: no carotid bruit and no JVD Lungs: clear to auscultation bilaterally Heart: regular rate and rhythm Extremities:  no edema Neurologic: Grossly normal  EKG NSR, LAFB  ASSESSMENT AND PLAN:   Chest pain with moderate risk of acute coronary syndrome Myoview low risk 10/18/15- suspect this was GI  PAF 2015 CHADs VASc =2. NSR on admission 10/17/15  Hypertensive cardiovascular disease-LVH Controlled, mild LVH, normal LVF  GI bleed ? GI bleeding in 2015 - Eliquis stopped then In retrospect pt thinks this may have been hemorrhoidal bleeding, this was never worked up.  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  We have urged Richard Sampson to see GI to be cleared for NOAC therapy. In the interm I suggested he take a coated aspirin daily and Omeprazole 20 mg daily. I also stopped his Norvasc as he is on Diltiazem.   Richard Sampson K PA-C 10/20/2015 9:54 AM   Note: Regarding his ascending AO dilatation he should have an MRA in one year, if no change- no further follow up. I forgot to discuss this with the pt and this should be brought up at his OV with Dr Richard Sampson in 6 weeks.

## 2015-10-20 NOTE — Assessment & Plan Note (Signed)
Myoview low risk 10/18/15- suspect this was GI

## 2015-10-20 NOTE — Assessment & Plan Note (Signed)
By history

## 2015-10-20 NOTE — Assessment & Plan Note (Signed)
4.1 cm by CTA Feb 2017

## 2015-10-20 NOTE — Assessment & Plan Note (Signed)
CHADs VASc =2. NSR on admission 10/17/15

## 2015-10-20 NOTE — Patient Instructions (Addendum)
Medication Instructions:  Your physician has recommended you make the following change in your medication:  1) STOP Amlodipine 2) START coated Aspirin 81 mg daily 3) START Omeprazole 20 mg daily  Labwork: None ordered  Testing/Procedures: None ordered  Follow-Up: Your physician recommends that you schedule a follow-up appointment in: 6 weeks with Dr. Graciela Husbands.   Any Other Special Instructions Will Be Listed Below (If Applicable). Talk with Dr. Zella Ball about GI follow up   If you need a refill on your cardiac medications before your next appointment, please call your pharmacy.  Thank you for choosing [facility]!!

## 2015-10-20 NOTE — Assessment & Plan Note (Signed)
Controlled, mild LVH, normal LVF

## 2015-10-20 NOTE — Assessment & Plan Note (Signed)
?   GI bleeding in 2015 - Eliquis stopped then In retrospect pt thinks this may have been hemorrhoidal bleeding, this was never worked up.

## 2015-10-22 ENCOUNTER — Ambulatory Visit
Admission: RE | Admit: 2015-10-22 | Discharge: 2015-10-22 | Disposition: A | Discharge status: Home / Self Care | Payer: Medicare Other | Source: Ambulatory Visit | Attending: Internal Medicine | Admitting: Internal Medicine

## 2015-10-22 ENCOUNTER — Other Ambulatory Visit: Payer: Self-pay | Admitting: Nurse Practitioner

## 2015-10-22 DIAGNOSIS — R0789 Other chest pain: Secondary | ICD-10-CM

## 2015-10-22 DIAGNOSIS — R079 Chest pain, unspecified: Secondary | ICD-10-CM | POA: Diagnosis not present

## 2015-10-22 DIAGNOSIS — I719 Aortic aneurysm of unspecified site, without rupture: Secondary | ICD-10-CM | POA: Diagnosis not present

## 2015-10-22 DIAGNOSIS — E876 Hypokalemia: Secondary | ICD-10-CM | POA: Diagnosis not present

## 2015-11-05 IMAGING — CR DG CHEST 1V PORT
1 series · 1 of 1 positions shown · non-contrast
Comparison: 12/30/2010

CLINICAL DATA: Chest pain

EXAM:
PORTABLE CHEST - 1 VIEW

[view not recorded]
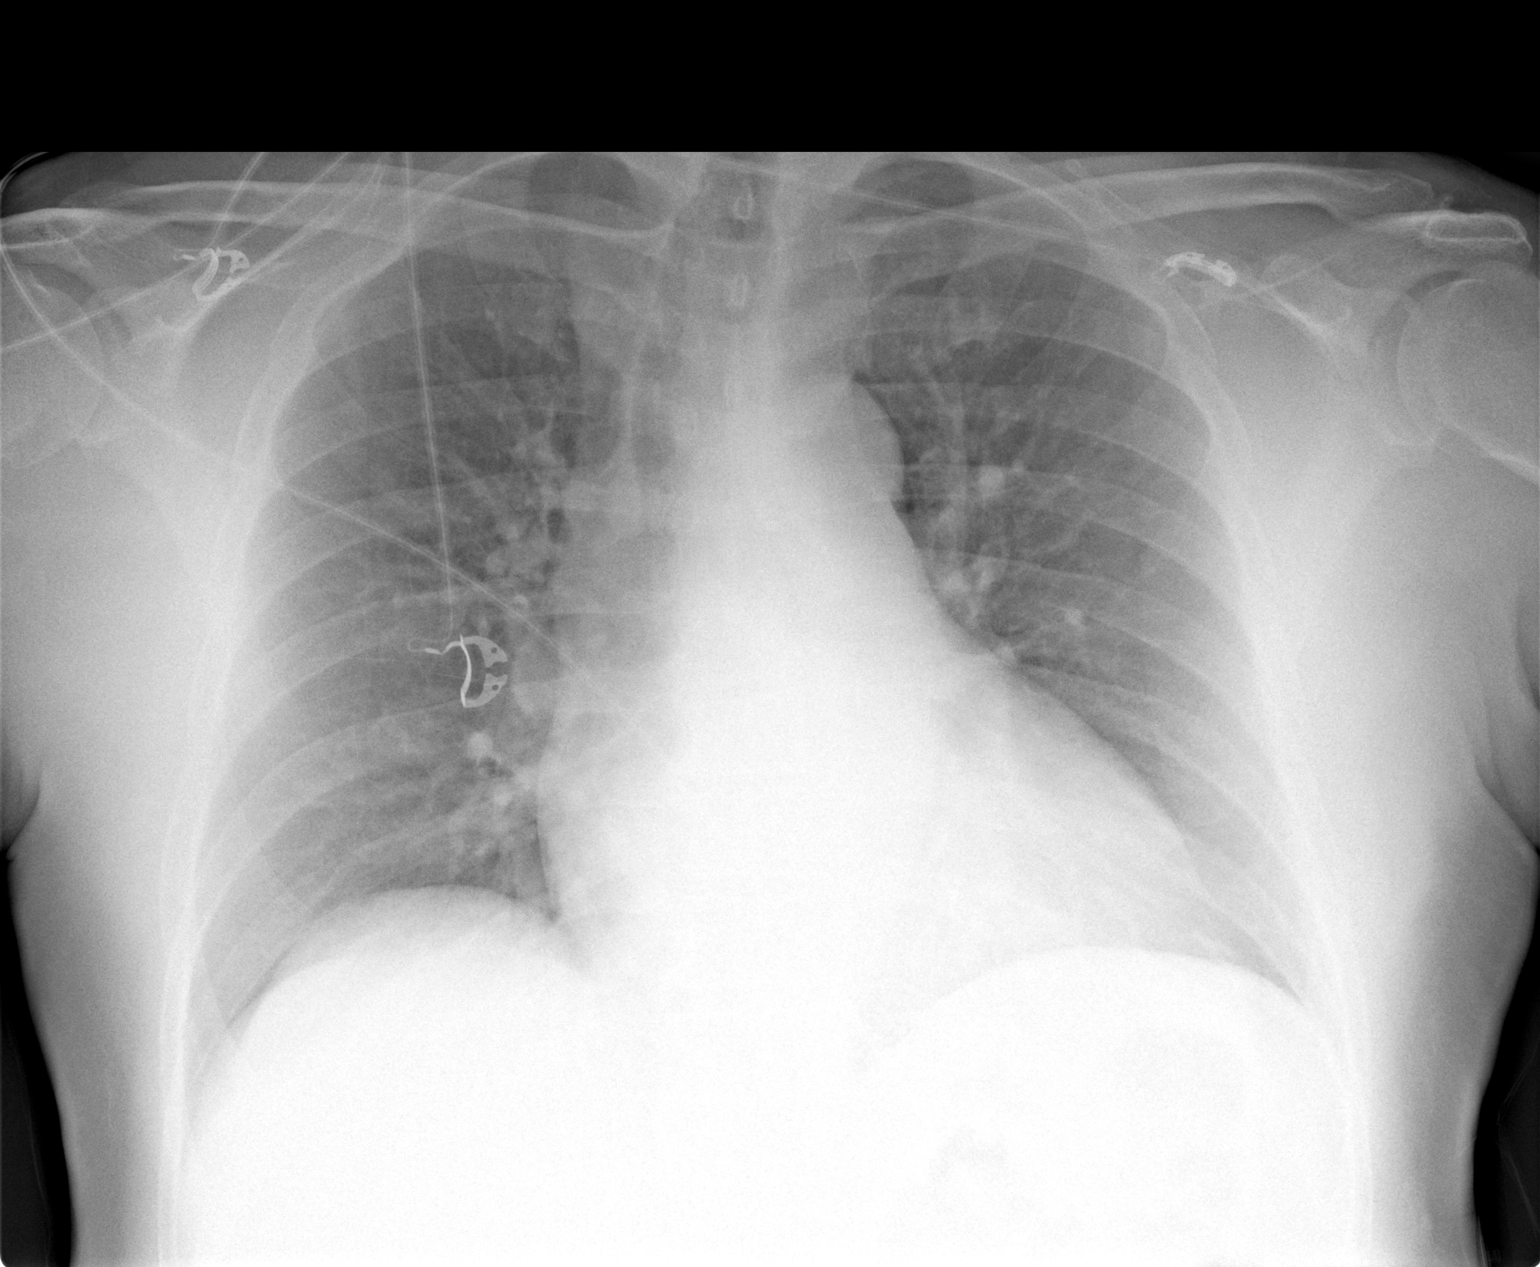

[1 of 1 positions shown; findings below may reference images not displayed]

FINDINGS: Borderline cardiomegaly, with size accentuated by portable
technique. Unchanged mild aortic tortuosity. There is no edema,
consolidation, effusion, or pneumothorax. No osseous findings to
explain chest pain.
IMPRESSION: No active disease.

## 2015-12-16 ENCOUNTER — Encounter: Payer: Self-pay | Admitting: Internal Medicine

## 2015-12-16 ENCOUNTER — Ambulatory Visit (INDEPENDENT_AMBULATORY_CARE_PROVIDER_SITE_OTHER): Payer: Medicare Other | Admitting: Internal Medicine

## 2015-12-16 VITALS — BP 110/80 | HR 89 | Ht 70.5 in | Wt 237.8 lb

## 2015-12-16 DIAGNOSIS — I77810 Thoracic aortic ectasia: Secondary | ICD-10-CM

## 2015-12-16 DIAGNOSIS — I4891 Unspecified atrial fibrillation: Secondary | ICD-10-CM | POA: Diagnosis not present

## 2015-12-16 DIAGNOSIS — I10 Essential (primary) hypertension: Secondary | ICD-10-CM

## 2015-12-16 MED ORDER — APIXABAN 5 MG PO TABS: 5.0000 mg | ORAL_TABLET | Freq: Two times a day (BID) | ORAL | Status: DC

## 2015-12-16 NOTE — Progress Notes (Signed)
Patient Care Team: [name], MD as PCP - General (Internal Medicine)   HPI  Richard Sampson is a 70 y.o. male Seen in followup for PAF in the context of Normal LV function. Rx with dilt and apixoban   He has had no recurrent symptomatic atrial fibrillation. Checking his pulse he does have a little bit of irregularity. Functional status remains good.    He has a history of dark stools. Had been referred to GI.   [facility] Does not [facility]   CHADS-VASc score is 2 (age-14 hypertension-1)    Echocardiogram 11.27.2015  Study Conclusions  - Left ventricle:  mild LVH. Systolic function was normal.  E/e&' ratio is between 8-15, Left atrium: Moderately dilated at 44 ml/m2.  11/15>>Myoview 1. No reversible ischemia or infarction. 2. Normal left ventricular wall motion. 3. Left ventricular ejection fraction 65% 4. Low-risk stress test findings  Hospitalized 2/17 for chest pain. Underwent Myoview scan that was low risk/negative. Chest pain thought to be related to GI issues with his prior history of bleeding.   No recurrent problems with chest pain or shortness of breath.     Past Medical History  Diagnosis Date  . Hypertension   . High cholesterol   . A-fib (HCC) 07/17/2014    Past Surgical History  Procedure Laterality Date  . Tonsillectomy      Current Outpatient Prescriptions  Medication Sig Dispense Refill  . ammonium lactate (LAC-HYDRIN) 12 % lotion Apply 1 application topically 2 (two) times daily.    Marland Kitchen aspirin EC 81 MG tablet Take 1 tablet (81 mg total) by mouth daily. 90 tablet 3  . diclofenac sodium (VOLTAREN) 1 % GEL Apply 2 g topically 3 (three) times daily as needed.    . diltiazem (TIAZAC) 180 MG 24 hr capsule Take 180 mg by mouth 2 (two) times daily.  0  . DULoxetine (CYMBALTA) 60 MG capsule Take 60 mg by mouth daily.  3  . omeprazole (PRILOSEC) 20 MG capsule Take 1 capsule (20 mg total) by  mouth daily. 30 capsule 6  . potassium chloride SA (K-DUR,KLOR-CON) 20 MEQ tablet Take 20 mEq by mouth 2 (two) times daily. Morning and evening    . triamterene-hydrochlorothiazide (MAXZIDE) 75-50 MG per tablet Take 0.5 tablets by mouth every evening.     Marland Kitchen apixaban (ELIQUIS) 5 MG TABS tablet Take 1 tablet (5 mg total) by mouth 2 (two) times daily. 60 tablet 0   No current facility-administered medications for this visit.    Allergies  Allergen Reactions  . Ace Inhibitors Swelling    Throat swelling; pt was on lisinopril  . Angiotensin Receptor Blockers Swelling    Cross reactivity in known ACE I allergy  . Lisinopril Swelling    Throat swelling  . Losartan Other (See Comments)    Doctor told pt not to take    Review of Systems negative except from HPI and PMH  Physical Exam BP 110/80 mmHg  Pulse 89  Ht 5' 10.5" (1.791 m)  Wt 237 lb 12.8 oz (107.865 kg)  BMI 33.63 kg/m2 Well developed and well nourished in no acute distress HENT normal E scleral and icterus clear Neck Supple JVP flat; carotids brisk and full Clear to ausculation  Regular rate and rhythm, no murmurs gallops or rub Soft with active bowel sounds No clubbing cyanosis  Edema Alert and oriented, grossly normal motor and sensory function Skin Warm and  Dry  ECG demonstrates sinus at 92 Intervals 16/11/38 Voltage criteria for LVH  Assessment and  Plan  Atrial fibrillation  Hypertension  Melanotic stool  Myoview neg  We have reviewed the data on aspirin for his atrial fibrillation; I've recommended that he go back on ELIQUIS.  [facility] is [facility] His bleeding prior to that was small.

## 2015-12-16 NOTE — Patient Instructions (Signed)
Medication Instructions: - Start Eliquis 5 mg take one tablet by mouth twice daily  Labwork: - none  Procedures/Testing: - none  Follow-Up: - Your physician recommends that you schedule a follow-up appointment in: 2 months with Rudi Cocoonna Carroll, NP for Dr. Graciela HusbandsKlein.  Any Additional Special Instructions Will Be Listed Below (If Applicable).     If you need a refill on your cardiac medications before your next appointment, please call your pharmacy.

## 2016-01-10 DIAGNOSIS — N182 Chronic kidney disease, stage 2 (mild): Secondary | ICD-10-CM | POA: Diagnosis not present

## 2016-01-10 DIAGNOSIS — Z0000 Encounter for general adult medical examination without abnormal findings: Secondary | ICD-10-CM | POA: Diagnosis not present

## 2016-01-10 DIAGNOSIS — I4891 Unspecified atrial fibrillation: Secondary | ICD-10-CM | POA: Diagnosis not present

## 2016-01-10 DIAGNOSIS — E559 Vitamin D deficiency, unspecified: Secondary | ICD-10-CM | POA: Diagnosis not present

## 2016-01-10 DIAGNOSIS — Z1212 Encounter for screening for malignant neoplasm of rectum: Secondary | ICD-10-CM | POA: Diagnosis not present

## 2016-01-10 DIAGNOSIS — Z125 Encounter for screening for malignant neoplasm of prostate: Secondary | ICD-10-CM | POA: Diagnosis not present

## 2016-01-10 DIAGNOSIS — I1310 Hypertensive heart and chronic kidney disease without heart failure, with stage 1 through stage 4 chronic kidney disease, or unspecified chronic kidney disease: Secondary | ICD-10-CM | POA: Diagnosis not present

## 2016-01-26 DIAGNOSIS — L0292 Furuncle, unspecified: Secondary | ICD-10-CM | POA: Diagnosis not present

## 2016-02-15 ENCOUNTER — Encounter: Payer: Medicare Other | Attending: Internal Medicine | Admitting: Skilled Nursing Facility1

## 2016-02-15 ENCOUNTER — Encounter: Payer: Self-pay | Admitting: Skilled Nursing Facility1

## 2016-02-15 VITALS — Ht 70.0 in | Wt 235.0 lb

## 2016-02-15 DIAGNOSIS — E119 Type 2 diabetes mellitus without complications: Secondary | ICD-10-CM | POA: Diagnosis not present

## 2016-02-15 NOTE — Progress Notes (Signed)
Patient was seen on 02/15/2016 for the first of a series of three diabetes self-management courses at the [facility].  Patient Education Plan per assessed needs and concerns is to attend four course education program for Diabetes Self Management Education. Pt states he is too scared of needles to ever monitor his glucose.  The following learning objectives were met by the patient during this class:  Describe diabetes  State some common risk factors for diabetes  Defines the role of glucose and insulin  Identifies type of diabetes and pathophysiology  Describe the relationship between diabetes and cardiovascular risk  State the members of the Healthcare Team  States the rationale for glucose monitoring  State when to test glucose  State their individual Target Range  State the importance of logging glucose readings  Describe how to interpret glucose readings  Identifies A1C target  Explain the correlation between A1c and eAG values  State symptoms and treatment of high blood glucose  State symptoms and treatment of low blood glucose  Explain proper technique for glucose testing  Identifies proper sharps disposal  Handouts given during class include:  Living Well with Diabetes book  Carb Counting and Meal Planning book  Meal Plan Card  Carbohydrate guide  Meal planning worksheet  Low Sodium Flavoring Tips  The diabetes portion plate  H8V to eAG Conversion Chart  Diabetes Medications  Diabetes Recommended Care Schedule  Support Group  Diabetes Success Plan  Core Class Satisfaction Survey  Follow-Up Plan:  Attend core 2

## 2016-02-28 ENCOUNTER — Other Ambulatory Visit: Payer: Self-pay | Admitting: *Deleted

## 2016-02-28 MED ORDER — APIXABAN 5 MG PO TABS: 5.0000 mg | ORAL_TABLET | Freq: Two times a day (BID) | ORAL | Status: DC

## 2016-02-29 ENCOUNTER — Encounter: Payer: Medicare Other | Attending: Internal Medicine | Admitting: Skilled Nursing Facility1

## 2016-02-29 ENCOUNTER — Encounter: Payer: Self-pay | Admitting: Skilled Nursing Facility1

## 2016-02-29 DIAGNOSIS — E119 Type 2 diabetes mellitus without complications: Secondary | ICD-10-CM

## 2016-02-29 NOTE — Progress Notes (Signed)

## 2016-03-02 ENCOUNTER — Encounter (HOSPITAL_BASED_OUTPATIENT_CLINIC_OR_DEPARTMENT_OTHER): Payer: Self-pay | Admitting: Emergency Medicine

## 2016-03-02 NOTE — ED Notes (Signed)
Patient was recently placed on a blood thinner. The patient reports that he found blood in his urine starting tonight

## 2016-03-07 ENCOUNTER — Encounter: Payer: Self-pay | Admitting: Skilled Nursing Facility1

## 2016-03-07 ENCOUNTER — Encounter: Payer: Medicare Other | Admitting: Skilled Nursing Facility1

## 2016-03-07 DIAGNOSIS — E119 Type 2 diabetes mellitus without complications: Secondary | ICD-10-CM

## 2016-03-07 NOTE — Progress Notes (Signed)
Patient was seen on 03/07/2016 for the third of a series of three diabetes self-management courses at the [facility]. The following learning objectives were met by the patient during this class:  . State the amount of activity recommended for healthy living . Describe activities suitable for individual needs . Identify ways to regularly incorporate activity into daily life . Identify barriers to activity and ways to over come these barriers  Identify diabetes medications being personally used and their primary action for lowering glucose and possible side effects . Describe role of stress on blood glucose and develop strategies to address psychosocial issues . Identify diabetes complications and ways to prevent them  Explain how to manage diabetes during illness . Evaluate success in meeting personal goal . Establish 2-3 goals that they will plan to diligently work on until they return for the  50-monthfollow-up visit  Goals:   I will count my carb choices at most meals and snacks  I will be active 30 minutes or more 4 times a week  To help manage stress I will  Meet with support group at least 1 times a year  Your patient has identified these potential barriers to change:  Stress  Your patient has identified their diabetes self-care support plan as  [facility] Family Support [facility]:  Attend Monthly Diabetes Support Group as needed or make a future follow up appointment

## 2016-07-03 DIAGNOSIS — I719 Aortic aneurysm of unspecified site, without rupture: Secondary | ICD-10-CM | POA: Diagnosis not present

## 2016-07-03 DIAGNOSIS — N182 Chronic kidney disease, stage 2 (mild): Secondary | ICD-10-CM | POA: Diagnosis not present

## 2016-07-03 DIAGNOSIS — R7309 Other abnormal glucose: Secondary | ICD-10-CM | POA: Diagnosis not present

## 2016-07-03 DIAGNOSIS — I1310 Hypertensive heart and chronic kidney disease without heart failure, with stage 1 through stage 4 chronic kidney disease, or unspecified chronic kidney disease: Secondary | ICD-10-CM | POA: Diagnosis not present

## 2016-07-03 DIAGNOSIS — F17200 Nicotine dependence, unspecified, uncomplicated: Secondary | ICD-10-CM | POA: Diagnosis not present

## 2016-12-27 ENCOUNTER — Other Ambulatory Visit: Payer: Self-pay | Admitting: Internal Medicine

## 2016-12-28 MED ORDER — APIXABAN 5 MG PO TABS: ORAL_TABLET | ORAL | 0 refills | Status: AC

## 2017-02-04 IMAGING — DX DG CHEST 2V
2 series · 2 of 2 positions shown · non-contrast
Comparison: 07/17/2014

CLINICAL DATA: 69-year-old male with acute chest pain.

EXAM:
CHEST  2 VIEW

[w chest pa]
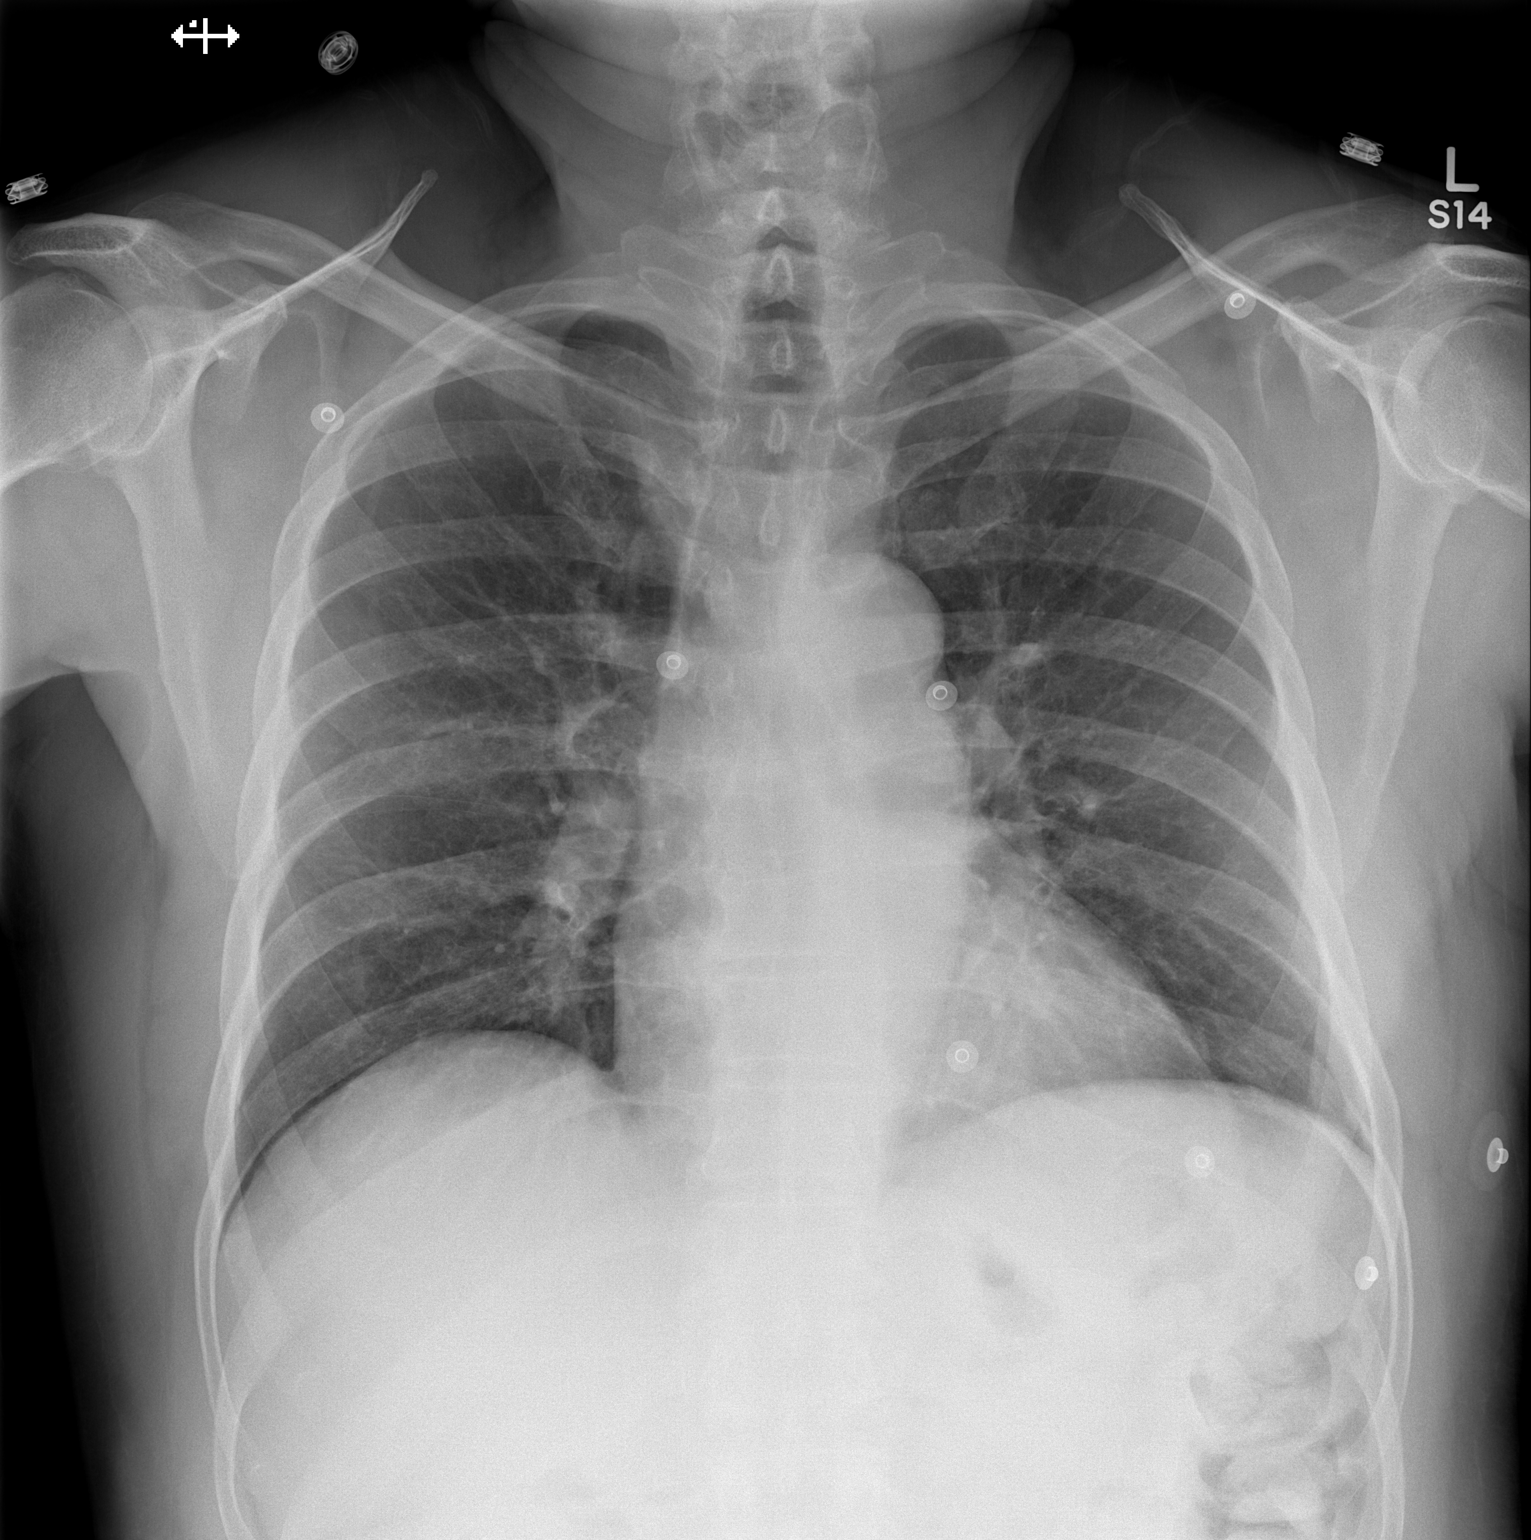

[w chest lat]
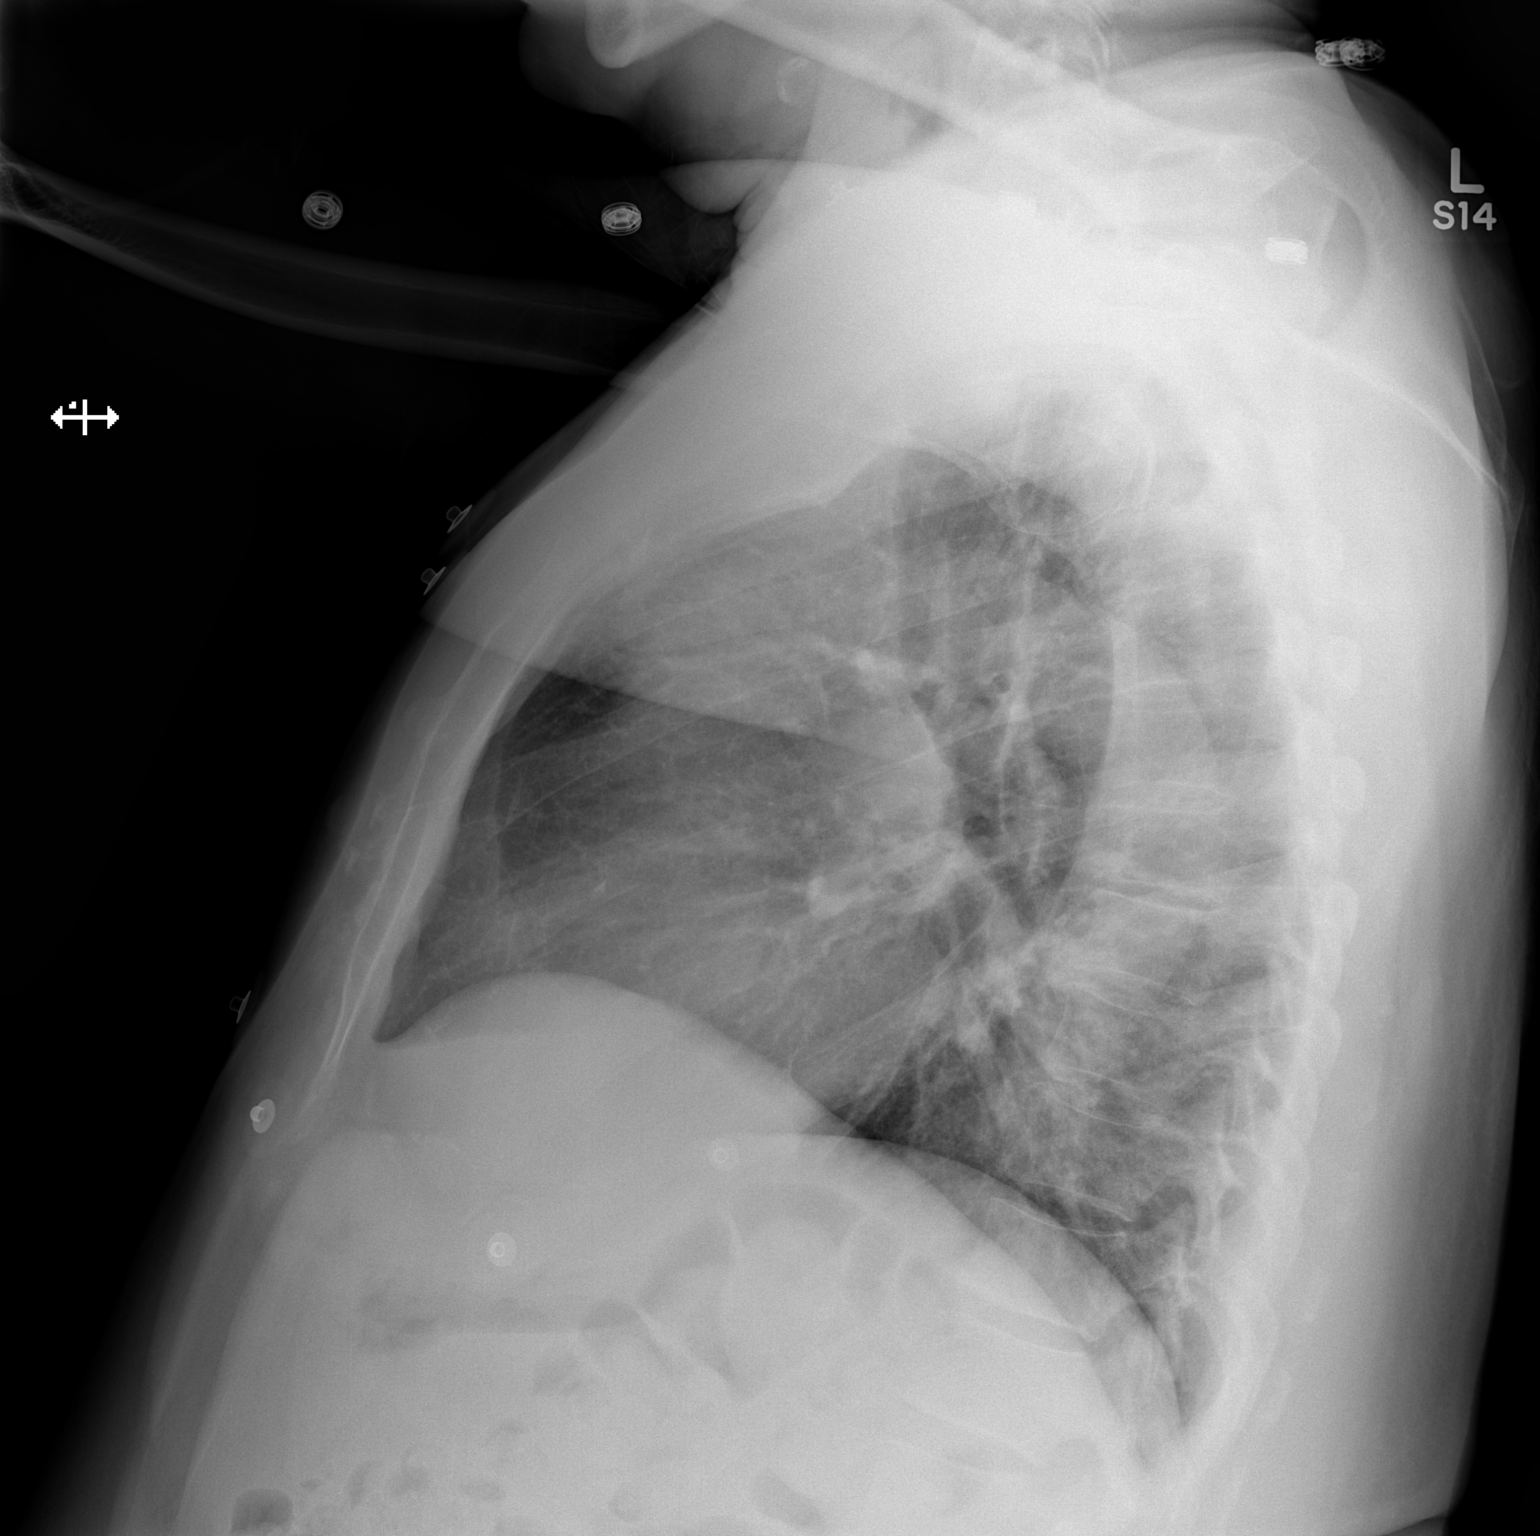

[2 of 2 positions shown; findings below may reference images not displayed]

FINDINGS: Mild cardiomegaly identified.

There is no evidence of focal airspace disease, pulmonary edema,
suspicious pulmonary nodule/mass, pleural effusion, or pneumothorax.
No acute bony abnormalities are identified.
IMPRESSION: Mild cardiomegaly without evidence of acute cardiopulmonary disease.

## 2017-02-09 IMAGING — CR DG CHEST 2V
2 series · 2 of 2 positions shown · non-contrast
Comparison: October 17, 2015.

CLINICAL DATA: Chest pain.

EXAM:
CHEST  2 VIEW

[w chest pa]
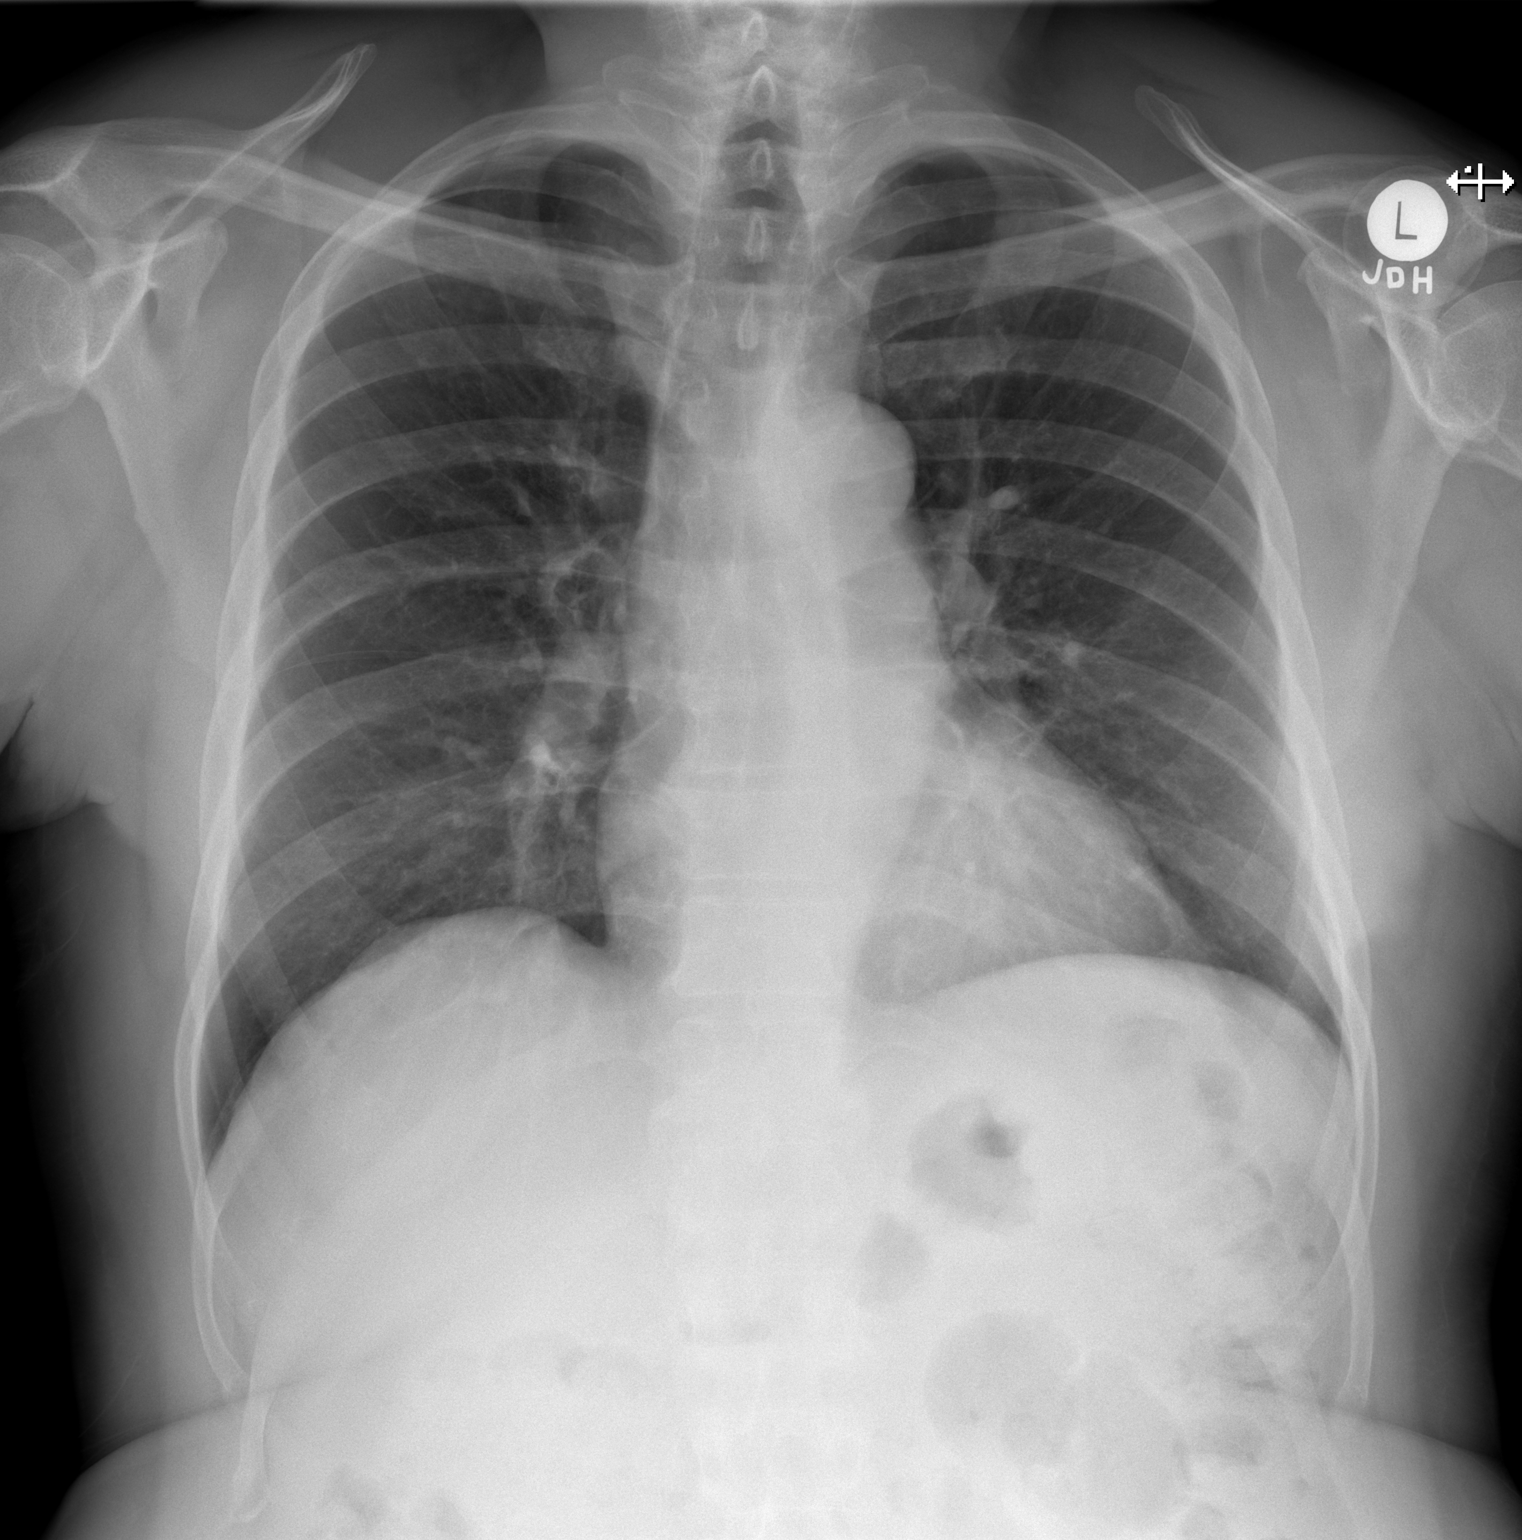

[w chest lat]
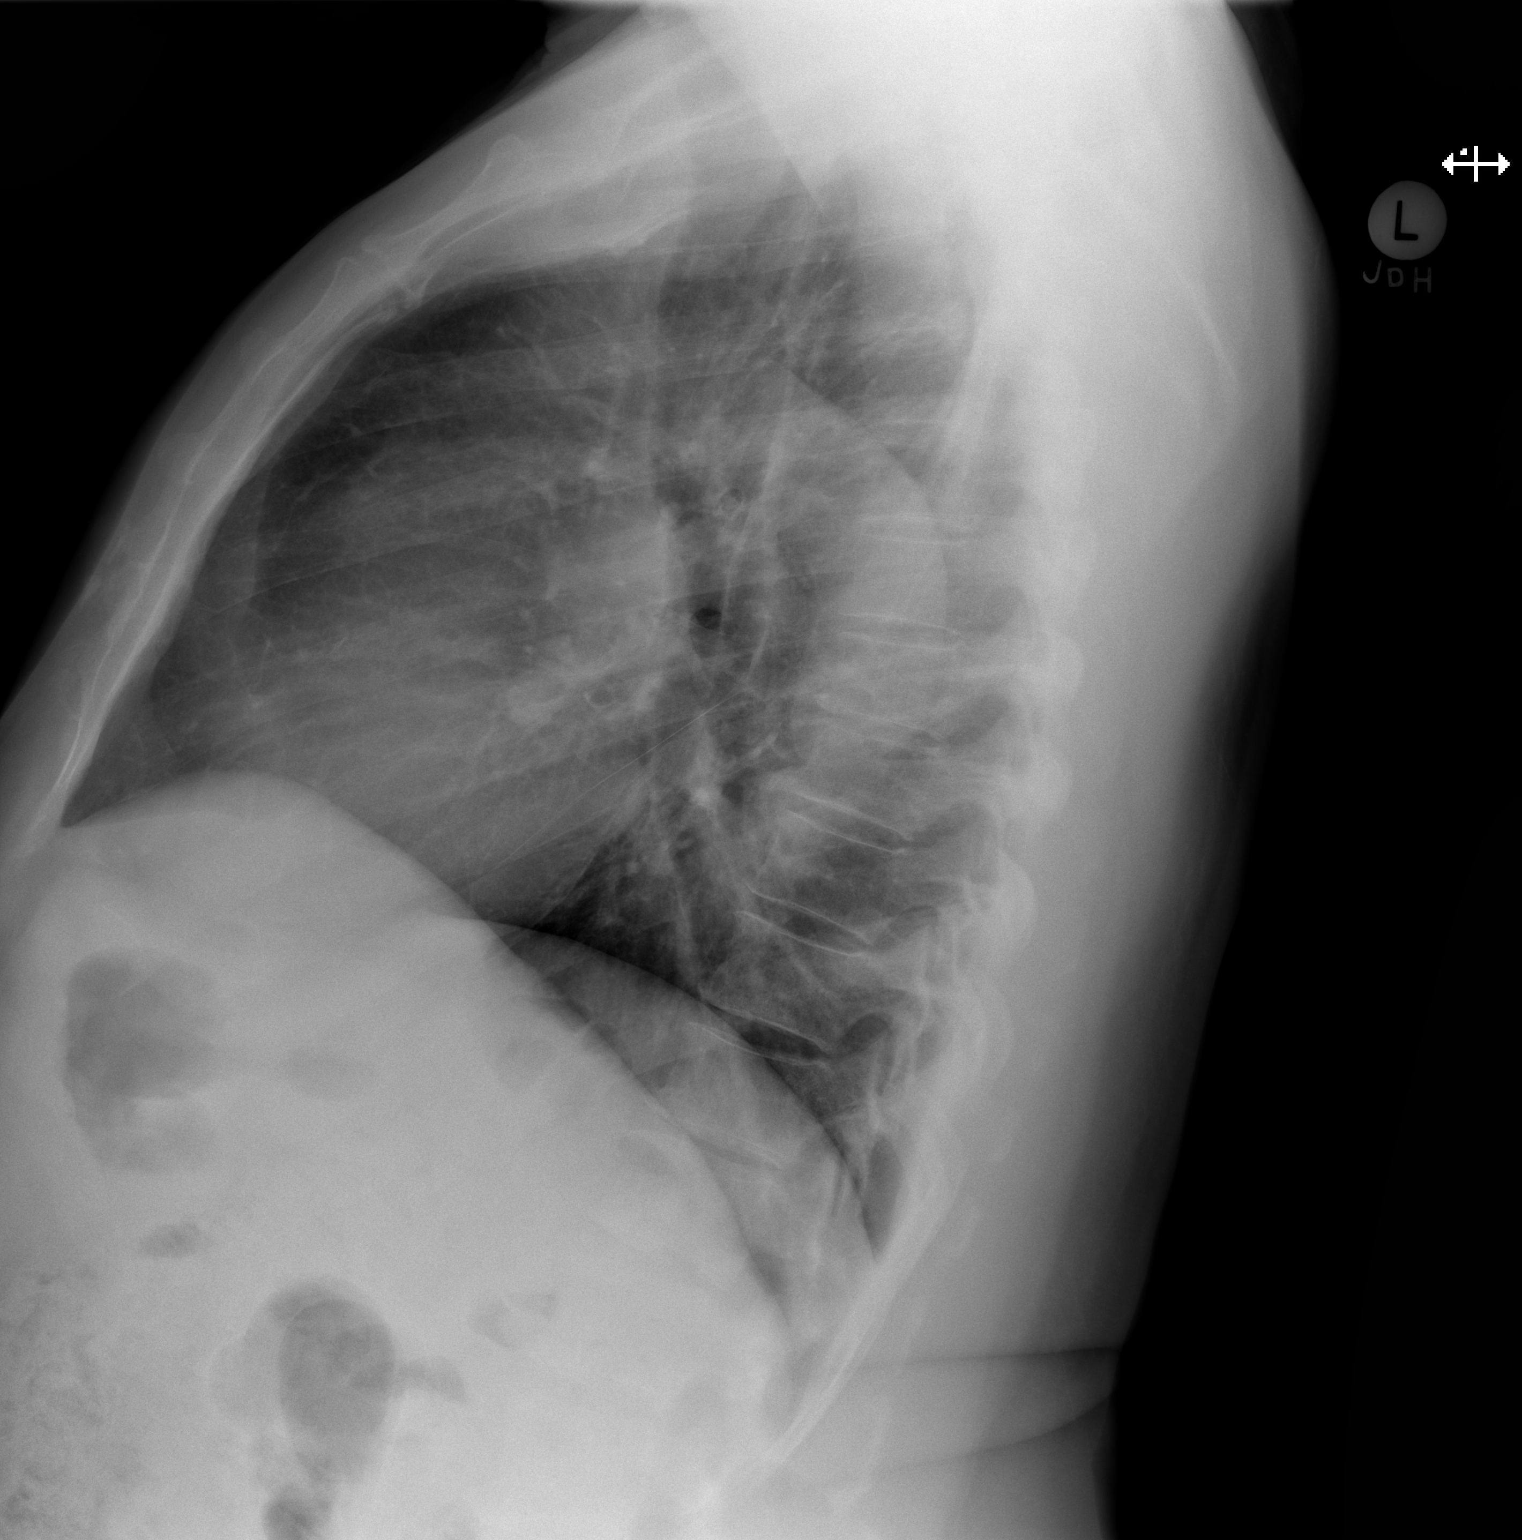

[2 of 2 positions shown; findings below may reference images not displayed]

FINDINGS: The heart size and mediastinal contours are within normal limits.
Both lungs are clear. No pneumothorax or pleural effusion is noted.
The visualized skeletal structures are unremarkable.
IMPRESSION: No active cardiopulmonary disease.

## 2017-03-04 ENCOUNTER — Emergency Department (HOSPITAL_BASED_OUTPATIENT_CLINIC_OR_DEPARTMENT_OTHER)
Admission: EM | Admit: 2017-03-04 | Discharge: 2017-03-04 | Disposition: A | Discharge status: Home / Self Care | Payer: Medicare Other | Attending: Emergency Medicine | Admitting: Emergency Medicine

## 2017-03-04 ENCOUNTER — Encounter (HOSPITAL_BASED_OUTPATIENT_CLINIC_OR_DEPARTMENT_OTHER): Payer: Self-pay

## 2017-03-04 DIAGNOSIS — I10 Essential (primary) hypertension: Secondary | ICD-10-CM | POA: Insufficient documentation

## 2017-03-04 DIAGNOSIS — Z7982 Long term (current) use of aspirin: Secondary | ICD-10-CM | POA: Diagnosis not present

## 2017-03-04 DIAGNOSIS — I119 Hypertensive heart disease without heart failure: Secondary | ICD-10-CM | POA: Diagnosis not present

## 2017-03-04 DIAGNOSIS — Z7901 Long term (current) use of anticoagulants: Secondary | ICD-10-CM | POA: Insufficient documentation

## 2017-03-04 DIAGNOSIS — F17200 Nicotine dependence, unspecified, uncomplicated: Secondary | ICD-10-CM | POA: Diagnosis not present

## 2017-03-04 DIAGNOSIS — K625 Hemorrhage of anus and rectum: Secondary | ICD-10-CM | POA: Diagnosis not present

## 2017-03-04 LAB — CBC WITH DIFFERENTIAL/PLATELET
Basophils Absolute: 0 10*3/uL (ref 0.0–0.1)
Basophils Relative: 0 %
Eosinophils Absolute: 0.1 10*3/uL (ref 0.0–0.7)
Eosinophils Relative: 2 %
HCT: 35.2 % — ABNORMAL LOW (ref 39.0–52.0)
Hemoglobin: 11.7 g/dL — ABNORMAL LOW (ref 13.0–17.0)
Lymphocytes Relative: 29 %
Lymphs Abs: 1.1 10*3/uL (ref 0.7–4.0)
MCH: 27.7 pg (ref 26.0–34.0)
MCHC: 33.2 g/dL (ref 30.0–36.0)
MCV: 83.4 fL (ref 78.0–100.0)
Monocytes Absolute: 0.5 10*3/uL (ref 0.1–1.0)
Monocytes Relative: 13 %
Neutro Abs: 2.2 10*3/uL (ref 1.7–7.7)
Neutrophils Relative %: 56 %
Platelets: 237 10*3/uL (ref 150–400)
RBC: 4.22 MIL/uL (ref 4.22–5.81)
RDW: 16.8 % — ABNORMAL HIGH (ref 11.5–15.5)
WBC: 3.9 10*3/uL — ABNORMAL LOW (ref 4.0–10.5)

## 2017-03-04 LAB — BASIC METABOLIC PANEL
Anion gap: 9 (ref 5–15)
BUN: 15 mg/dL (ref 6–20)
CO2: 26 mmol/L (ref 22–32)
Calcium: 9.1 mg/dL (ref 8.9–10.3)
Chloride: 103 mmol/L (ref 101–111)
Creatinine, Ser: 1.16 mg/dL (ref 0.61–1.24)
GFR calc Af Amer: >60 mL/min (ref >60)
GFR calc non Af Amer: >60 mL/min (ref >60)
Glucose, Bld: 98 mg/dL (ref 65–99)
Potassium: 2.9 mmol/L — ABNORMAL LOW (ref 3.5–5.1)
Sodium: 138 mmol/L (ref 135–145)

## 2017-03-04 LAB — OCCULT BLOOD X 1 CARD TO LAB, STOOL: Fecal Occult Bld: NEGATIVE

## 2017-03-04 NOTE — ED Notes (Signed)
Patient claimed that he goes to the bathroom for bowel movement 3 times a week.  He stated that he see more blood on his stools than he ever wanted to see.  The bloody stools started Wednesday.  Patient is on Eliquis for A-fib.

## 2017-03-04 NOTE — ED Notes (Signed)
ED Provider at bedside. 

## 2017-03-04 NOTE — ED Provider Notes (Signed)
[facility]   CSN: [id number] Arrival date & time: 03/04/17  1658  By signing my name below, I, Rosana Fret, attest that this documentation has been prepared under the direction and in the presence of Geoffery Lyons, MD. Electronically Signed: Rosana Fret, ED Scribe. 03/04/17. 5:51 PM.  History   Chief Complaint Chief Complaint  Patient presents with  . Rectal Bleeding   The history is provided by the patient. No language interpreter was used.  Rectal Bleeding  Quality:  Bright red Amount:  Moderate Duration:  4 days Chronicity:  New Context: constipation   Relieved by:  None tried Worsened by:  Nothing Ineffective treatments:  None tried Associated symptoms: no abdominal pain, no dizziness and no light-headedness    HPI Comments: Richard Sampson is a 71 y.o. male with a PMHx of HTN, HLD and Afib, who presents to the Emergency Department complaining of moderate rectal bleeding onset 4 days ago. Pt takes the bleeding is bright red and only occurs when he has a BM. Per pt, total of 3 episodes. Pt reports constipation before the 1st episode. Pt had a colonoscopy in the past 1-2 years where they removed some polyps. Pt takes Eliquis. Pt denies abdominal pain, melena, rectal pain, light-headed, dizziness, SOB or any other complaints at this time.  Past Medical History:  Diagnosis Date  . A-fib (HCC) 07/17/2014  . High cholesterol   . Hypertension     Patient Active Problem List   Diagnosis Date Noted  . Angioedema 10/20/2015  . GI bleed 10/20/2015  . GERD (gastroesophageal reflux disease) 10/20/2015  . 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 Tobacco abuse 10/18/2015  . Acute kidney injury (HCC) 07/18/2014  . PAF 2015 07/18/2014  . Chest pain with moderate risk of acute coronary syndrome 07/17/2014  . HLD (hyperlipidemia) 07/17/2014  . 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 Hypokalemia 03/16/2013  . Angio-edema-secondary to ACE 03/16/2013    Past Surgical History:  Procedure Laterality Date  . TONSILLECTOMY         Home Medications    Prior to Admission medications   Medication Sig Start Date End Date Taking? Authorizing Provider  ammonium lactate (LAC-HYDRIN) 12 % lotion Apply 1 application topically 2 (two) times daily.    [provider]  apixaban (ELIQUIS) 5 MG TABS tablet Take 1 tablet by mouth 2 times a day. Need to schedule appt with cardiologist for refills. 12/28/16   Duke Salvia, MD  aspirin EC 81 MG tablet Take 1 tablet (81 mg total) by mouth daily. 10/20/15   Abelino Derrick, PA-C  diclofenac sodium (VOLTAREN) 1 % GEL Apply 2 g topically 3 (three) times daily as needed.    [provider]  diltiazem (TIAZAC) 180 MG 24 hr capsule Take 180 mg by mouth 2 (two) times daily. 08/23/15   [provider]  DULoxetine (CYMBALTA) 60 MG capsule Take 60 mg by mouth daily. 09/20/15   [provider]  omeprazole (PRILOSEC) 20 MG capsule Take 1 capsule (20 mg total) by mouth daily. 10/20/15   Abelino Derrick, PA-C  potassium chloride SA (K-DUR,KLOR-CON) 20 MEQ tablet Take 20 mEq by mouth 2 (two) times daily. Morning and evening    [provider]  triamterene-hydrochlorothiazide (MAXZIDE) 75-50 MG per tablet Take 0.5 tablets by mouth every evening.     [provider]    Family History Family History  Problem Relation Age  of Onset  . Heart failure Father   . Cancer Father   . Kidney disease Son   . Heart attack Neg Hx   . Stroke Neg Hx     Social History Social History  Substance Use Topics  . Smoking status: Current Every Day Smoker  . Smokeless tobacco: Never Used  . Alcohol use Yes     Allergies   Ace inhibitors; Angiotensin receptor blockers; Lisinopril; and Losartan   Review of Systems Review of Systems  Respiratory: Negative for shortness of breath.   Gastrointestinal:  Positive for blood in stool, constipation and hematochezia. Negative for abdominal pain and rectal pain.  Neurological: Negative for dizziness and light-headedness.  All other systems reviewed and are negative.    Physical Exam Updated Vital Signs BP (!) 145/95 (BP Location: Left Arm)   Pulse (!) 108   Temp 98.4 F (36.9 C) (Oral)   Resp 18   Ht 5\' 10"  (1.778 m)   Wt 230 lb (104.3 kg)   SpO2 98%   BMI 33.00 kg/m   Physical Exam  Constitutional: He is oriented to person, place, and time. He appears well-developed and well-nourished.  HENT:  Head: Normocephalic.  Eyes: EOM are normal.  Neck: Normal range of motion.  Cardiovascular: Normal rate, regular rhythm and normal heart sounds.   Pulmonary/Chest: Effort normal and breath sounds normal. No respiratory distress. He has no wheezes. He has no rales.  Abdominal: He exhibits no distension.  Genitourinary: Rectum normal.  Genitourinary Comments: There are no obvious hemorrhoids noted. No fissure identified. DTE exam with brown stool and otherwise unremarkable.   Musculoskeletal: Normal range of motion.  Neurological: He is alert and oriented to person, place, and time.  Psychiatric: He has a normal mood and affect.  Nursing note and vitals reviewed.    ED Treatments / Results  DIAGNOSTIC STUDIES: Oxygen Saturation is 98% on RA, normal by my interpretation.   COORDINATION OF CARE: 5:44 PM-Discussed next steps with pt including blood work. Pt verbalized understanding and is agreeable with the plan.   Labs (all labs ordered are listed, but only abnormal results are displayed) Labs Reviewed  OCCULT BLOOD X 1 CARD TO LAB, STOOL  BASIC METABOLIC PANEL  CBC WITH DIFFERENTIAL/PLATELET    EKG  EKG Interpretation None       Radiology No results found.  Procedures Procedures (including critical care time)  Medications Ordered in ED Medications - No data to display   Initial Impression / Assessment and Plan /  ED Course  I have reviewed the triage vital signs and the nursing notes.  Pertinent labs & imaging results that were available during my care of the patient were reviewed by me and considered in my medical decision making (see chart for details).  Patient presents with complaints of several episodes of rectal bleeding over the past several days. This started after passing a hard stool while he was out of town. Today he had more bleeding and presents for evaluation of this. He denies any rectal pain, fever, or abdominal pain.  He reports having a colonoscopy about a year and a half ago that revealed no acute abnormality. His rectal examination reveals no obvious hemorrhoids or fissures. He has brown, heme-negative stool on digital rectal exam. I highly suspect some sort of trauma relating from his hard stool. He is taking eliquis, but does not appear to be actively bleeding. Hemoglobin is 11.7, he has no white count, and he has not had any bleeding  while in the emergency department. He will be discharged with stool softeners and follow-up with GI later this week.  Final Clinical Impressions(s) / ED Diagnoses   Final diagnoses:  None    New Prescriptions New Prescriptions   No medications on file   I personally performed the services described in this documentation, which was scribed in my presence. The recorded information has been reviewed and is accurate.        Geoffery Lyons, MD 03/04/17 [phone]

## 2017-03-04 NOTE — Discharge Instructions (Signed)
Metamucil: 1 heaping teaspoon in a glass of water 3 times daily.  Colace (equate stool softener): 100 mg twice daily.  Follow-up later this week with gastroenterology. The contact information for [facility] has been provided in this discharge summary for you to call and make these arrangements.  Return to the emergency department in the meantime if your symptoms significantly worsen or change.

## 2017-03-04 NOTE — ED Triage Notes (Signed)
Pt reports bright rectal bleeding since Thursday. Pt is on Eliquis.

## 2018-07-30 ENCOUNTER — Emergency Department (HOSPITAL_BASED_OUTPATIENT_CLINIC_OR_DEPARTMENT_OTHER): Payer: Medicare Other

## 2018-07-30 ENCOUNTER — Encounter (HOSPITAL_BASED_OUTPATIENT_CLINIC_OR_DEPARTMENT_OTHER): Payer: Self-pay

## 2018-07-30 ENCOUNTER — Other Ambulatory Visit: Payer: Self-pay

## 2018-07-30 ENCOUNTER — Emergency Department (HOSPITAL_BASED_OUTPATIENT_CLINIC_OR_DEPARTMENT_OTHER)
Admission: EM | Admit: 2018-07-30 | Discharge: 2018-07-30 | Disposition: A | Discharge status: Home / Self Care | Payer: Medicare Other | Attending: Emergency Medicine | Admitting: Emergency Medicine

## 2018-07-30 DIAGNOSIS — J069 Acute upper respiratory infection, unspecified: Secondary | ICD-10-CM | POA: Diagnosis not present

## 2018-07-30 DIAGNOSIS — R05 Cough: Secondary | ICD-10-CM | POA: Diagnosis present

## 2018-07-30 DIAGNOSIS — Z79899 Other long term (current) drug therapy: Secondary | ICD-10-CM | POA: Insufficient documentation

## 2018-07-30 DIAGNOSIS — Z7901 Long term (current) use of anticoagulants: Secondary | ICD-10-CM | POA: Insufficient documentation

## 2018-07-30 DIAGNOSIS — Z7982 Long term (current) use of aspirin: Secondary | ICD-10-CM | POA: Insufficient documentation

## 2018-07-30 DIAGNOSIS — F17210 Nicotine dependence, cigarettes, uncomplicated: Secondary | ICD-10-CM | POA: Diagnosis not present

## 2018-07-30 DIAGNOSIS — I10 Essential (primary) hypertension: Secondary | ICD-10-CM | POA: Diagnosis not present

## 2018-07-30 DIAGNOSIS — B9789 Other viral agents as the cause of diseases classified elsewhere: Secondary | ICD-10-CM

## 2018-07-30 HISTORY — DX: Pneumonia, unspecified organism: J18.9

## 2018-07-30 MED ORDER — BENZONATATE 100 MG PO CAPS: 100.0000 mg | ORAL_CAPSULE | Freq: Three times a day (TID) | ORAL | 0 refills | Status: DC

## 2018-07-30 MED ORDER — BENZONATATE 100 MG PO CAPS
100.0000 mg | ORAL_CAPSULE | Freq: Once | ORAL | Status: AC
  Administered 2018-07-30: 100 mg via ORAL
  Filled 2018-07-30: qty 1

## 2018-07-30 MED FILL — BENZONATATE 100 MG CAP: 100 | 7 days supply | Qty: 21 | Fill #0

## 2018-07-30 NOTE — ED Provider Notes (Signed)
[facility] EMERGENCY DEPARTMENT Provider Note   CSN: [id number] Arrival date & time: 07/30/18  1358     History   Chief Complaint Chief Complaint  Patient presents with  . Cough    HPI Richard Sampson is a 72 y.o. male.  Richard Sampson is a 72 y.o. Male with a history of A. fib, hyperlipidemia, hypertension and pneumonia, who presents to the emergency department for evaluation of 4 days of cough, congestion and rhinorrhea.  Since onset symptoms have been constant and worsening.  He reports that the last time he had similar symptoms he was diagnosed with pneumonia about 1.  He has had a few chills but denies fevers.  Cough is occasionally productive and cough is definitely worse at night.  He denies associated chest pain or shortness of breath, no abdominal pain, nausea or vomiting.  No known sick contacts.  Patient has been using some over-the-counter medications with some improvement in his symptoms but is continued to have cough and was concerned that he could be developing pneumonia once again.     Past Medical History:  Diagnosis Date  . A-fib (HCC) 07/17/2014  . High cholesterol   . Hypertension   . Pneumonia     Patient Active Problem List   Diagnosis Date Noted  . Angioedema 10/20/2015  . GI bleed 10/20/2015  . GERD (gastroesophageal reflux disease) 10/20/2015  . 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 Tobacco abuse 10/18/2015  . Acute kidney injury (HCC) 07/18/2014  . PAF 2015 07/18/2014  . Chest pain with moderate risk of acute coronary syndrome 07/17/2014  . HLD (hyperlipidemia) 07/17/2014  . 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 Hypokalemia 03/16/2013  . Angio-edema-secondary to ACE 03/16/2013    Past Surgical History:  Procedure Laterality Date  . TONSILLECTOMY          Home Medications    Prior to Admission medications   Medication Sig Start Date End Date  Taking? Authorizing Provider  ammonium lactate (LAC-HYDRIN) 12 % lotion Apply 1 application topically 2 (two) times daily.    [provider]  apixaban (ELIQUIS) 5 MG TABS tablet Take 1 tablet by mouth 2 times a day. Need to schedule appt with cardiologist for refills. 12/28/16   Duke Salvia, MD  aspirin EC 81 MG tablet Take 1 tablet (81 mg total) by mouth daily. 10/20/15   Abelino Derrick, PA-C  diclofenac sodium (VOLTAREN) 1 % GEL Apply 2 g topically 3 (three) times daily as needed.    [provider]  diltiazem (TIAZAC) 180 MG 24 hr capsule Take 180 mg by mouth 2 (two) times daily. 08/23/15   [provider]  DULoxetine (CYMBALTA) 60 MG capsule Take 60 mg by mouth daily. 09/20/15   [provider]  omeprazole (PRILOSEC) 20 MG capsule Take 1 capsule (20 mg total) by mouth daily. 10/20/15   Abelino Derrick, PA-C  potassium chloride SA (K-DUR,KLOR-CON) 20 MEQ tablet Take 20 mEq by mouth 2 (two) times daily. Morning and evening    [provider]  triamterene-hydrochlorothiazide (MAXZIDE) 75-50 MG per tablet Take 0.5 tablets by mouth every evening.     [provider]    Family History Family History  Problem Relation Age of Onset  . Heart failure Father   . Cancer Father   . Kidney disease Son   . Heart attack Neg Hx   . Stroke Neg Hx  Social History Social History   Tobacco Use  . Smoking status: Current Every Day Smoker    Types: Cigarettes  . Smokeless tobacco: Never Used  Substance Use Topics  . Alcohol use: Yes    Comment: occ  . Drug use: Never     Allergies   Ace inhibitors; Angiotensin receptor blockers; Lisinopril; and Losartan   Review of Systems Review of Systems  Constitutional: Positive for chills. Negative for fever.  HENT: Positive for congestion, postnasal drip, rhinorrhea and sore throat. Negative for ear pain.   Respiratory: Positive for cough. Negative for chest tightness, shortness of breath and  wheezing.   Cardiovascular: Negative for chest pain.  Gastrointestinal: Negative for abdominal pain, nausea and vomiting.  Musculoskeletal: Negative for arthralgias and myalgias.  Skin: Negative for color change and rash.  Neurological: Negative for dizziness, syncope and light-headedness.     Physical Exam Updated Vital Signs BP 126/88 (BP Location: Left Arm)   Pulse 81   Temp 98.3 F (36.8 C) (Oral)   Resp 20   Ht 5\' 9"  (1.753 m)   Wt 103.9 kg   SpO2 96%   BMI 33.82 kg/m   Physical Exam  Constitutional: He appears well-developed and well-nourished. He does not appear ill. No distress.  HENT:  Head: Normocephalic and atraumatic.  Mouth/Throat: Oropharynx is clear and moist.  TMs clear with good landmarks, moderate nasal mucosa edema with clear rhinorrhea, posterior oropharynx clear and moist, with some erythema, no edema or exudates, uvula midline  Eyes: Right eye exhibits no discharge. Left eye exhibits no discharge.  Neck: Neck supple.  No rigidity  Cardiovascular: Normal rate, regular rhythm, normal heart sounds and intact distal pulses.  Pulmonary/Chest: Effort normal and breath sounds normal. No respiratory distress.  Respirations equal and unlabored, patient able to speak in full sentences, lungs clear to auscultation bilaterally, occasional cough during exam  Abdominal: Soft. Bowel sounds are normal. He exhibits no distension and no mass. There is no tenderness. There is no guarding.  Abdomen soft, nondistended, nontender to palpation in all quadrants without guarding or peritoneal signs  Musculoskeletal: He exhibits no deformity.  Lymphadenopathy:    He has no cervical adenopathy.  Neurological: He is alert.  Skin: Skin is warm and dry. Capillary refill takes less than 2 seconds. He is not diaphoretic.  Psychiatric: He has a normal mood and affect. His behavior is normal.  Nursing note and vitals reviewed.    ED Treatments / Results  Labs (all labs ordered are  listed, but only abnormal results are displayed) Labs Reviewed - No data to display  EKG None  Radiology Dg Chest 2 View  Result Date: 07/30/2018 CLINICAL DATA:  Cough, chest pain for 4 days, smoking history EXAM: CHEST - 2 VIEW COMPARISON:  Chest x-ray of 10/22/2015 FINDINGS: No active infiltrate or effusion is seen. Mediastinal and hilar contours are unremarkable. The heart is mildly enlarged and stable. There are mild degenerative changes in the thoracic spine. IMPRESSION: Stable mild cardiomegaly.  No active lung disease. Electronically Signed   By: Dwyane DeePaul  Barry M.D.   On: 07/30/2018 14:27    Procedures Procedures (including critical care time)  Medications Ordered in ED Medications  benzonatate (TESSALON) capsule 100 mg (100 mg Oral Given 07/30/18 1629)     Initial Impression / Assessment and Plan / ED Course  I have reviewed the triage vital signs and the nursing notes.  Pertinent labs & imaging results that were available during my care of the patient  were reviewed by me and considered in my medical decision making (see chart for details).  Pt presents with nasal congestion and cough. Pt is well appearing and vitals are normal. Lungs CTA on exam. Pt CXR negative for acute infiltrate. Patients symptoms are consistent with URI, likely viral etiology. Discussed that antibiotics are not indicated for viral infections. Pt will be discharged with symptomatic treatment.  Verbalizes understanding and is agreeable with plan. Pt is hemodynamically stable & in NAD prior to dc.   Final Clinical Impressions(s) / ED Diagnoses   Final diagnoses:  Viral URI with cough    ED Discharge Orders         Ordered    benzonatate (TESSALON) 100 MG capsule  Every 8 hours     07/30/18 1625           Dartha Lodge, [place] 07/30/18 1949    Tegeler, Canary Brim, MD 07/31/18 [phone]

## 2018-07-30 NOTE — Discharge Instructions (Signed)
Your symptoms are likely caused by a viral upper respiratory infection. Antibiotics are not helpful in treating viral infection, the virus should run its course in about 5-7 days. Please make sure you are drinking plenty of fluids. You can treat your symptoms supportively with tylenol/ibuprofen for fevers and pains, Zyrtec and Flonase to help with nasal congestion, and prescribed cough medication and throat lozenges to help with cough. If your symptoms are not improving please follow up with you Primary doctor.  ° °If you develop persistent fevers, shortness of breath or difficulty breathing, chest pain, severe headache and neck pain, persistent nausea and vomiting or other new or concerning symptoms return to the Emergency department. ° °

## 2018-07-30 NOTE — ED Triage Notes (Addendum)
C/o flu like sx x 4 days-states last time he felt that way he had PNA-NAD-steady gait

## 2018-08-21 ENCOUNTER — Emergency Department (HOSPITAL_BASED_OUTPATIENT_CLINIC_OR_DEPARTMENT_OTHER): Payer: Medicare Other

## 2018-08-21 ENCOUNTER — Emergency Department (HOSPITAL_BASED_OUTPATIENT_CLINIC_OR_DEPARTMENT_OTHER)
Admission: EM | Admit: 2018-08-21 | Discharge: 2018-08-21 | Disposition: A | Discharge status: Home / Self Care | Payer: Medicare Other | Attending: Emergency Medicine | Admitting: Emergency Medicine

## 2018-08-21 ENCOUNTER — Other Ambulatory Visit: Payer: Self-pay

## 2018-08-21 ENCOUNTER — Encounter (HOSPITAL_BASED_OUTPATIENT_CLINIC_OR_DEPARTMENT_OTHER): Payer: Self-pay

## 2018-08-21 DIAGNOSIS — F17210 Nicotine dependence, cigarettes, uncomplicated: Secondary | ICD-10-CM | POA: Diagnosis not present

## 2018-08-21 DIAGNOSIS — Z7982 Long term (current) use of aspirin: Secondary | ICD-10-CM | POA: Insufficient documentation

## 2018-08-21 DIAGNOSIS — R05 Cough: Secondary | ICD-10-CM | POA: Diagnosis present

## 2018-08-21 DIAGNOSIS — Z7901 Long term (current) use of anticoagulants: Secondary | ICD-10-CM | POA: Insufficient documentation

## 2018-08-21 DIAGNOSIS — J209 Acute bronchitis, unspecified: Secondary | ICD-10-CM | POA: Diagnosis not present

## 2018-08-21 DIAGNOSIS — I10 Essential (primary) hypertension: Secondary | ICD-10-CM | POA: Insufficient documentation

## 2018-08-21 DIAGNOSIS — Z79899 Other long term (current) drug therapy: Secondary | ICD-10-CM | POA: Diagnosis not present

## 2018-08-21 MED ORDER — BENZONATATE 100 MG PO CAPS: 100.0000 mg | ORAL_CAPSULE | Freq: Three times a day (TID) | ORAL | 0 refills | Status: DC

## 2018-08-21 MED ORDER — PREDNISONE 20 MG PO TABS: 40.0000 mg | ORAL_TABLET | Freq: Every day | ORAL | 0 refills | Status: DC

## 2018-08-21 MED ORDER — PREDNISONE 50 MG PO TABS
60.0000 mg | ORAL_TABLET | Freq: Once | ORAL | Status: AC
  Administered 2018-08-21: 60 mg via ORAL
  Filled 2018-08-21: qty 1

## 2018-08-21 MED ORDER — ALBUTEROL SULFATE HFA 108 (90 BASE) MCG/ACT IN AERS
2.0000 | INHALATION_SPRAY | RESPIRATORY_TRACT | Status: DC | PRN
  Administered 2018-08-21: 2 via RESPIRATORY_TRACT
  Filled 2018-08-21: qty 6.7

## 2018-08-21 NOTE — ED Notes (Signed)
ED Provider at bedside. 

## 2018-08-21 NOTE — Discharge Instructions (Signed)
Use albuterol before bed and when you are having coughing fits.  Also use saline spray for nose and drainage.

## 2018-08-21 NOTE — ED Notes (Signed)
Patient transported to X-ray 

## 2018-08-21 NOTE — ED Provider Notes (Signed)
[facility] EMERGENCY DEPARTMENT Provider Note   CSN: [id number] Arrival date & time: 08/21/18  1806     History   Chief Complaint Chief Complaint  Patient presents with  . Cough    HPI Richard Sampson is a 73 y.o. male.  The history is provided by the patient.  Cough  This is a new problem. Episode onset: 3 weeks. The problem occurs constantly. The problem has not changed since onset.The cough is non-productive. There has been no fever. Associated symptoms include rhinorrhea, shortness of breath and wheezing. Associated symptoms comments: No chest pain, leg swelling or fever.  Clear nasal discharge.  Coughing worse at night.. He has tried cough syrup for the symptoms. The treatment provided mild relief. He is a smoker. His past medical history is significant for pneumonia.    Past Medical History:  Diagnosis Date  . A-fib (HCC) 07/17/2014  . High cholesterol   . Hypertension   . Pneumonia     Patient Active Problem List   Diagnosis Date Noted  . Angioedema 10/20/2015  . GI bleed 10/20/2015  . GERD (gastroesophageal reflux disease) 10/20/2015  . 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 Tobacco abuse 10/18/2015  . Acute kidney injury (HCC) 07/18/2014  . PAF 2015 07/18/2014  . Chest pain with moderate risk of acute coronary syndrome 07/17/2014  . HLD (hyperlipidemia) 07/17/2014  . 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 Hypokalemia 03/16/2013  . Angio-edema-secondary to ACE 03/16/2013    Past Surgical History:  Procedure Laterality Date  . TONSILLECTOMY          Home Medications    Prior to Admission medications   Medication Sig Start Date End Date Taking? Authorizing Provider  ammonium lactate (LAC-HYDRIN) 12 % lotion Apply 1 application topically 2 (two) times daily.    [provider]  apixaban (ELIQUIS) 5 MG TABS tablet Take 1 tablet by mouth 2 times a day. Need  to schedule appt with cardiologist for refills. 12/28/16   Duke SalviaKlein, Steven C, MD  aspirin EC 81 MG tablet Take 1 tablet (81 mg total) by mouth daily. 10/20/15   Abelino DerrickKilroy, Luke K, PA-C  benzonatate (TESSALON) 100 MG capsule Take 1 capsule (100 mg total) by mouth every 8 (eight) hours. 07/30/18   Dartha LodgeFord, Kelsey N, PA-C  diclofenac sodium (VOLTAREN) 1 % GEL Apply 2 g topically 3 (three) times daily as needed.    [provider]  diltiazem (TIAZAC) 180 MG 24 hr capsule Take 180 mg by mouth 2 (two) times daily. 08/23/15   [provider]  DULoxetine (CYMBALTA) 60 MG capsule Take 60 mg by mouth daily. 09/20/15   [provider]  omeprazole (PRILOSEC) 20 MG capsule Take 1 capsule (20 mg total) by mouth daily. 10/20/15   Abelino DerrickKilroy, Luke K, PA-C  potassium chloride SA (K-DUR,KLOR-CON) 20 MEQ tablet Take 20 mEq by mouth 2 (two) times daily. Morning and evening    [provider]  triamterene-hydrochlorothiazide (MAXZIDE) 75-50 MG per tablet Take 0.5 tablets by mouth every evening.     [provider]    Family History Family History  Problem Relation Age of Onset  . Heart failure Father   . Cancer Father   . Kidney disease Son   . Heart attack Neg Hx   . Stroke Neg Hx     Social History Social History   Tobacco Use  . Smoking status: Current Every Day Smoker  Types: Cigarettes  . Smokeless tobacco: Never Used  Substance Use Topics  . Alcohol use: Yes    Comment: occ  . Drug use: Never     Allergies   Ace inhibitors; Angiotensin receptor blockers; Lisinopril; and Losartan   Review of Systems Review of Systems  HENT: Positive for rhinorrhea.   Respiratory: Positive for cough, shortness of breath and wheezing.   All other systems reviewed and are negative.    Physical Exam Updated Vital Signs BP 127/87 (BP Location: Right Arm)   Pulse 90   Temp 98.6 F (37 C) (Oral)   Resp 20   Ht 5\' 10"  (1.778 m)   Wt 104.8 kg   SpO2 98%   BMI 33.15 kg/m    Physical Exam Vitals signs and nursing note reviewed.  Constitutional:      General: He is not in acute distress.    Appearance: He is well-developed.  HENT:     Head: Normocephalic and atraumatic.     Nose: Congestion present.  Eyes:     Conjunctiva/sclera: Conjunctivae normal.     Pupils: Pupils are equal, round, and reactive to light.  Neck:     Musculoskeletal: Normal range of motion and neck supple.  Cardiovascular:     Rate and Rhythm: Normal rate and regular rhythm.     Heart sounds: No murmur.  Pulmonary:     Effort: Pulmonary effort is normal. No respiratory distress.     Breath sounds: Normal breath sounds. No wheezing or rales.  Abdominal:     General: There is no distension.     Palpations: Abdomen is soft.     Tenderness: There is no abdominal tenderness. There is no guarding or rebound.  Musculoskeletal: Normal range of motion.        General: No tenderness.  Skin:    General: Skin is warm and dry.     Findings: No erythema or rash.  Neurological:     Mental Status: He is alert and oriented to person, place, and time.  Psychiatric:        Behavior: Behavior normal.      ED Treatments / Results  Labs (all labs ordered are listed, but only abnormal results are displayed) Labs Reviewed - No data to display  EKG None  Radiology Dg Chest 2 View  Result Date: 08/21/2018 CLINICAL DATA:  Flu-like symptoms since December 10th 2019 EXAM: CHEST - 2 VIEW COMPARISON:  July 30, 2018 FINDINGS: The heart size and mediastinal contours are stable. There is no focal infiltrate, pulmonary edema, or pleural effusion. The visualized skeletal structures are unremarkable. IMPRESSION: No active cardiopulmonary disease. Electronically Signed   By: Sherian ReinWei-Chen  Lin M.D.   On: 08/21/2018 18:44    Procedures Procedures (including critical care time)  Medications Ordered in ED Medications - No data to display   Initial Impression / Assessment and Plan / ED Course  I have  reviewed the triage vital signs and the nursing notes.  Pertinent labs & imaging results that were available during my care of the patient were reviewed by me and considered in my medical decision making (see chart for details).    Pt with symptoms consistent with viral URI that now has evolved into bronchitis  Well appearing here.  No signs of breathing difficulty  No signs of pharyngitis, otitis or abnormal abdominal findings.   CXR wnl and pt to return with any further problems.  Will treat with albuterol and prednisone.  Low suspicion for ACS,  CHF and heart rate within normal limits low suspicion for dysrhythmia.   Final Clinical Impressions(s) / ED Diagnoses   Final diagnoses:  Acute bronchitis, unspecified organism    ED Discharge Orders         Ordered    predniSONE (DELTASONE) 20 MG tablet  Daily     08/21/18 1902    benzonatate (TESSALON) 100 MG capsule  Every 8 hours     08/21/18 Therisa Doyne, MD 08/21/18 1902

## 2018-08-21 NOTE — ED Triage Notes (Signed)
C/o flu like sx-seen here for same 12/10-states feels no better-cough is worse at night-NAD-steady gait

## 2018-08-23 ENCOUNTER — Ambulatory Visit (INDEPENDENT_AMBULATORY_CARE_PROVIDER_SITE_OTHER): Payer: Medicare Other | Admitting: Internal Medicine

## 2018-08-23 ENCOUNTER — Encounter: Payer: Self-pay | Admitting: Internal Medicine

## 2018-08-23 VITALS — BP 126/80 | HR 91 | Temp 98.6°F | Ht 70.0 in | Wt 233.4 lb

## 2018-08-23 DIAGNOSIS — K219 Gastro-esophageal reflux disease without esophagitis: Secondary | ICD-10-CM

## 2018-08-23 DIAGNOSIS — E6609 Other obesity due to excess calories: Secondary | ICD-10-CM | POA: Diagnosis not present

## 2018-08-23 DIAGNOSIS — Z09 Encounter for follow-up examination after completed treatment for conditions other than malignant neoplasm: Secondary | ICD-10-CM | POA: Diagnosis not present

## 2018-08-23 DIAGNOSIS — R059 Cough, unspecified: Secondary | ICD-10-CM

## 2018-08-23 DIAGNOSIS — Z6833 Body mass index (BMI) 33.0-33.9, adult: Secondary | ICD-10-CM

## 2018-08-23 DIAGNOSIS — R05 Cough: Secondary | ICD-10-CM

## 2018-08-23 MED ORDER — HYDROCODONE-HOMATROPINE 5-1.5 MG/5ML PO SYRP: 5.0000 mL | ORAL_SOLUTION | Freq: Four times a day (QID) | ORAL | 0 refills | Status: DC | PRN

## 2018-08-23 MED ORDER — OMEPRAZOLE 40 MG PO CPDR: 40.0000 mg | DELAYED_RELEASE_CAPSULE | Freq: Every day | ORAL | 1 refills | Status: DC

## 2018-08-23 NOTE — Patient Instructions (Signed)

## 2018-09-08 ENCOUNTER — Encounter: Payer: Self-pay | Admitting: Internal Medicine

## 2018-09-08 NOTE — Progress Notes (Signed)
Subjective:     Patient ID: Richard Sampson , male    DOB: [birth year] , 73 y.o.   MRN: [id number]   Chief Complaint  Patient presents with  . ER follow-up    HPI  He is here today for ER f/u.  He states he has had a cough since last month. He initially went to ER on 12/10 and was diagnosed with viral URI. His sx persisted so he went back to Er on 1/1 and was diagnosed with bronchitis. He is still coughing a lot at night.     Past Medical History:  Diagnosis Date  . A-fib (HCC) 07/17/2014  . High cholesterol   . Hypertension   . Pneumonia      Family History  Problem Relation Age of Onset  . Heart failure Father   . Cancer Father   . Kidney disease Son   . Heart attack Neg Hx   . Stroke Neg Hx      Current Outpatient Medications:  .  ammonium lactate (LAC-HYDRIN) 12 % lotion, Apply 1 application topically 2 (two) times daily., Disp: , Rfl:  .  apixaban (ELIQUIS) 5 MG TABS tablet, Take 1 tablet by mouth 2 times a day. Need to schedule appt with cardiologist for refills., Disp: 60 tablet, Rfl: 0 .  aspirin EC 81 MG tablet, Take 1 tablet (81 mg total) by mouth daily., Disp: 90 tablet, Rfl: 3 .  benzonatate (TESSALON) 100 MG capsule, Take 1 capsule (100 mg total) by mouth every 8 (eight) hours., Disp: 21 capsule, Rfl: 0 .  diclofenac sodium (VOLTAREN) 1 % GEL, Apply 2 g topically 3 (three) times daily as needed., Disp: , Rfl:  .  diltiazem (TIAZAC) 180 MG 24 hr capsule, Take 180 mg by mouth 2 (two) times daily., Disp: , Rfl: 0 .  DULoxetine (CYMBALTA) 60 MG capsule, Take 60 mg by mouth daily., Disp: , Rfl: 3 .  potassium chloride SA (K-DUR,KLOR-CON) 20 MEQ tablet, Take 20 mEq by mouth 2 (two) times daily. Morning and evening, Disp: , Rfl:  .  predniSONE (DELTASONE) 20 MG tablet, Take 2 tablets (40 mg total) by mouth daily., Disp: 10 tablet, Rfl: 0 .  triamterene-hydrochlorothiazide (MAXZIDE) 75-50 MG per tablet, Take 0.5 tablets by mouth every evening. , Disp: , Rfl:  .   HYDROcodone-homatropine (HYDROMET) 5-1.5 MG/5ML syrup, Take 5 mLs by mouth every 6 (six) hours as needed for cough., Disp: 120 mL, Rfl: 0 .  omeprazole (PRILOSEC) 40 MG capsule, Take 1 capsule (40 mg total) by mouth daily., Disp: 30 capsule, Rfl: 1   Allergies  Allergen Reactions  . Ace Inhibitors Swelling    Throat swelling; pt was on lisinopril  . Angiotensin Receptor Blockers Swelling    Cross reactivity in known ACE I allergy  . Lisinopril Swelling    Throat swelling  . Losartan Other (See Comments)    Doctor told pt not to take     Review of Systems  Constitutional: Negative.   Respiratory: Positive for cough.   Cardiovascular: Negative.   Gastrointestinal: Negative.        He c/o heartburn. Has noticed that he is belching more. Unable to determine what triggers his sx.   Genitourinary: Negative.   Neurological: Negative.   Psychiatric/Behavioral: Negative.      Today's Vitals   08/23/18 1533  BP: 126/80  Pulse: 91  Temp: 98.6 F (37 C)  TempSrc: Oral  SpO2: 96%  Weight: 233 lb 6.4 oz (105.9 kg)  Height: 5\' 10"  (1.778 m)  PainSc: 0-No pain   Body mass index is 33.49 kg/m.   Objective:  Physical Exam Vitals signs and nursing note reviewed.  Constitutional:      Appearance: Normal appearance. He is obese.  HENT:     Head: Normocephalic and atraumatic.     Right Ear: Tympanic membrane, ear canal and external ear normal.     Left Ear: Tympanic membrane, ear canal and external ear normal.     Mouth/Throat:     Mouth: Mucous membranes are moist.     Pharynx: Posterior oropharyngeal erythema present.  Cardiovascular:     Rate and Rhythm: Normal rate and regular rhythm.     Heart sounds: Normal heart sounds.  Pulmonary:     Effort: Pulmonary effort is normal.     Breath sounds: Normal breath sounds.  Skin:    General: Skin is warm.  Neurological:     Mental Status: He is alert.  Psychiatric:        Mood and Affect: Mood normal.         Assessment And  Plan:     1. Cough   Persistent. ER records from 12/10 and 1/1 were reviewed in full detail. He was given rx hydromet syrup to take prn. He will call me next week to let me know if his sx persist.    2. Gastroesophageal reflux disease without esophagitis  He was given rx omeprazole 40mg  to take once daily. He was encouraged to stop eating 3 hours prior to going to bed. He will rto in 4-6 weeks for re-evaluation.   3. Class 1 obesity due to excess calories with serious comorbidity and body mass index (BMI) of 33.0 to 33.9 in adult  He is encouraged to strive for BMI less than 29 to decrease cardiac risk. He is encouraged to start a regular exercise program once he has fully recovered from recent URI visit.   Gwynneth Aliment, MD

## 2018-09-26 ENCOUNTER — Other Ambulatory Visit: Payer: Self-pay

## 2018-09-26 ENCOUNTER — Encounter: Payer: Self-pay | Admitting: Internal Medicine

## 2018-09-26 ENCOUNTER — Ambulatory Visit (INDEPENDENT_AMBULATORY_CARE_PROVIDER_SITE_OTHER): Payer: Medicare Other | Admitting: Internal Medicine

## 2018-09-26 VITALS — BP 130/74 | HR 87 | Temp 97.7°F | Ht 69.4 in | Wt 230.4 lb

## 2018-09-26 DIAGNOSIS — I480 Paroxysmal atrial fibrillation: Secondary | ICD-10-CM

## 2018-09-26 DIAGNOSIS — R7309 Other abnormal glucose: Secondary | ICD-10-CM

## 2018-09-26 DIAGNOSIS — K219 Gastro-esophageal reflux disease without esophagitis: Secondary | ICD-10-CM | POA: Diagnosis not present

## 2018-09-26 DIAGNOSIS — I77810 Thoracic aortic ectasia: Secondary | ICD-10-CM

## 2018-09-26 DIAGNOSIS — Z6833 Body mass index (BMI) 33.0-33.9, adult: Secondary | ICD-10-CM

## 2018-09-26 DIAGNOSIS — I119 Hypertensive heart disease without heart failure: Secondary | ICD-10-CM

## 2018-09-26 DIAGNOSIS — E6609 Other obesity due to excess calories: Secondary | ICD-10-CM

## 2018-09-26 NOTE — Patient Instructions (Signed)

## 2018-09-26 NOTE — Progress Notes (Signed)
Subjective:     Patient ID: Richard Sampson , male    DOB: 08/19/1946 , 73 y.o.   MRN: [id number]   Chief Complaint  Patient presents with  . Hypertension    HPI  Hypertension  This is a chronic problem. The current episode started more than 1 year ago. The problem has been gradually improving since onset. The problem is controlled. Pertinent negatives include no blurred vision, chest pain, palpitations or shortness of breath.   He reports compliance with medications.   Past Medical History:  Diagnosis Date  . A-fib ([place]) 07/17/2014  . High cholesterol   . Hypertension   . Pneumonia      Family History  Problem Relation Age of Onset  . Heart failure Father   . Cancer Father   . Kidney disease Son   . Heart attack Neg Hx   . Stroke Neg Hx      Current Outpatient Medications:  .  ammonium lactate (LAC-HYDRIN) 12 % lotion, Apply 1 application topically 2 (two) times daily., Disp: , Rfl:  .  apixaban (ELIQUIS) 5 MG TABS tablet, Take 1 tablet by mouth 2 times a day. Need to schedule appt with cardiologist for refills., Disp: 60 tablet, Rfl: 0 .  aspirin EC 81 MG tablet, Take 1 tablet (81 mg total) by mouth daily., Disp: 90 tablet, Rfl: 3 .  diclofenac sodium (VOLTAREN) 1 % GEL, Apply 2 g topically 3 (three) times daily as needed., Disp: , Rfl:  .  diltiazem (TIAZAC) 180 MG 24 hr capsule, Take 180 mg by mouth 2 (two) times daily., Disp: , Rfl: 0 .  DULoxetine (CYMBALTA) 60 MG capsule, Take 60 mg by mouth daily., Disp: , Rfl: 3 .  omeprazole (PRILOSEC) 40 MG capsule, Take 1 capsule (40 mg total) by mouth daily., Disp: 30 capsule, Rfl: 1 .  potassium chloride SA (K-DUR,KLOR-CON) 20 MEQ tablet, Take 20 mEq by mouth 2 (two) times daily. Morning and evening, Disp: , Rfl:  .  predniSONE (DELTASONE) 20 MG tablet, Take 2 tablets (40 mg total) by mouth daily., Disp: 10 tablet, Rfl: 0 .  triamterene-hydrochlorothiazide (MAXZIDE) 75-50 MG per tablet, Take 0.5 tablets by mouth every  evening. , Disp: , Rfl:  .  benzonatate (TESSALON) 100 MG capsule, Take 1 capsule (100 mg total) by mouth every 8 (eight) hours. (Patient not taking: Reported on 09/26/2018), Disp: 21 capsule, Rfl: 0 .  HYDROcodone-homatropine (HYDROMET) 5-1.5 MG/5ML syrup, Take 5 mLs by mouth every 6 (six) hours as needed for cough. (Patient not taking: Reported on 09/26/2018), Disp: 120 mL, Rfl: 0   Allergies  Allergen Reactions  . Ace Inhibitors Swelling    Throat swelling; pt was on lisinopril  . Angiotensin Receptor Blockers Swelling    Cross reactivity in known ACE I allergy  . Lisinopril Swelling    Throat swelling  . Losartan Other (See Comments)    Doctor told pt not to take     Review of Systems  Constitutional: Negative.   Eyes: Negative for blurred vision.  Respiratory: Negative.  Negative for shortness of breath.   Cardiovascular: Negative.  Negative for chest pain and palpitations.  Gastrointestinal:       He states his heartburn improved with use of omeprazole. He has yet to pick up his refill. He admits he eats a lot of spicy foods, and foods with tomatoes.   Neurological: Negative.   Psychiatric/Behavioral: Negative.      Today's Vitals   09/26/18 0856  BP: 130/74  Pulse: 87  Temp: 97.7 F (36.5 C)  TempSrc: Oral  SpO2: 96%  Weight: 230 lb 6.4 oz (104.5 kg)  Height: 5' 9.4" (1.763 m)   Body mass index is 33.63 kg/m.   Objective:  Physical Exam Vitals signs and nursing note reviewed.  Constitutional:      Appearance: Normal appearance.  HENT:     Head: Normocephalic and atraumatic.  Cardiovascular:     Rate and Rhythm: Normal rate. Rhythm irregular.     Heart sounds: Normal heart sounds.  Pulmonary:     Effort: Pulmonary effort is normal.     Breath sounds: Normal breath sounds.  Skin:    General: Skin is warm.  Neurological:     Mental Status: He is alert.  Psychiatric:        Mood and Affect: Mood normal.         Assessment And Plan:     1. Hypertensive  heart disease without heart failure  Controlled. He will continue with current meds. He is encouraged to avoid adding salt to his foods. He will rto in six months for his next physical examination.   - Lipid panel - CMP14+EGFR  2. Ascending aorta dilatation (HCC)  Chronic, yet stable.   3. Paroxysmal atrial fibrillation (HCC)  Rate controlled.  He will continue with current meds.   4. Gastroesophageal reflux disease without esophagitis  He is encouraged to fill next rx for omeprazole. After this, he can stop the medication. He is encouraged to cut back on his intake of spicy foods and acidic foods, like tomato-based dishes. He is also reminded to stop eating three hours prior to going to bed.   5. Other abnormal glucose  HIS A1C HAS BEEN ELEVATED IN THE PAST. I WILL CHECK AN A1C, BMET TODAY. HE IS ENCOURAGED TO AVOID SUGARY BEVERAGES AND PROCESSED FOODS INCLUDNG BREADS, RICE AND PASTA.  - Hemoglobin A1c  6. Class 1 obesity due to excess calories with serious comorbidity and body mass index (BMI) of 33.0 to 33.9 in adult  He is encouraged to initially strive for BMI less than 30 to decrease cardiac risk. He is advised to exercise no less than 150 minutes per week.    [name], MD

## 2018-09-27 LAB — CMP14+EGFR
ALT: 11 IU/L (ref 0–44)
AST: 14 IU/L (ref 0–40)
Albumin/Globulin Ratio: 1.4 (ref 1.2–2.2)
Albumin: 4.1 g/dL (ref 3.8–4.8)
Alkaline Phosphatase: 87 IU/L (ref 39–117)
BUN/Creatinine Ratio: 9 — ABNORMAL LOW (ref 10–24)
BUN: 12 mg/dL (ref 8–27)
Bilirubin Total: 0.3 mg/dL (ref 0.0–1.2)
CO2: 23 mmol/L (ref 20–29)
Calcium: 9.5 mg/dL (ref 8.6–10.2)
Chloride: 103 mmol/L (ref 96–106)
Creatinine, Ser: 1.34 mg/dL — ABNORMAL HIGH (ref 0.76–1.27)
GFR calc Af Amer: 62 mL/min/{1.73_m2} (ref >59)
GFR calc non Af Amer: 53 mL/min/{1.73_m2} — ABNORMAL LOW (ref >59)
Globulin, Total: 2.9 g/dL (ref 1.5–4.5)
Glucose: 93 mg/dL (ref 65–99)
Potassium: 4.2 mmol/L (ref 3.5–5.2)
Sodium: 141 mmol/L (ref 134–144)
Total Protein: 7 g/dL (ref 6.0–8.5)

## 2018-09-27 LAB — LIPID PANEL
Chol/HDL Ratio: 3.4 ratio (ref 0.0–5.0)
Cholesterol, Total: 151 mg/dL (ref 100–199)
HDL: 45 mg/dL (ref >39)
LDL Calculated: 85 mg/dL (ref 0–99)
Triglycerides: 106 mg/dL (ref 0–149)
VLDL Cholesterol Cal: 21 mg/dL (ref 5–40)

## 2018-09-27 LAB — HEMOGLOBIN A1C
Est. average glucose Bld gHb Est-mCnc: 117 mg/dL
Hgb A1c MFr Bld: 5.7 % — ABNORMAL HIGH (ref 4.8–5.6)

## 2018-10-09 ENCOUNTER — Emergency Department (HOSPITAL_BASED_OUTPATIENT_CLINIC_OR_DEPARTMENT_OTHER): Payer: Medicare Other

## 2018-10-09 ENCOUNTER — Emergency Department (HOSPITAL_BASED_OUTPATIENT_CLINIC_OR_DEPARTMENT_OTHER)
Admission: EM | Admit: 2018-10-09 | Discharge: 2018-10-09 | Disposition: A | Discharge status: Home / Self Care | Payer: Medicare Other | Attending: Emergency Medicine | Admitting: Emergency Medicine

## 2018-10-09 ENCOUNTER — Encounter (HOSPITAL_BASED_OUTPATIENT_CLINIC_OR_DEPARTMENT_OTHER): Payer: Self-pay | Admitting: Emergency Medicine

## 2018-10-09 ENCOUNTER — Other Ambulatory Visit: Payer: Self-pay

## 2018-10-09 DIAGNOSIS — R0789 Other chest pain: Secondary | ICD-10-CM | POA: Insufficient documentation

## 2018-10-09 DIAGNOSIS — Z7982 Long term (current) use of aspirin: Secondary | ICD-10-CM | POA: Insufficient documentation

## 2018-10-09 DIAGNOSIS — R079 Chest pain, unspecified: Secondary | ICD-10-CM | POA: Diagnosis present

## 2018-10-09 DIAGNOSIS — I10 Essential (primary) hypertension: Secondary | ICD-10-CM | POA: Diagnosis not present

## 2018-10-09 DIAGNOSIS — Z87891 Personal history of nicotine dependence: Secondary | ICD-10-CM | POA: Diagnosis not present

## 2018-10-09 DIAGNOSIS — Z79899 Other long term (current) drug therapy: Secondary | ICD-10-CM | POA: Insufficient documentation

## 2018-10-09 DIAGNOSIS — Z7901 Long term (current) use of anticoagulants: Secondary | ICD-10-CM | POA: Insufficient documentation

## 2018-10-09 LAB — COMPREHENSIVE METABOLIC PANEL
ALT: 13 U/L (ref 0–44)
AST: 16 U/L (ref 15–41)
Albumin: 3.9 g/dL (ref 3.5–5.0)
Alkaline Phosphatase: 80 U/L (ref 38–126)
Anion gap: 7 (ref 5–15)
BUN: 17 mg/dL (ref 8–23)
CO2: 27 mmol/L (ref 22–32)
Calcium: 9.1 mg/dL (ref 8.9–10.3)
Chloride: 103 mmol/L (ref 98–111)
Creatinine, Ser: 1.33 mg/dL — ABNORMAL HIGH (ref 0.61–1.24)
GFR calc Af Amer: >60 mL/min (ref >60)
GFR calc non Af Amer: 54 mL/min — ABNORMAL LOW (ref >60)
Glucose, Bld: 102 mg/dL — ABNORMAL HIGH (ref 70–99)
Potassium: 3.1 mmol/L — ABNORMAL LOW (ref 3.5–5.1)
Sodium: 137 mmol/L (ref 135–145)
Total Bilirubin: 0.5 mg/dL (ref 0.3–1.2)
Total Protein: 7.3 g/dL (ref 6.5–8.1)

## 2018-10-09 LAB — TROPONIN I
Troponin I: <0.03 ng/mL (ref <0.03)
Troponin I: <0.03 ng/mL (ref <0.03)

## 2018-10-09 LAB — CBC WITH DIFFERENTIAL/PLATELET
Abs Immature Granulocytes: 0.01 10*3/uL (ref 0.00–0.07)
Basophils Absolute: 0 10*3/uL (ref 0.0–0.1)
Basophils Relative: 1 %
Eosinophils Absolute: 0.1 10*3/uL (ref 0.0–0.5)
Eosinophils Relative: 3 %
HCT: 35.6 % — ABNORMAL LOW (ref 39.0–52.0)
Hemoglobin: 10.9 g/dL — ABNORMAL LOW (ref 13.0–17.0)
Immature Granulocytes: 0 %
Lymphocytes Relative: 34 %
Lymphs Abs: 1.7 10*3/uL (ref 0.7–4.0)
MCH: 27.7 pg (ref 26.0–34.0)
MCHC: 30.6 g/dL (ref 30.0–36.0)
MCV: 90.4 fL (ref 80.0–100.0)
Monocytes Absolute: 0.7 10*3/uL (ref 0.1–1.0)
Monocytes Relative: 13 %
Neutro Abs: 2.6 10*3/uL (ref 1.7–7.7)
Neutrophils Relative %: 49 %
Platelets: 265 10*3/uL (ref 150–400)
RBC: 3.94 MIL/uL — ABNORMAL LOW (ref 4.22–5.81)
RDW: 13.6 % (ref 11.5–15.5)
WBC: 5.2 10*3/uL (ref 4.0–10.5)
nRBC: 0 % (ref 0.0–0.2)

## 2018-10-09 LAB — LIPASE, BLOOD: Lipase: 46 U/L (ref 11–51)

## 2018-10-09 MED ORDER — ASPIRIN 81 MG PO CHEW
324.0000 mg | CHEWABLE_TABLET | Freq: Once | ORAL | Status: AC
  Administered 2018-10-09: 324 mg via ORAL
  Filled 2018-10-09: qty 4

## 2018-10-09 MED ORDER — PANTOPRAZOLE SODIUM 40 MG PO TBEC
40.0000 mg | DELAYED_RELEASE_TABLET | Freq: Once | ORAL | Status: AC
  Administered 2018-10-09: 40 mg via ORAL
  Filled 2018-10-09: qty 1

## 2018-10-09 MED ORDER — LIDOCAINE VISCOUS HCL 2 % MT SOLN
15.0000 mL | Freq: Once | OROMUCOSAL | Status: AC
  Administered 2018-10-09: 15 mL via ORAL
  Filled 2018-10-09: qty 15

## 2018-10-09 MED ORDER — ALUM & MAG HYDROXIDE-SIMETH 200-200-20 MG/5ML PO SUSP
30.0000 mL | Freq: Once | ORAL | Status: AC
  Administered 2018-10-09: 30 mL via ORAL
  Filled 2018-10-09: qty 30

## 2018-10-09 NOTE — ED Provider Notes (Signed)
[facility] Provider Note: Richard Dell, MD, FACEP  CSN: [id number] MRN: [id number] ARRIVAL: 10/09/18 at 0103 ROOM: [facility]   CHIEF COMPLAINT  Chest Pain   HISTORY OF PRESENT ILLNESS  10/09/18 1:14 AM Richard Sampson is a 73 y.o. male who developed chest discomfort about 45 minutes prior to arrival.  He describes the discomfort as feeling like gas and he rates it as a 4 out of 10.  He has subsequently developed a similar sensation in his epigastrium.  He denies associated nausea, vomiting, shortness of breath or diaphoresis.  Nothing makes the pain better or worse.  He is not on aspirin.   Past Medical History:  Diagnosis Date  . A-fib (HCC) 07/17/2014  . High cholesterol   . Hypertension   . Pneumonia     Past Surgical History:  Procedure Laterality Date  . TONSILLECTOMY      Family History  Problem Relation Age of Onset  . Heart failure Father   . Cancer Father   . Kidney disease Son   . Heart attack Neg Hx   . Stroke Neg Hx     Social History   Tobacco Use  . Smoking status: Former Smoker    Years: 20.00    Types: Cigars    Last attempt to quit: 08/18/2018    Years since quitting: 0.1  . Smokeless tobacco: Never Used  Substance Use Topics  . Alcohol use: Yes    Comment: occ  . Drug use: Never    Prior to Admission medications   Medication Sig Start Date End Date Taking? Authorizing Provider  ammonium lactate (LAC-HYDRIN) 12 % lotion Apply 1 application topically 2 (two) times daily.    [provider]  apixaban (ELIQUIS) 5 MG TABS tablet Take 1 tablet by mouth 2 times a day. Need to schedule appt with cardiologist for refills. 12/28/16   Duke Salvia, MD  aspirin EC 81 MG tablet Take 1 tablet (81 mg total) by mouth daily. 10/20/15   Abelino Derrick, PA-C  benzonatate (TESSALON) 100 MG capsule Take 1 capsule (100 mg total) by mouth every 8 (eight) hours. Patient not taking: Reported on 09/26/2018 08/21/18   Gwyneth Sprout, MD    diclofenac sodium (VOLTAREN) 1 % GEL Apply 2 g topically 3 (three) times daily as needed.    [provider]  diltiazem (TIAZAC) 180 MG 24 hr capsule Take 180 mg by mouth 2 (two) times daily. 08/23/15   [provider]  DULoxetine (CYMBALTA) 60 MG capsule Take 60 mg by mouth daily. 09/20/15   [provider]  HYDROcodone-homatropine (HYDROMET) 5-1.5 MG/5ML syrup Take 5 mLs by mouth every 6 (six) hours as needed for cough. Patient not taking: Reported on 09/26/2018 08/23/18   [name], MD  omeprazole (PRILOSEC) 40 MG capsule Take 1 capsule (40 mg total) by mouth daily. 08/23/18 08/23/19  [name], MD  potassium chloride SA (K-DUR,KLOR-CON) 20 MEQ tablet Take 20 mEq by mouth 2 (two) times daily. Morning and evening    [provider]  predniSONE (DELTASONE) 20 MG tablet Take 2 tablets (40 mg total) by mouth daily. 08/21/18   Gwyneth Sprout, MD  triamterene-hydrochlorothiazide (MAXZIDE) 75-50 MG per tablet Take 0.5 tablets by mouth every evening.     [provider]    Allergies Ace inhibitors; Angiotensin receptor blockers; Lisinopril; and Losartan   REVIEW OF SYSTEMS  Negative except as noted here or in the History of Present Illness.   PHYSICAL  EXAMINATION  Initial Vital Signs Blood pressure 126/87, pulse 88, temperature 98.2 F (36.8 C), temperature source Oral, resp. rate 20, height 5\' 9"  (1.753 m), weight 104.3 kg, SpO2 97 %.  Examination General: Well-developed, well-nourished male in no acute distress; appearance consistent with age of record HENT: normocephalic; atraumatic Eyes: pupils equal, round and reactive to light; extraocular muscles intact Neck: supple Heart: regular rate and rhythm Lungs: clear to auscultation bilaterally Abdomen: soft; nondistended; nontender; bowel sounds present Extremities: No deformity; full range of motion; pulses normal Neurologic: Awake, alert and oriented; motor function intact in all  extremities and symmetric; no facial droop Skin: Warm and dry Psychiatric: Normal mood and affect   RESULTS  Summary of this visit's results, reviewed by myself:   EKG Interpretation  Date/Time:  Wednesday October 09 2018 01:09:28 EST Ventricular Rate:  86 PR Interval:    QRS Duration: 104 QT Interval:  396 QTC Calculation: 474 R Axis:   -89 Text Interpretation:  Sinus rhythm Atrial premature complex Left atrial enlargement LAD, consider left anterior fascicular block Abnormal R-wave progression, late transition Left ventricular hypertrophy Baseline wander in lead(s) V4 No significant change was found Confirmed by Meline Russaw, Jonny RuizJohn ([id number]) on 10/09/2018 1:13:58 AM      Laboratory Studies: Results for orders placed or performed during the hospital encounter of 10/09/18 (from the past 24 hour(s))  Lipase, blood     Status: None   Collection Time: 10/09/18  1:17 AM  Result Value Ref Range   Lipase 46 11 - 51 U/L  Comprehensive metabolic panel     Status: Abnormal   Collection Time: 10/09/18  1:17 AM  Result Value Ref Range   Sodium 137 135 - 145 mmol/L   Potassium 3.1 (L) 3.5 - 5.1 mmol/L   Chloride 103 98 - 111 mmol/L   CO2 27 22 - 32 mmol/L   Glucose, Bld 102 (H) 70 - 99 mg/dL   BUN 17 8 - 23 mg/dL   Creatinine, Ser 1.911.33 (H) 0.61 - 1.24 mg/dL   Calcium 9.1 8.9 - 47.810.3 mg/dL   Total Protein 7.3 6.5 - 8.1 g/dL   Albumin 3.9 3.5 - 5.0 g/dL   AST 16 15 - 41 U/L   ALT 13 0 - 44 U/L   Alkaline Phosphatase 80 38 - 126 U/L   Total Bilirubin 0.5 0.3 - 1.2 mg/dL   GFR calc non Af Amer 54 (L) >60 mL/min   GFR calc Af Amer >60 >60 mL/min   Anion gap 7 5 - 15  CBC with Differential/Platelet     Status: Abnormal   Collection Time: 10/09/18  1:17 AM  Result Value Ref Range   WBC 5.2 4.0 - 10.5 K/uL   RBC 3.94 (L) 4.22 - 5.81 MIL/uL   Hemoglobin 10.9 (L) 13.0 - 17.0 g/dL   HCT 29.535.6 (L) 62.139.0 - 30.852.0 %   MCV 90.4 80.0 - 100.0 fL   MCH 27.7 26.0 - 34.0 pg   MCHC 30.6 30.0 - 36.0 g/dL    RDW 65.713.6 84.611.5 - 96.215.5 %   Platelets 265 150 - 400 K/uL   nRBC 0.0 0.0 - 0.2 %   Neutrophils Relative % 49 %   Neutro Abs 2.6 1.7 - 7.7 K/uL   Lymphocytes Relative 34 %   Lymphs Abs 1.7 0.7 - 4.0 K/uL   Monocytes Relative 13 %   Monocytes Absolute 0.7 0.1 - 1.0 K/uL   Eosinophils Relative 3 %   Eosinophils Absolute 0.1 0.0 -  0.5 K/uL   Basophils Relative 1 %   Basophils Absolute 0.0 0.0 - 0.1 K/uL   Immature Granulocytes 0 %   Abs Immature Granulocytes 0.01 0.00 - 0.07 K/uL  Troponin I - ONCE - STAT     Status: None   Collection Time: 10/09/18  1:17 AM  Result Value Ref Range   Troponin I <0.03 <0.03 ng/mL  Troponin I - ONCE - STAT     Status: None   Collection Time: 10/09/18  4:17 AM  Result Value Ref Range   Troponin I <0.03 <0.03 ng/mL   Imaging Studies: Dg Chest 2 View  Result Date: 10/09/2018 CLINICAL DATA:  Chest pain EXAM: CHEST - 2 VIEW COMPARISON:  08/21/2018 FINDINGS: The heart size and mediastinal contours are within normal limits. Both lungs are clear. The visualized skeletal structures are unremarkable. IMPRESSION: No active cardiopulmonary disease. Electronically Signed   By: Deatra Robinson M.D.   On: 10/09/2018 01:45    ED COURSE and MDM  Nursing notes and initial vitals signs, including pulse oximetry, reviewed.  Vitals:   10/09/18 0108 10/09/18 0114  BP:  126/87  Pulse:  88  Resp:  20  Temp:  98.2 F (36.8 C)  TempSrc:  Oral  SpO2:  97%  Weight: 104.3 kg   Height: 5\' 9"  (1.753 m)    1:51 AM Asymptomatic after GI cocktail.  2:41 AM The patient continues to be asymptomatic.  4:54 AM The patient continues to be asymptomatic.  His troponin is been negative x2.  His EKG is not significantly changed from prior.  His symptomatology is atypical for acute coronary syndrome and are consistent with GI etiology.  I do not believe admission is indicated at this time.  He was advised to return for worsening symptoms.  PROCEDURES    ED DIAGNOSES     ICD-10-CM    1. Atypical chest pain R07.89        Paula Libra, MD 10/09/18 [phone]

## 2018-10-09 NOTE — ED Notes (Signed)
Patient transported to X-ray 

## 2018-10-09 NOTE — ED Triage Notes (Signed)
Mid chest pain, 45 minutes PTA. No other sx. No sob, pt states feels like "gas"

## 2018-10-09 NOTE — ED Notes (Signed)
PT states understanding of care given, follow up care. PT ambulated from ED to car with a steady gait.  

## 2018-10-10 ENCOUNTER — Encounter: Payer: Self-pay | Admitting: Internal Medicine

## 2018-10-10 ENCOUNTER — Ambulatory Visit (INDEPENDENT_AMBULATORY_CARE_PROVIDER_SITE_OTHER): Payer: Medicare Other | Admitting: Internal Medicine

## 2018-10-10 VITALS — BP 114/86 | HR 85 | Temp 97.8°F | Ht 69.0 in | Wt 230.6 lb

## 2018-10-10 DIAGNOSIS — R0789 Other chest pain: Secondary | ICD-10-CM | POA: Diagnosis not present

## 2018-10-10 DIAGNOSIS — Z712 Person consulting for explanation of examination or test findings: Secondary | ICD-10-CM | POA: Diagnosis not present

## 2018-10-10 DIAGNOSIS — Z09 Encounter for follow-up examination after completed treatment for conditions other than malignant neoplasm: Secondary | ICD-10-CM

## 2018-10-10 DIAGNOSIS — K219 Gastro-esophageal reflux disease without esophagitis: Secondary | ICD-10-CM

## 2018-10-10 NOTE — Patient Instructions (Signed)

## 2018-10-20 ENCOUNTER — Encounter: Payer: Self-pay | Admitting: Internal Medicine

## 2018-10-20 NOTE — Progress Notes (Signed)
Subjective:     Patient ID: Richard Sampson , male    DOB: Dec 16, 1945 , 73 y.o.   MRN: [id number]   Chief Complaint  Patient presents with  . Hospitalization Follow-up    HPI  He is here today for ER f/u. He presented to [facility] early am 2/19 for further evaluation of chest pain. He reports that he developed chest pains in the middle of the night. He thought it could have been gas, but when his sx persisted, he went to ER for further evaluation. Cardiac workup was negative. He does not have any cp at this time.     Past Medical History:  Diagnosis Date  . A-fib (HCC) 07/17/2014  . High cholesterol   . Hypertension   . Pneumonia      Family History  Problem Relation Age of Onset  . Thyroid nodules Mother   . Dementia Mother   . Heart failure Father   . Cancer Father   . Kidney disease Son   . Heart attack Neg Hx   . Stroke Neg Hx      Current Outpatient Medications:  .  ammonium lactate (LAC-HYDRIN) 12 % lotion, Apply 1 application topically 2 (two) times daily., Disp: , Rfl:  .  apixaban (ELIQUIS) 5 MG TABS tablet, Take 1 tablet by mouth 2 times a day. Need to schedule appt with cardiologist for refills., Disp: 60 tablet, Rfl: 0 .  diclofenac sodium (VOLTAREN) 1 % GEL, Apply 2 g topically 3 (three) times daily as needed., Disp: , Rfl:  .  diltiazem (TIAZAC) 180 MG 24 hr capsule, Take 180 mg by mouth 2 (two) times daily., Disp: , Rfl: 0 .  omeprazole (PRILOSEC) 40 MG capsule, Take 1 capsule (40 mg total) by mouth daily., Disp: 30 capsule, Rfl: 1 .  potassium chloride SA (K-DUR,KLOR-CON) 20 MEQ tablet, Take 40 mEq by mouth 2 (two) times daily. Morning and evening, Disp: , Rfl:  .  triamterene-hydrochlorothiazide (MAXZIDE) 75-50 MG per tablet, Take 0.5 tablets by mouth every evening. , Disp: , Rfl:  .  aspirin EC 81 MG tablet, Take 1 tablet (81 mg total) by mouth daily. (Patient not taking: Reported on 10/10/2018), Disp: 90 tablet, Rfl: 3 .  DULoxetine (CYMBALTA) 60 MG  capsule, Take 60 mg by mouth daily., Disp: , Rfl: 3   Allergies  Allergen Reactions  . Ace Inhibitors Swelling    Throat swelling; pt was on lisinopril  . Angiotensin Receptor Blockers Swelling    Cross reactivity in known ACE I allergy  . Lisinopril Swelling    Throat swelling  . Losartan Other (See Comments)    Doctor told pt not to take     Review of Systems  Constitutional: Negative.   Cardiovascular: Positive for chest pain.  Gastrointestinal: Negative.   Neurological: Negative.   Psychiatric/Behavioral: Negative.      Today's Vitals   10/10/18 0849  BP: 114/86  Pulse: 85  Temp: 97.8 F (36.6 C)  TempSrc: Oral  Weight: 230 lb 9.6 oz (104.6 kg)  Height: 5\' 9"  (1.753 m)  PainSc: 0-No pain   Body mass index is 34.05 kg/m.   Objective:  Physical Exam Vitals signs and nursing note reviewed.  Constitutional:      Appearance: Normal appearance.  Cardiovascular:     Rate and Rhythm: Normal rate and regular rhythm.     Heart sounds: Normal heart sounds.  Pulmonary:     Effort: Pulmonary effort is normal.  Breath sounds: Normal breath sounds.  Skin:    General: Skin is warm.  Neurological:     General: No focal deficit present.     Mental Status: He is alert.  Psychiatric:        Mood and Affect: Mood normal.         Assessment And Plan:     1. Atypical chest pain  ER records reviewed. I doubt his symptoms are cardiac related.   2. Gastroesophageal reflux disease without esophagitis  I will increase his omeprazole 20mg  to twice daily (this dose dispensed by the [facility]). He was also given list of foods to avoid. He will rto in four to six weeks for re-evaluation.   Gwynneth Aliment, MD

## 2018-11-26 ENCOUNTER — Encounter (HOSPITAL_BASED_OUTPATIENT_CLINIC_OR_DEPARTMENT_OTHER): Payer: Self-pay | Admitting: Emergency Medicine

## 2018-11-26 ENCOUNTER — Emergency Department (HOSPITAL_BASED_OUTPATIENT_CLINIC_OR_DEPARTMENT_OTHER): Payer: Medicare Other

## 2018-11-26 ENCOUNTER — Other Ambulatory Visit: Payer: Self-pay

## 2018-11-26 ENCOUNTER — Emergency Department (HOSPITAL_BASED_OUTPATIENT_CLINIC_OR_DEPARTMENT_OTHER)
Admission: EM | Admit: 2018-11-26 | Discharge: 2018-11-26 | Disposition: A | Discharge status: Home / Self Care | Payer: Medicare Other | Attending: Emergency Medicine | Admitting: Emergency Medicine

## 2018-11-26 DIAGNOSIS — I10 Essential (primary) hypertension: Secondary | ICD-10-CM | POA: Diagnosis not present

## 2018-11-26 DIAGNOSIS — Z79899 Other long term (current) drug therapy: Secondary | ICD-10-CM | POA: Diagnosis not present

## 2018-11-26 DIAGNOSIS — F17290 Nicotine dependence, other tobacco product, uncomplicated: Secondary | ICD-10-CM | POA: Insufficient documentation

## 2018-11-26 DIAGNOSIS — R0789 Other chest pain: Secondary | ICD-10-CM | POA: Diagnosis present

## 2018-11-26 DIAGNOSIS — Z7901 Long term (current) use of anticoagulants: Secondary | ICD-10-CM | POA: Insufficient documentation

## 2018-11-26 LAB — CBC WITH DIFFERENTIAL/PLATELET
Abs Immature Granulocytes: 0 10*3/uL (ref 0.00–0.07)
Basophils Absolute: 0 10*3/uL (ref 0.0–0.1)
Basophils Relative: 1 %
Eosinophils Absolute: 0.1 10*3/uL (ref 0.0–0.5)
Eosinophils Relative: 2 %
HCT: 32.4 % — ABNORMAL LOW (ref 39.0–52.0)
Hemoglobin: 10.1 g/dL — ABNORMAL LOW (ref 13.0–17.0)
Immature Granulocytes: 0 %
Lymphocytes Relative: 35 %
Lymphs Abs: 1.3 10*3/uL (ref 0.7–4.0)
MCH: 25.9 pg — ABNORMAL LOW (ref 26.0–34.0)
MCHC: 31.2 g/dL (ref 30.0–36.0)
MCV: 83.1 fL (ref 80.0–100.0)
Monocytes Absolute: 0.5 10*3/uL (ref 0.1–1.0)
Monocytes Relative: 14 %
Neutro Abs: 1.8 10*3/uL (ref 1.7–7.7)
Neutrophils Relative %: 48 %
Platelets: 257 10*3/uL (ref 150–400)
RBC: 3.9 MIL/uL — ABNORMAL LOW (ref 4.22–5.81)
RDW: 14.7 % (ref 11.5–15.5)
WBC: 3.8 10*3/uL — ABNORMAL LOW (ref 4.0–10.5)
nRBC: 0 % (ref 0.0–0.2)

## 2018-11-26 LAB — COMPREHENSIVE METABOLIC PANEL
ALT: 13 U/L (ref 0–44)
AST: 16 U/L (ref 15–41)
Albumin: 3.7 g/dL (ref 3.5–5.0)
Alkaline Phosphatase: 71 U/L (ref 38–126)
Anion gap: 5 (ref 5–15)
BUN: 16 mg/dL (ref 8–23)
CO2: 27 mmol/L (ref 22–32)
Calcium: 9.1 mg/dL (ref 8.9–10.3)
Chloride: 104 mmol/L (ref 98–111)
Creatinine, Ser: 1.1 mg/dL (ref 0.61–1.24)
GFR calc Af Amer: >60 mL/min (ref >60)
GFR calc non Af Amer: >60 mL/min (ref >60)
Glucose, Bld: 108 mg/dL — ABNORMAL HIGH (ref 70–99)
Potassium: 3 mmol/L — ABNORMAL LOW (ref 3.5–5.1)
Sodium: 136 mmol/L (ref 135–145)
Total Bilirubin: 0.3 mg/dL (ref 0.3–1.2)
Total Protein: 7 g/dL (ref 6.5–8.1)

## 2018-11-26 LAB — TROPONIN I
Troponin I: <0.03 ng/mL (ref <0.03)
Troponin I: <0.03 ng/mL (ref <0.03)

## 2018-11-26 LAB — LIPASE, BLOOD: Lipase: 40 U/L (ref 11–51)

## 2018-11-26 MED ORDER — ALUM & MAG HYDROXIDE-SIMETH 200-200-20 MG/5ML PO SUSP
30.0000 mL | Freq: Once | ORAL | Status: AC
  Administered 2018-11-26: 30 mL via ORAL
  Filled 2018-11-26: qty 30

## 2018-11-26 MED ORDER — LIDOCAINE VISCOUS HCL 2 % MT SOLN
15.0000 mL | Freq: Once | OROMUCOSAL | Status: AC
  Administered 2018-11-26: 15 mL via ORAL
  Filled 2018-11-26: qty 15

## 2018-11-26 NOTE — ED Provider Notes (Signed)
I received this patient in signout from Dr. Elesa Massed. Briefly, pt presented with atypical chest pain that resolved with GI cocktail. Previous presentations for the same. At time of signout, 1st trop negative, awaiting 3-hour trop.   2nd trop negative and repeat EKG unchanged from prior. Pt well appearing on reassessment. Discussed workup results, PCP f/u, and return precautions. He voiced understanding.   Syd Manges, Ambrose Finland, MD 11/26/18 [phone]

## 2018-11-26 NOTE — Discharge Instructions (Addendum)
Your cardiac labs, chest x-ray, EKG were unremarkable today.  You have similar symptoms at home, you may use over-the-counter Mylanta or Maalox.  If you ever develop persistent chest pain that feels like a tightness, pressure with shortness of breath, nausea, sweating, feel lightheaded like he might pass out, I recommend that she return to the emergency department immediately.

## 2018-11-26 NOTE — ED Provider Notes (Addendum)
TIME SEEN: 5:35 AM  CHIEF COMPLAINT: Chest pain  HPI: Patient is a 73 year old male with history of hypertension, hyperlipidemia, atrial fibrillation on Eliquis who presents to the emergency department with complaints of chest pain.  States pain woke him from sleep between 2 and 3 AM.  He has a hard time describing the pain but states it is throughout his entire chest and upper abdomen.  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  States sometimes it does get better with changes in position.  Did not try any medications at home.  Feels somewhat similar to chest pain that he had in February when he was here in the emergency department.  No shortness of breath, nausea, vomiting, diaphoresis, dizziness.  No lower extremity swelling or pain.  States he does have a mild dry cough that he attributes to smoking.  No fevers, chills.  ROS: See HPI Constitutional: no fever  Eyes: no drainage  ENT: no runny nose   Cardiovascular:  chest pain  Resp: no SOB  GI: no vomiting GU: no dysuria Integumentary: no rash  Allergy: no hives  Musculoskeletal: no leg swelling  Neurological: no slurred speech ROS otherwise negative  PAST MEDICAL HISTORY/PAST SURGICAL HISTORY:  Past Medical History:  Diagnosis Date  . A-fib (HCC) 07/17/2014  . High cholesterol   . Hypertension   . Pneumonia     MEDICATIONS:  Prior to Admission medications   Medication Sig Start Date End Date Taking? Authorizing Provider  ammonium lactate (LAC-HYDRIN) 12 % lotion Apply 1 application topically 2 (two) times daily.    [provider]  apixaban (ELIQUIS) 5 MG TABS tablet Take 1 tablet by mouth 2 times a day. Need to schedule appt with cardiologist for refills. 12/28/16   Duke Salvia, MD  aspirin EC 81 MG tablet Take 1 tablet (81 mg total) by mouth daily. Patient not taking: Reported on 10/10/2018 10/20/15   Abelino Derrick, PA-C  diclofenac sodium (VOLTAREN) 1 % GEL Apply 2 g topically 3  (three) times daily as needed.    [provider]  diltiazem (TIAZAC) 180 MG 24 hr capsule Take 180 mg by mouth 2 (two) times daily. 08/23/15   [provider]  DULoxetine (CYMBALTA) 60 MG capsule Take 60 mg by mouth daily. 09/20/15   [provider]  omeprazole (PRILOSEC) 40 MG capsule Take 1 capsule (40 mg total) by mouth daily. 08/23/18 08/23/19  [name], MD  potassium chloride SA (K-DUR,KLOR-CON) 20 MEQ tablet Take 40 mEq by mouth 2 (two) times daily. Morning and evening    [provider]  triamterene-hydrochlorothiazide (MAXZIDE) 75-50 MG per tablet Take 0.5 tablets by mouth every evening.     [provider]    ALLERGIES:  Allergies  Allergen Reactions  . Ace Inhibitors Swelling    Throat swelling; pt was on lisinopril  . Angiotensin Receptor Blockers Swelling    Cross reactivity in known ACE I allergy  . Lisinopril Swelling    Throat swelling  . Losartan Other (See Comments)    Doctor told pt not to take    SOCIAL HISTORY:  Social History   Tobacco Use  . Smoking status: Current Every Day Smoker    Years: 20.00    Types: Cigars    Last attempt to quit: 08/18/2018    Years since quitting: 0.2  . Smokeless tobacco: Never Used  Substance Use Topics  . Alcohol use: Yes  Comment: 1-2 oz per month     FAMILY HISTORY: Family History  Problem Relation Age of Onset  . Thyroid nodules Mother   . Dementia Mother   . Heart failure Father   . Cancer Father   . Kidney disease Son   . Diabetes Brother   . Heart attack Neg Hx   . Stroke Neg Hx     EXAM: BP (!) 152/94 (BP Location: Right Arm)   Pulse 69   Temp 97.7 F (36.5 C) (Oral)   Resp 18   Ht 5\' 10"  (1.778 m)   Wt 105.2 kg   SpO2 96%   BMI 33.29 kg/m  CONSTITUTIONAL: Alert and oriented and responds appropriately to questions. Well-appearing; well-nourished, elderly, no distress HEAD: Normocephalic EYES: Conjunctivae clear, pupils appear equal, EOMI ENT: normal  nose; moist mucous membranes NECK: Supple, no meningismus, no nuchal rigidity, no LAD  CARD: RRR; S1 and S2 appreciated; no murmurs, no clicks, no rubs, no gallops RESP: Normal chest excursion without splinting or tachypnea; breath sounds clear and equal bilaterally; no wheezes, no rhonchi, no rales, no hypoxia or respiratory distress, speaking full sentences ABD/GI: Normal bowel sounds; non-distended; soft, non-tender, no rebound, no guarding, no peritoneal signs, no hepatosplenomegaly BACK:  The back appears normal and is non-tender to palpation, there is no CVA tenderness EXT: Normal ROM in all joints; non-tender to palpation; no edema; normal capillary refill; no cyanosis, no calf tenderness or swelling    SKIN: Normal color for age and race; warm; no rash NEURO: Moves all extremities equally, normal speech, no facial asymmetry, ambulates with normal gait PSYCH: The patient's mood and manner are appropriate. Grooming and personal hygiene are appropriate.  MEDICAL DECISION MAKING: Patient here with chest pain.  This seems to be very atypical for ACS.  He is extremely well-appearing here without tachycardia, tachypnea, hypoxia.  I have low suspicion for dissection or ruptured aneurysm or PE at this time.  No recent infectious symptoms.  EKG shows no new ischemic changes.  May be GI in nature.  Last time that he was here in February 2020 he was given a GI cocktail which seemed to resolve his symptoms.  Will repeat this medication today.  Will obtain cardiac labs, chest x-ray.  He does have some risk factors for ACS.  If first set of cardiac enzymes negative, will obtain second set and if feeling better with 2 normal troponins, anticipate discharge home.  ED PROGRESS: 6:40 AM  Pt's labs unremarkable other than potassium level of 3.0 appears to be chronic for patient.  Troponin negative.  LFTs, lipase normal.  Has mild leukopenia and anemia but has had this previously and it appears stable.  Chest x-ray  clear.  Pain completely resolved after GI cocktail.  Again this seems very atypical in nature.  Plan is to repeat second troponin at 9 AM and if this is negative, will discharge home with outpatient follow-up.  Patient comfortable with this plan.   At this time, I do not feel there is any life-threatening condition present. I have reviewed and discussed all results (EKG, imaging, lab, urine as appropriate) and exam findings with patient/family. I have reviewed nursing notes and appropriate previous records.  I feel the patient is safe to be discharged home without further emergent workup and can continue workup as an outpatient as needed. Discussed usual and customary return precautions. Patient/family verbalize understanding and are comfortable with this plan.  Outpatient follow-up has been provided as needed. All questions have  been answered.      EKG Interpretation  Date/Time:  Tuesday November 26 2018 05:27:25 EDT Ventricular Rate:  75 PR Interval:    QRS Duration: 109 QT Interval:  426 QTC Calculation: 476 R Axis:   -84 Text Interpretation:  Sinus arrhythmia Probable left atrial enlargement LAD, consider left anterior fascicular block RSR' in V1 or V2, right VCD or RVH Borderline prolonged QT interval No significant change since last tracing Confirmed by Ward, Baxter Hire [phone]) on 11/26/2018 5:29:54 AM          Ward, Layla Maw, DO 11/26/18 [phone]

## 2018-11-26 NOTE — ED Triage Notes (Signed)
Pt is c/o chest pain that started this morning around 2 or 3  PT states pain is mainly on the left side but goes all across his chest  Pt says he has had some pain in his back  Pt states he has had a little cough which he is contributing to smoking

## 2018-11-26 NOTE — ED Notes (Signed)
Patient transported to X-ray 

## 2018-12-03 ENCOUNTER — Telehealth: Payer: Self-pay

## 2018-12-03 NOTE — Telephone Encounter (Signed)
Left the pt a message to call the office back.  I was calling the pt to schedule him an appt for an ER visit f/u.

## 2019-01-16 ENCOUNTER — Other Ambulatory Visit: Payer: Self-pay

## 2019-01-16 ENCOUNTER — Ambulatory Visit (INDEPENDENT_AMBULATORY_CARE_PROVIDER_SITE_OTHER): Payer: Medicare Other

## 2019-01-16 VITALS — Ht 70.0 in | Wt 212.0 lb

## 2019-01-16 DIAGNOSIS — Z0000 Encounter for general adult medical examination without abnormal findings: Secondary | ICD-10-CM | POA: Diagnosis not present

## 2019-01-16 NOTE — Patient Instructions (Signed)
Richard Sampson , Thank you for taking time to come for your Medicare Wellness Visit. I appreciate your ongoing commitment to your health goals. Please review the following plan we discussed and let me know if I can assist you in the future.   Screening recommendations/referrals: Colonoscopy: scheduled for [facility] Recommended yearly ophthalmology/optometry visit for glaucoma screening and checkup Recommended yearly dental visit for hygiene and checkup  Vaccinations: Influenza vaccine: 06/2018 Pneumococcal vaccine: had per patient Tdap vaccine: had per patient Shingles vaccine: discussed    Advanced directives: Copy in chart  Conditions/risks identified: Smoking  Next appointment: 05/01/2019 at 8:45  Preventive Care 65 Years and Older, Male Preventive care refers to lifestyle choices and visits with your health care provider that can promote health and wellness. What does preventive care include?  A yearly physical exam. This is also called an annual well check.  Dental exams once or twice a year.  Routine eye exams. Ask your health care provider how often you should have your eyes checked.  Personal lifestyle choices, including:  Daily care of your teeth and gums.  Regular physical activity.  Eating a healthy diet.  Avoiding tobacco and drug use.  Limiting alcohol use.  Practicing safe sex.  Taking low doses of aspirin every day.  Taking vitamin and mineral supplements as recommended by your health care provider. What happens during an annual well check? The services and screenings done by your health care provider during your annual well check will depend on your age, overall health, lifestyle risk factors, and family history of disease. Counseling  Your health care provider may ask you questions about your:  Alcohol use.  Tobacco use.  Drug use.  Emotional well-being.  Home and relationship well-being.  Sexual activity.  Eating habits.  History of falls.   Memory and ability to understand (cognition).  Work and work Astronomer. Screening  You may have the following tests or measurements:  Height, weight, and BMI.  Blood pressure.  Lipid and cholesterol levels. These may be checked every 5 years, or more frequently if you are over 65 years old.  Skin check.  Lung cancer screening. You may have this screening every year starting at age 69 if you have a 30-pack-year history of smoking and currently smoke or have quit within the past 15 years.  Fecal occult blood test (FOBT) of the stool. You may have this test every year starting at age 54.  Flexible sigmoidoscopy or colonoscopy. You may have a sigmoidoscopy every 5 years or a colonoscopy every 10 years starting at age 60.  Prostate cancer screening. Recommendations will vary depending on your family history and other risks.  Hepatitis C blood test.  Hepatitis B blood test.  Sexually transmitted disease (STD) testing.  Diabetes screening. This is done by checking your blood sugar (glucose) after you have not eaten for a while (fasting). You may have this done every 1-3 years.  Abdominal aortic aneurysm (AAA) screening. You may need this if you are a current or former smoker.  Osteoporosis. You may be screened starting at age 70 if you are at high risk. Talk with your health care provider about your test results, treatment options, and if necessary, the need for more tests. Vaccines  Your health care provider may recommend certain vaccines, such as:  Influenza vaccine. This is recommended every year.  Tetanus, diphtheria, and acellular pertussis (Tdap, Td) vaccine. You may need a Td booster every 10 years.  Zoster vaccine. You may need this after  age 44.  Pneumococcal 13-valent conjugate (PCV13) vaccine. One dose is recommended after age 55.  Pneumococcal polysaccharide (PPSV23) vaccine. One dose is recommended after age 82. Talk to your health care provider about which  screenings and vaccines you need and how often you need them. This information is not intended to replace advice given to you by your health care provider. Make sure you discuss any questions you have with your health care provider. Document Released: 09/03/2015 Document Revised: 04/26/2016 Document Reviewed: 06/08/2015 Elsevier Interactive Patient Education  2017 [place] Prevention in the Home Falls can cause injuries. They can happen to people of all ages. There are many things you can do to make your home safe and to help prevent falls. What can I do on the outside of my home?  Regularly fix the edges of walkways and driveways and fix any cracks.  Remove anything that might make you trip as you walk through a door, such as a raised step or threshold.  Trim any bushes or trees on the path to your home.  Use bright outdoor lighting.  Clear any walking paths of anything that might make someone trip, such as rocks or tools.  Regularly check to see if handrails are loose or broken. Make sure that both sides of any steps have handrails.  Any raised decks and porches should have guardrails on the edges.  Have any leaves, snow, or ice cleared regularly.  Use sand or salt on walking paths during winter.  Clean up any spills in your garage right away. This includes oil or grease spills. What can I do in the bathroom?  Use night lights.  Install grab bars by the toilet and in the tub and shower. Do not use towel bars as grab bars.  Use non-skid mats or decals in the tub or shower.  If you need to sit down in the shower, use a plastic, non-slip stool.  Keep the floor dry. Clean up any water that spills on the floor as soon as it happens.  Remove soap buildup in the tub or shower regularly.  Attach bath mats securely with double-sided non-slip rug tape.  Do not have throw rugs and other things on the floor that can make you trip. What can I do in the bedroom?  Use  night lights.  Make sure that you have a light by your bed that is easy to reach.  Do not use any sheets or blankets that are too big for your bed. They should not hang down onto the floor.  Have a firm chair that has side arms. You can use this for support while you get dressed.  Do not have throw rugs and other things on the floor that can make you trip. What can I do in the kitchen?  Clean up any spills right away.  Avoid walking on wet floors.  Keep items that you use a lot in easy-to-reach places.  If you need to reach something above you, use a strong step stool that has a grab bar.  Keep electrical cords out of the way.  Do not use floor polish or wax that makes floors slippery. If you must use wax, use non-skid floor wax.  Do not have throw rugs and other things on the floor that can make you trip. What can I do with my stairs?  Do not leave any items on the stairs.  Make sure that there are handrails on both sides of the stairs  and use them. Fix handrails that are broken or loose. Make sure that handrails are as long as the stairways.  Check any carpeting to make sure that it is firmly attached to the stairs. Fix any carpet that is loose or worn.  Avoid having throw rugs at the top or bottom of the stairs. If you do have throw rugs, attach them to the floor with carpet tape.  Make sure that you have a light switch at the top of the stairs and the bottom of the stairs. If you do not have them, ask someone to add them for you. What else can I do to help prevent falls?  Wear shoes that:  Do not have high heels.  Have rubber bottoms.  Are comfortable and fit you well.  Are closed at the toe. Do not wear sandals.  If you use a stepladder:  Make sure that it is fully opened. Do not climb a closed stepladder.  Make sure that both sides of the stepladder are locked into place.  Ask someone to hold it for you, if possible.  Clearly mark and make sure that you  can see:  Any grab bars or handrails.  First and last steps.  Where the edge of each step is.  Use tools that help you move around (mobility aids) if they are needed. These include:  Canes.  Walkers.  Scooters.  Crutches.  Turn on the lights when you go into a dark area. Replace any light bulbs as soon as they burn out.  Set up your furniture so you have a clear path. Avoid moving your furniture around.  If any of your floors are uneven, fix them.  If there are any pets around you, be aware of where they are.  Review your medicines with your doctor. Some medicines can make you feel dizzy. This can increase your chance of falling. Ask your doctor what other things that you can do to help prevent falls. This information is not intended to replace advice given to you by your health care provider. Make sure you discuss any questions you have with your health care provider. Document Released: 06/03/2009 Document Revised: 01/13/2016 Document Reviewed: 09/11/2014 Elsevier Interactive Patient Education  2017 [employer]

## 2019-01-16 NOTE — Progress Notes (Signed)
Subjective:   Richard Sampson is a 73 y.o. male who presents for Medicare Annual/Subsequent preventive examination.  This visit type was conducted due to national recommendations for restrictions regarding the COVID-19 Pandemic (e.g. social distancing). This format is felt to be most appropriate for this patient at this time. All issues noted in this document were discussed and addressed. No physical exam was performed (except for noted visual exam findings with Video Visits). This patient, Richard Sampson, has given permission to perform this visit via telephone. Vital signs may be absent or patient reported.  Patient location:  At home  Nurse location:  [facility] office     Review of Systems:  n/a Cardiac Risk Factors include: advanced age (>33men, >36 women);hypertension;male gender;dyslipidemia;smoking/ tobacco exposure;sedentary lifestyle     Objective:    Vitals: Ht 5\' 10"  (1.778 m)   Wt 212 lb (96.2 kg) Comment: per patient  BMI 30.42 kg/m   Body mass index is 30.42 kg/m.  Advanced Directives 01/16/2019 11/26/2018 10/09/2018 08/21/2018 07/30/2018 03/02/2016 02/15/2016  Does Patient Have a Medical Advance Directive? Yes No;Yes No No No No No  Type of Estate agent of [place];Living will Living will - - - - -  Does patient want to make changes to medical advance directive? - No - Patient declined - - - - -  Copy of Healthcare Power of Attorney in Chart? Yes - validated most recent copy scanned in chart (See row information) - - - - - -  Would patient like information on creating a medical advance directive? - No - Patient declined - - - No - patient declined information Yes - Educational materials given  Pre-existing out of facility DNR order (yellow form or pink MOST form) - - - - - - -    Tobacco Social History   Tobacco Use  Smoking Status Current Every Day Smoker  . Years: 20.00  . Types: Cigars  . Last attempt to quit: 08/18/2018  . Years since  quitting: 0.4  Smokeless Tobacco Never Used     Ready to quit: Yes Counseling given: Not Answered   Clinical Intake:  Pre-visit preparation completed: Yes  Pain : No/denies pain Pain Score: 0-No pain     Nutritional Status: BMI > 30  Obese Nutritional Risks: None Diabetes: No  How often do you need to have someone help you when you read instructions, pamphlets, or other written materials from your doctor or pharmacy?: 1 - Never What is the last grade level you completed in school?: Bachelor's degree  Interpreter Needed?: No  Information entered by :: NAllen LPN  Past Medical History:  Diagnosis Date  . A-fib (HCC) 07/17/2014  . High cholesterol   . Hypertension   . Pneumonia    Past Surgical History:  Procedure Laterality Date  . TONSILLECTOMY     Family History  Problem Relation Age of Onset  . Thyroid nodules Mother   . Dementia Mother   . Heart failure Father   . Cancer Father   . Kidney disease Son   . Diabetes Brother   . Heart attack Neg Hx   . Stroke Neg Hx    Social History   Socioeconomic History  . Marital status: Married    Spouse name: Not on file  . Number of children: Not on file  . Years of education: Not on file  . Highest education level: Not on file  Occupational History  . Occupation: retired  Engineer, production  .  Financial resource strain: Not hard at all  . Food insecurity:    Worry: Never true    Inability: Never true  . Transportation needs:    Medical: No    Non-medical: No  Tobacco Use  . Smoking status: Current Every Day Smoker    Years: 20.00    Types: Cigars    Last attempt to quit: 08/18/2018    Years since quitting: 0.4  . Smokeless tobacco: Never Used  Substance and Sexual Activity  . Alcohol use: Yes    Comment: 1-2 oz per month   . Drug use: Not Currently  . Sexual activity: Yes  Lifestyle  . Physical activity:    Days per week: 0 days    Minutes per session: 0 min  . Stress: Not at all  Relationships  .  Social connections:    Talks on phone: Not on file    Gets together: Not on file    Attends religious service: Not on file    Active member of club or organization: Not on file    Attends meetings of clubs or organizations: Not on file    Relationship status: Not on file  Other Topics Concern  . Not on file  Social History Narrative  . Not on file    Outpatient Encounter Medications as of 01/16/2019  Medication Sig  . ammonium lactate (LAC-HYDRIN) 12 % lotion Apply 1 application topically 2 (two) times daily.  Marland Kitchen. apixaban (ELIQUIS) 5 MG TABS tablet Take 1 tablet by mouth 2 times a day. Need to schedule appt with cardiologist for refills.  . diclofenac sodium (VOLTAREN) 1 % GEL Apply 2 g topically 3 (three) times daily as needed.  . diltiazem (TIAZAC) 180 MG 24 hr capsule Take 180 mg by mouth 2 (two) times daily.  . DULoxetine (CYMBALTA) 60 MG capsule Take 60 mg by mouth daily.  . potassium chloride SA (K-DUR,KLOR-CON) 20 MEQ tablet Take 40 mEq by mouth 2 (two) times daily. Morning and evening  . triamterene-hydrochlorothiazide (MAXZIDE) 75-50 MG per tablet Take 0.5 tablets by mouth every evening.   Marland Kitchen. aspirin EC 81 MG tablet Take 1 tablet (81 mg total) by mouth daily. (Patient not taking: Reported on 10/10/2018)  . omeprazole (PRILOSEC) 40 MG capsule Take 1 capsule (40 mg total) by mouth daily. (Patient not taking: Reported on 01/16/2019)   No facility-administered encounter medications on file as of 01/16/2019.     Activities of Daily Living In your present state of health, do you have any difficulty performing the following activities: 01/16/2019  Hearing? N  Vision? N  Difficulty concentrating or making decisions? N  Walking or climbing stairs? N  Dressing or bathing? N  Doing errands, shopping? N  Preparing Food and eating ? N  Using the Toilet? N  In the past six months, have you accidently leaked urine? N  Do you have problems with loss of bowel control? N  Managing your  Medications? N  Managing your Finances? N  Housekeeping or managing your Housekeeping? N  Some recent data might be hidden    Patient Care Team: Dorothyann PengSanders, Robyn, MD as PCP - General (Internal Medicine)   Assessment:   This is a routine wellness examination for Fayrene FearingJames.  Exercise Activities and Dietary recommendations Current Exercise Habits: The patient does not participate in regular exercise at present  Goals    . Quit Smoking       Fall Risk Fall Risk  01/16/2019 10/10/2018 09/26/2018 08/23/2018 03/07/2016  Falls  in the past year? 0 0 0 0 No  Risk for fall due to : Medication side effect - - - -  Follow up Falls prevention discussed - - - -   Is the patient's home free of loose throw rugs in walkways, pet beds, electrical cords, etc?   yes      Grab bars in the bathroom? yes      Handrails on the stairs?   n/a      Adequate lighting?   yes  Timed Get Up and Go Performed: n/a  Depression Screen PHQ 2/9 Scores 01/16/2019 10/10/2018 09/26/2018 08/23/2018  PHQ - 2 Score 0 0 0 0  PHQ- 9 Score 0 - - -    Cognitive Function     6CIT Screen 01/16/2019  What Year? 0 points  What month? 0 points  What time? 3 points  Count back from 20 0 points  Months in reverse 0 points  Repeat phrase 0 points  Total Score 3    Immunization History  Administered Date(s) Administered  . Influenza-Unspecified 06/21/2018    Qualifies for Shingles Vaccine? yes  Screening Tests Health Maintenance  Topic Date Due  . FOOT EXAM  05/13/1956  . OPHTHALMOLOGY EXAM  05/13/1956  . URINE MICROALBUMIN  05/13/1958  . COLONOSCOPY  04/30/2018  . TETANUS/TDAP  01/16/2020 (Originally 05/14/1967)  . INFLUENZA VACCINE  03/22/2019  . HEMOGLOBIN A1C  03/27/2019  . PNA vac Low Risk Adult (2 of 2 - PPSV23) 06/22/2019  . Hepatitis C Screening  Completed   Cancer Screenings: Lung: Low Dose CT Chest recommended if Age 43-80 years, 30 pack-year currently smoking OR have quit w/in 15years. Patient does not qualify.  Colorectal: scheduled for August  Additional Screenings:  Hepatitis C Screening:due      Plan:    Wants to quit smoking. States had a tetanus in the past 10 years. States had pneumonia vaccines. Has colonoscopy scheduled for August.  I have personally reviewed and noted the following in the patient's chart:   . Medical and social history . Use of alcohol, tobacco or illicit drugs  . Current medications and supplements . Functional ability and status . Nutritional status . Physical activity . Advanced directives . List of other physicians . Hospitalizations, surgeries, and ER visits in previous 12 months . Vitals . Screenings to include cognitive, depression, and falls . Referrals and appointments  In addition, I have reviewed and discussed with patient certain preventive protocols, quality metrics, and best practice recommendations. A written personalized care plan for preventive services as well as general preventive health recommendations were provided to patient.     Barb Merino, LPN  [id number]

## 2019-05-01 ENCOUNTER — Ambulatory Visit (INDEPENDENT_AMBULATORY_CARE_PROVIDER_SITE_OTHER): Payer: Medicare Other | Admitting: Internal Medicine

## 2019-05-01 ENCOUNTER — Encounter: Payer: Self-pay | Admitting: Internal Medicine

## 2019-05-01 ENCOUNTER — Other Ambulatory Visit: Payer: Self-pay

## 2019-05-01 ENCOUNTER — Ambulatory Visit: Payer: Self-pay

## 2019-05-01 VITALS — BP 138/78 | HR 80 | Temp 98.1°F | Ht 70.0 in | Wt 226.0 lb

## 2019-05-01 DIAGNOSIS — I119 Hypertensive heart disease without heart failure: Secondary | ICD-10-CM | POA: Diagnosis not present

## 2019-05-01 DIAGNOSIS — Z0000 Encounter for general adult medical examination without abnormal findings: Secondary | ICD-10-CM

## 2019-05-01 DIAGNOSIS — Z23 Encounter for immunization: Secondary | ICD-10-CM | POA: Diagnosis not present

## 2019-05-01 DIAGNOSIS — R7309 Other abnormal glucose: Secondary | ICD-10-CM

## 2019-05-01 DIAGNOSIS — I480 Paroxysmal atrial fibrillation: Secondary | ICD-10-CM

## 2019-05-01 DIAGNOSIS — R351 Nocturia: Secondary | ICD-10-CM

## 2019-05-01 DIAGNOSIS — Z6832 Body mass index (BMI) 32.0-32.9, adult: Secondary | ICD-10-CM

## 2019-05-01 DIAGNOSIS — E6609 Other obesity due to excess calories: Secondary | ICD-10-CM

## 2019-05-01 DIAGNOSIS — I77810 Thoracic aortic ectasia: Secondary | ICD-10-CM

## 2019-05-01 LAB — POCT URINALYSIS DIPSTICK
Bilirubin, UA: NEGATIVE
Blood, UA: NEGATIVE
Glucose, UA: NEGATIVE
Ketones, UA: NEGATIVE
Leukocytes, UA: NEGATIVE
Nitrite, UA: NEGATIVE
Protein, UA: NEGATIVE
Spec Grav, UA: 1.025 (ref 1.010–1.025)
Urobilinogen, UA: 0.2 E.U./dL
pH, UA: 6.5 (ref 5.0–8.0)

## 2019-05-01 LAB — POCT UA - MICROALBUMIN
Creatinine, POC: 300 mg/dL
Microalbumin Ur, POC: 80 mg/L

## 2019-05-01 NOTE — Patient Instructions (Signed)

## 2019-05-02 LAB — CMP14+EGFR
ALT: 12 IU/L (ref 0–44)
AST: 16 IU/L (ref 0–40)
Albumin/Globulin Ratio: 1.4 (ref 1.2–2.2)
Albumin: 4.2 g/dL (ref 3.8–4.8)
Alkaline Phosphatase: 82 IU/L (ref 39–117)
BUN/Creatinine Ratio: 12 (ref 10–24)
BUN: 13 mg/dL (ref 8–27)
Bilirubin Total: <0.2 mg/dL (ref 0.0–1.2)
CO2: 25 mmol/L (ref 20–29)
Calcium: 9.6 mg/dL (ref 8.6–10.2)
Chloride: 105 mmol/L (ref 96–106)
Creatinine, Ser: 1.11 mg/dL (ref 0.76–1.27)
GFR calc Af Amer: 77 mL/min/{1.73_m2} (ref >59)
GFR calc non Af Amer: 67 mL/min/{1.73_m2} (ref >59)
Globulin, Total: 2.9 g/dL (ref 1.5–4.5)
Glucose: 102 mg/dL — ABNORMAL HIGH (ref 65–99)
Potassium: 3.6 mmol/L (ref 3.5–5.2)
Sodium: 142 mmol/L (ref 134–144)
Total Protein: 7.1 g/dL (ref 6.0–8.5)

## 2019-05-02 LAB — PSA: Prostate Specific Ag, Serum: 1.2 ng/mL (ref 0.0–4.0)

## 2019-05-02 LAB — CBC
Hematocrit: 35.6 % — ABNORMAL LOW (ref 37.5–51.0)
Hemoglobin: 11.2 g/dL — ABNORMAL LOW (ref 13.0–17.7)
MCH: 26.1 pg — ABNORMAL LOW (ref 26.6–33.0)
MCHC: 31.5 g/dL (ref 31.5–35.7)
MCV: 83 fL (ref 79–97)
Platelets: 261 10*3/uL (ref 150–450)
RBC: 4.29 x10E6/uL (ref 4.14–5.80)
RDW: 16.7 % — ABNORMAL HIGH (ref 11.6–15.4)
WBC: 3.7 10*3/uL (ref 3.4–10.8)

## 2019-05-02 LAB — LIPID PANEL
Chol/HDL Ratio: 3.1 ratio (ref 0.0–5.0)
Cholesterol, Total: 142 mg/dL (ref 100–199)
HDL: 46 mg/dL (ref >39)
LDL Chol Calc (NIH): 82 mg/dL (ref 0–99)
Triglycerides: 70 mg/dL (ref 0–149)
VLDL Cholesterol Cal: 14 mg/dL (ref 5–40)

## 2019-05-02 LAB — HEMOGLOBIN A1C
Est. average glucose Bld gHb Est-mCnc: 123 mg/dL
Hgb A1c MFr Bld: 5.9 % — ABNORMAL HIGH (ref 4.8–5.6)

## 2019-05-18 NOTE — Progress Notes (Signed)
Subjective:     Patient ID: Richard Sampson , male    DOB: 13-Mar-1946 , 73 y.o.   MRN: [id number]   Chief Complaint  Patient presents with  . Annual Exam  . Hypertension    HPI  He is here today for a full physical exam. He reports he has prostate exams performed at the [place]. He has no specific concerns or complaints at this time.   Hypertension This is a chronic problem. The current episode started more than 1 year ago. The problem has been gradually improving since onset. The problem is controlled. Pertinent negatives include no blurred vision, chest pain, palpitations or shortness of breath. Risk factors for coronary artery disease include dyslipidemia, obesity, male gender and sedentary lifestyle. Past treatments include calcium channel blockers and diuretics. The current treatment provides moderate improvement. Compliance problems include exercise.      Past Medical History:  Diagnosis Date  . A-fib ([place]) 07/17/2014  . High cholesterol   . Hypertension   . Pneumonia      Family History  Problem Relation Age of Onset  . Thyroid nodules Mother   . Dementia Mother   . Heart failure Father   . Cancer Father   . Kidney disease Son   . Diabetes Brother   . Heart attack Neg Hx   . Stroke Neg Hx      Current Outpatient Medications:  .  ammonium lactate (LAC-HYDRIN) 12 % lotion, Apply 1 application topically 2 (two) times daily., Disp: , Rfl:  .  apixaban (ELIQUIS) 5 MG TABS tablet, Take 1 tablet by mouth 2 times a day. Need to schedule appt with cardiologist for refills., Disp: 60 tablet, Rfl: 0 .  diclofenac sodium (VOLTAREN) 1 % GEL, Apply 2 g topically 3 (three) times daily as needed., Disp: , Rfl:  .  diltiazem (TIAZAC) 180 MG 24 hr capsule, Take 180 mg by mouth 2 (two) times daily., Disp: , Rfl: 0 .  DULoxetine (CYMBALTA) 60 MG capsule, Take 60 mg by mouth daily., Disp: , Rfl: 3 .  potassium chloride SA (K-DUR,KLOR-CON) 20 MEQ tablet, Take 40 mEq by mouth 2 (two) times  daily. Morning and evening, Disp: , Rfl:  .  triamterene-hydrochlorothiazide (MAXZIDE) 75-50 MG per tablet, Take 0.5 tablets by mouth every evening. , Disp: , Rfl:  .  aspirin EC 81 MG tablet, Take 1 tablet (81 mg total) by mouth daily. (Patient not taking: Reported on 10/10/2018), Disp: 90 tablet, Rfl: 3 .  omeprazole (PRILOSEC) 40 MG capsule, Take 1 capsule (40 mg total) by mouth daily. (Patient not taking: Reported on 01/16/2019), Disp: 30 capsule, Rfl: 1   Allergies  Allergen Reactions  . Ace Inhibitors Swelling    Throat swelling; pt was on lisinopril  . Angiotensin Receptor Blockers Swelling    Cross reactivity in known ACE I allergy  . Lisinopril Swelling    Throat swelling  . Losartan Other (See Comments)    Doctor told pt not to take     Men's preventive visit. Patient Health Questionnaire (PHQ-2) is    Office Visit from 05/01/2019 in [facility]  PHQ-2 Total Score  0    . Patient is on a healthy diet. Marital status: Married. Relevant history for alcohol use is:  Social History   Substance and Sexual Activity  Alcohol Use Yes   Comment: 1-2 oz per month   . Relevant history for tobacco use is:  Social History   Tobacco Use  Smoking  Status Current Every Day Smoker  . Packs/day: 1.00  . Years: 20.00  . Pack years: 20.00  . Types: Cigars  . Last attempt to quit: 08/18/2018  . Years since quitting: 0.7  Smokeless Tobacco Never Used  Tobacco Comment   Started at age 77 - 1ppdx 76, quit x 5; pipex5 years, cigarsx15  .  Review of Systems  Constitutional: Negative.   HENT: Negative.   Eyes: Negative.  Negative for blurred vision.  Respiratory: Negative.  Negative for shortness of breath.   Cardiovascular: Negative.  Negative for chest pain and palpitations.  Endocrine: Negative.   Genitourinary: Negative.        He reports he gets up 1-2 times per night to urinate. He denies urinary frequency during the day.   Musculoskeletal: Negative.    Skin: Negative.   Allergic/Immunologic: Negative.   Neurological: Negative.   Hematological: Negative.   Psychiatric/Behavioral: Negative.      Today's Vitals   05/01/19 0857  BP: 138/78  Pulse: 80  Temp: 98.1 F (36.7 C)  TempSrc: Oral  SpO2: 97%  Weight: 226 lb (102.5 kg)  Height: 5' 10" (1.778 m)   Body mass index is 32.43 kg/m.   Objective:  Physical Exam Vitals signs and nursing note reviewed.  Constitutional:      Appearance: Normal appearance. He is obese.  HENT:     Head: Normocephalic and atraumatic.     Right Ear: Tympanic membrane, ear canal and external ear normal.     Left Ear: Tympanic membrane, ear canal and external ear normal.     Nose: Nose normal.     Mouth/Throat:     Mouth: Mucous membranes are moist.     Pharynx: Oropharynx is clear.  Eyes:     Extraocular Movements: Extraocular movements intact.     Conjunctiva/sclera: Conjunctivae normal.     Pupils: Pupils are equal, round, and reactive to light.  Neck:     Musculoskeletal: Normal range of motion and neck supple.  Cardiovascular:     Rate and Rhythm: Normal rate and regular rhythm.     Pulses: Normal pulses.     Heart sounds: Normal heart sounds.  Pulmonary:     Effort: Pulmonary effort is normal.     Breath sounds: Normal breath sounds.  Chest:     Breasts:        Right: Normal. No swelling, bleeding, inverted nipple, mass or nipple discharge.        Left: Normal. No swelling, bleeding, inverted nipple, mass or nipple discharge.  Abdominal:     General: Abdomen is flat. Bowel sounds are normal.     Palpations: Abdomen is soft.  Genitourinary:    Comments: deferred Musculoskeletal: Normal range of motion.  Skin:    General: Skin is warm.  Neurological:     General: No focal deficit present.     Mental Status: He is alert.  Psychiatric:        Mood and Affect: Mood normal.        Behavior: Behavior normal.         Assessment And Plan:     1. Routine general medical  examination at health care facility  A full exam was performed.  DRE deferred, as per patient request. [facility].  PATIENT HAS BEEN ADVISED TO GET 30-45 MINUTES REGULAR EXERCISE NO LESS THAN FOUR TO FIVE DAYS PER WEEK - BOTH WEIGHTBEARING EXERCISES AND AEROBIC ARE RECOMMENDED.  HE WAS ADVISED TO  FOLLOW A HEALTHY DIET WITH AT LEAST SIX FRUITS/VEGGIES PER DAY, DECREASE INTAKE OF RED MEAT, AND TO INCREASE FISH INTAKE TO TWO DAYS PER WEEK.  MEATS/FISH SHOULD NOT BE FRIED, BAKED OR BROILED IS PREFERABLE.  I SUGGEST WEARING SPF 50 SUNSCREEN ON EXPOSED PARTS AND ESPECIALLY WHEN IN THE DIRECT SUNLIGHT FOR AN EXTENDED PERIOD OF TIME.  PLEASE AVOID FAST FOOD RESTAURANTS AND INCREASE YOUR WATER INTAKE.  - EKG 12-Lead - POCT Urinalysis Dipstick (81002) - POCT UA - Microalbumin  2. Hypertensive heart disease without heart failure  Chronic, fair control. He will continue with current meds. He is encouraged to avoid adding salt to his foods and to incorporate more exercise into his daily routine. EKG performed, no acute changes noted. He will rto in six months for re-evaluation.   - EKG 12-Lead - POCT Urinalysis Dipstick (81002) - POCT UA - Microalbumin - CMP14+EGFR - CBC - Lipid panel  3. Ascending aorta dilatation (HCC)  Chronic, has been stable.   4. Paroxysmal atrial fibrillation (HCC)  Chronic, intermittent. He is currently in sinus rhythm and rate controlled.   5. Other abnormal glucose  HIS A1C HAS BEEN ELEVATED IN THE PAST. I WILL CHECK AN A1C, BMET TODAY. HE WAS ENCOURAGED TO AVOID SUGARY BEVERAGES AND PROCESSED FOODS INCLUDNG BREADS, RICE AND PASTA.  - Hemoglobin A1c  6. Nocturia  I will check PSA. DRE deferred, per his request.   - PSA  7. Class 1 obesity due to excess calories with serious comorbidity and body mass index (BMI) of 32.0 to 32.9 in adult  He is encouraged to strive for BMI less than 28 to decrease cardiac risk. Importance of achieving  optimal weight to decrease risk of cardiovascular disease and cancers was discussed with the patient in full detail. He is encouraged to start slowly - start with 10 minutes twice daily at least three to four days per week and to gradually build to 30 minutes five days weekly. He was given tips to incorporate more activity into her daily routine - take stairs when possible, park farther away from grocery stores, etc.    8. Need for influenza vaccination  - Flu vaccine HIGH DOSE PF (Fluzone High dose)     [name], MD    THE PATIENT IS ENCOURAGED TO PRACTICE SOCIAL DISTANCING DUE TO THE COVID-19 PANDEMIC.

## 2019-09-29 ENCOUNTER — Ambulatory Visit: Payer: TRICARE For Life (TFL)

## 2019-10-29 ENCOUNTER — Other Ambulatory Visit: Payer: Self-pay

## 2019-10-29 ENCOUNTER — Ambulatory Visit (INDEPENDENT_AMBULATORY_CARE_PROVIDER_SITE_OTHER): Payer: Medicare PPO | Admitting: Internal Medicine

## 2019-10-29 ENCOUNTER — Encounter: Payer: Self-pay | Admitting: Internal Medicine

## 2019-10-29 VITALS — BP 136/82 | HR 76 | Temp 98.1°F | Ht 72.0 in | Wt 224.0 lb

## 2019-10-29 DIAGNOSIS — F17200 Nicotine dependence, unspecified, uncomplicated: Secondary | ICD-10-CM | POA: Diagnosis not present

## 2019-10-29 DIAGNOSIS — I119 Hypertensive heart disease without heart failure: Secondary | ICD-10-CM | POA: Diagnosis not present

## 2019-10-29 DIAGNOSIS — I77810 Thoracic aortic ectasia: Secondary | ICD-10-CM | POA: Diagnosis not present

## 2019-10-29 DIAGNOSIS — I480 Paroxysmal atrial fibrillation: Secondary | ICD-10-CM

## 2019-10-29 DIAGNOSIS — Z6830 Body mass index (BMI) 30.0-30.9, adult: Secondary | ICD-10-CM | POA: Diagnosis not present

## 2019-10-29 DIAGNOSIS — E6609 Other obesity due to excess calories: Secondary | ICD-10-CM | POA: Diagnosis not present

## 2019-10-29 DIAGNOSIS — R7309 Other abnormal glucose: Secondary | ICD-10-CM | POA: Diagnosis not present

## 2019-10-29 NOTE — Progress Notes (Signed)
This visit occurred during the SARS-CoV-2 public health emergency.  Safety protocols were in place, including screening questions prior to the visit, additional usage of staff PPE, and extensive cleaning of exam room while observing appropriate contact time as indicated for disinfecting solutions.  Subjective:     Patient ID: Richard Sampson , male    DOB: 31-Dec-1945 , 74 y.o.   MRN: [id number]   Chief Complaint  Patient presents with  . Hypertension    HPI  He presents today for diabetes check. He feels well.  He has no specific concerns or complaints at this time.   Hypertension This is a chronic problem. The current episode started more than 1 year ago. The problem has been gradually improving since onset. The problem is controlled. Pertinent negatives include no blurred vision, chest pain, palpitations or shortness of breath. The current treatment provides moderate improvement. Compliance problems include exercise.      Past Medical History:  Diagnosis Date  . A-fib ([place]) 07/17/2014  . High cholesterol   . Hypertension   . Pneumonia      Family History  Problem Relation Age of Onset  . Thyroid nodules Mother   . Dementia Mother   . Heart failure Father   . Cancer Father   . Kidney disease Son   . Diabetes Brother   . Heart attack Neg Hx   . Stroke Neg Hx      Current Outpatient Medications:  .  ammonium lactate (LAC-HYDRIN) 12 % lotion, Apply 1 application topically 2 (two) times daily., Disp: , Rfl:  .  apixaban (ELIQUIS) 5 MG TABS tablet, Take 1 tablet by mouth 2 times a day. Need to schedule appt with cardiologist for refills., Disp: 60 tablet, Rfl: 0 .  diclofenac sodium (VOLTAREN) 1 % GEL, Apply 2 g topically 3 (three) times daily as needed., Disp: , Rfl:  .  diltiazem (TIAZAC) 180 MG 24 hr capsule, Take 180 mg by mouth 2 (two) times daily., Disp: , Rfl: 0 .  potassium chloride SA (K-DUR,KLOR-CON) 20 MEQ tablet, Take 40 mEq by mouth 2 (two) times daily.  Morning and evening, Disp: , Rfl:  .  triamterene-hydrochlorothiazide (MAXZIDE) 75-50 MG per tablet, Take 0.5 tablets by mouth every evening. , Disp: , Rfl:  .  omeprazole (PRILOSEC) 40 MG capsule, Take 1 capsule (40 mg total) by mouth daily. (Patient not taking: Reported on 01/16/2019), Disp: 30 capsule, Rfl: 1   Allergies  Allergen Reactions  . Ace Inhibitors Swelling    Throat swelling; pt was on lisinopril  . Angiotensin Receptor Blockers Swelling    Cross reactivity in known ACE I allergy  . Lisinopril Swelling    Throat swelling  . Losartan Other (See Comments)    Doctor told pt not to take     Review of Systems  Constitutional: Negative.   Eyes: Negative for blurred vision.  Respiratory: Negative.  Negative for shortness of breath.   Cardiovascular: Negative.  Negative for chest pain and palpitations.  Gastrointestinal: Negative.   Neurological: Negative.   Psychiatric/Behavioral: Negative.      Today's Vitals   10/29/19 0901  BP: 136/82  Pulse: 76  Temp: 98.1 F (36.7 C)  TempSrc: Oral  SpO2: 94%  Weight: 224 lb (101.6 kg)  Height: 6' (1.829 m)   Body mass index is 30.38 kg/m.   Objective:  Physical Exam Vitals and nursing note reviewed.  Constitutional:      Appearance: Normal appearance. He is obese.  Cardiovascular:     Rate and Rhythm: Normal rate and regular rhythm.     Heart sounds: Normal heart sounds.  Pulmonary:     Effort: Pulmonary effort is normal.     Breath sounds: Normal breath sounds.  Skin:    General: Skin is warm.  Neurological:     General: No focal deficit present.     Mental Status: He is alert.  Psychiatric:        Mood and Affect: Mood normal.         Assessment And Plan:     1. Hypertensive heart disease without heart failure  Chronic, fair control. He is aware optimal BP is less than 130/80. He is encouraged to incorporate more exercise into her daily routine.   - CMP14+EGFR - Lipid panel  2. Ascending aorta  dilatation (HCC)  Chronic. He has not seen cardiology since 2017. I will refer her for f/u. Pt advised he will likely be scheduled for f/u [facility].   3. Paroxysmal atrial fibrillation (HCC)  Chronic, he is currently in normal sinus rhythm. Importance of medication compliance was discussed with the patient.   - TSH  4. Other abnormal glucose  HIS A1C HAS BEEN ELEVATED IN THE PAST. I WILL CHECK AN A1C, BMET TODAY. SHE WAS ENCOURAGED TO AVOID SUGARY BEVERAGES AND PROCESSED FOODS INCLUDNG BREADS, RICE AND PASTA.  - Hemoglobin A1c  5. Class 1 obesity due to excess calories with serious comorbidity and body mass index (BMI) of 30.0 to 30.9 in adult  He is encouraged to strive for BMi less than 27 to decrease cardiac risk. He is to aim for exercising at least 30 minutes five days per week. He plans to starting riding his bicycle on a regular basis.   6. Tobacco use disorder  He has greater than 20-pack year history of tobacco use.   - CT CHEST LUNG CA SCREEN LOW DOSE W/O CM; Future       [name], MD    THE PATIENT IS ENCOURAGED TO PRACTICE SOCIAL DISTANCING DUE TO THE COVID-19 PANDEMIC.

## 2019-10-29 NOTE — Patient Instructions (Signed)

## 2019-10-30 LAB — CMP14+EGFR
ALT: 11 IU/L (ref 0–44)
AST: 14 IU/L (ref 0–40)
Albumin/Globulin Ratio: 1.6 (ref 1.2–2.2)
Albumin: 4.1 g/dL (ref 3.7–4.7)
Alkaline Phosphatase: 84 IU/L (ref 39–117)
BUN/Creatinine Ratio: 8 — ABNORMAL LOW (ref 10–24)
BUN: 11 mg/dL (ref 8–27)
Bilirubin Total: 0.3 mg/dL (ref 0.0–1.2)
CO2: 27 mmol/L (ref 20–29)
Calcium: 9.3 mg/dL (ref 8.6–10.2)
Chloride: 103 mmol/L (ref 96–106)
Creatinine, Ser: 1.33 mg/dL — ABNORMAL HIGH (ref 0.76–1.27)
GFR calc Af Amer: 62 mL/min/{1.73_m2} (ref >59)
GFR calc non Af Amer: 53 mL/min/{1.73_m2} — ABNORMAL LOW (ref >59)
Globulin, Total: 2.6 g/dL (ref 1.5–4.5)
Glucose: 97 mg/dL (ref 65–99)
Potassium: 4 mmol/L (ref 3.5–5.2)
Sodium: 144 mmol/L (ref 134–144)
Total Protein: 6.7 g/dL (ref 6.0–8.5)

## 2019-10-30 LAB — HEMOGLOBIN A1C
Est. average glucose Bld gHb Est-mCnc: 117 mg/dL
Hgb A1c MFr Bld: 5.7 % — ABNORMAL HIGH (ref 4.8–5.6)

## 2019-10-30 LAB — LIPID PANEL
Chol/HDL Ratio: 2.9 ratio (ref 0.0–5.0)
Cholesterol, Total: 147 mg/dL (ref 100–199)
HDL: 50 mg/dL (ref >39)
LDL Chol Calc (NIH): 82 mg/dL (ref 0–99)
Triglycerides: 74 mg/dL (ref 0–149)
VLDL Cholesterol Cal: 15 mg/dL (ref 5–40)

## 2019-10-30 LAB — TSH: TSH: 0.727 u[IU]/mL (ref 0.450–4.500)

## 2019-11-04 ENCOUNTER — Ambulatory Visit: Payer: TRICARE For Life (TFL) | Admitting: Student

## 2019-11-20 ENCOUNTER — Ambulatory Visit (HOSPITAL_BASED_OUTPATIENT_CLINIC_OR_DEPARTMENT_OTHER)
Admission: RE | Admit: 2019-11-20 | Discharge: 2019-11-20 | Disposition: A | Discharge status: Home / Self Care | Payer: Medicare PPO | Source: Ambulatory Visit | Attending: Internal Medicine | Admitting: Internal Medicine

## 2019-11-20 ENCOUNTER — Other Ambulatory Visit: Payer: Self-pay

## 2019-11-20 DIAGNOSIS — Z87891 Personal history of nicotine dependence: Secondary | ICD-10-CM | POA: Diagnosis not present

## 2019-11-20 DIAGNOSIS — F17200 Nicotine dependence, unspecified, uncomplicated: Secondary | ICD-10-CM | POA: Insufficient documentation

## 2019-11-24 ENCOUNTER — Other Ambulatory Visit: Payer: Self-pay

## 2019-11-24 MED ORDER — ROSUVASTATIN CALCIUM 10 MG PO TABS: ORAL_TABLET | ORAL | 1 refills | Status: DC

## 2019-12-12 ENCOUNTER — Ambulatory Visit: Payer: TRICARE For Life (TFL) | Admitting: Cardiology

## 2019-12-16 ENCOUNTER — Other Ambulatory Visit: Payer: Self-pay

## 2019-12-16 ENCOUNTER — Encounter: Payer: Self-pay | Admitting: Cardiology

## 2019-12-16 ENCOUNTER — Ambulatory Visit (INDEPENDENT_AMBULATORY_CARE_PROVIDER_SITE_OTHER): Payer: Medicare PPO | Admitting: Cardiology

## 2019-12-16 VITALS — BP 124/70 | HR 80 | Ht 72.0 in | Wt 222.0 lb

## 2019-12-16 DIAGNOSIS — I2510 Atherosclerotic heart disease of native coronary artery without angina pectoris: Secondary | ICD-10-CM

## 2019-12-16 DIAGNOSIS — I119 Hypertensive heart disease without heart failure: Secondary | ICD-10-CM

## 2019-12-16 DIAGNOSIS — I700 Atherosclerosis of aorta: Secondary | ICD-10-CM | POA: Diagnosis not present

## 2019-12-16 NOTE — Progress Notes (Signed)
Cardiology Consult  Note    Date:  12/16/2019   ID:  Richard Sampson, DOB 1945-12-14, MRN [id number]  PCP:  [name], MD  Cardiologist:  Armanda Magic, MD   Chief Complaint  Patient presents with  . New Patient (Initial Visit)    Aortic atherosclerosis    History of Present Illness:  Richard Sampson is a 74 y.o. male who is being seen today for the evaluation of aortic atherosclerosis and coronary calcificatoins at the request of [name], MD.  This is a 74yo male with a hx of HTN, HLD, PAF and recently underwent Chest CT for lung CA screening which showed aortic atherosclerosis and coronary calcifications.  He has a hx of small ascending aortic aneurysm with dimensions 4mm of chest CTA in 2017 but recent chest CT was non contrasted.   He denies any chest pain or pressure, SOB, DOE, PND, orthopnea, LE edema, dizziness, palpitations or syncope. He is compliant with his meds and is tolerating meds with no SE.    Past Medical History:  Diagnosis Date  . A-fib ([facility]) 07/17/2014  . High cholesterol   . Hypertension   . Pneumonia     Past Surgical History:  Procedure Laterality Date  . TONSILLECTOMY      Current Medications: Current Meds  Medication Sig  . ammonium lactate (LAC-HYDRIN) 12 % lotion Apply 1 application topically 2 (two) times daily.  Marland Kitchen apixaban (ELIQUIS) 5 MG TABS tablet Take 1 tablet by mouth 2 times a day. Need to schedule appt with cardiologist for refills.  . diclofenac sodium (VOLTAREN) 1 % GEL Apply 2 g topically 3 (three) times daily as needed.  . diltiazem (TIAZAC) 180 MG 24 hr capsule Take 180 mg by mouth 2 (two) times daily.  . potassium chloride SA (K-DUR,KLOR-CON) 20 MEQ tablet Take 40 mEq by mouth 2 (two) times daily. Morning and evening  . triamterene-hydrochlorothiazide (MAXZIDE) 75-50 MG per tablet Take 0.5 tablets by mouth every evening.     Allergies:   Ace inhibitors, Angiotensin receptor blockers, Lisinopril, and Losartan    Social History   Socioeconomic History  . Marital status: Married    Spouse name: Not on file  . Number of children: Not on file  . Years of education: Not on file  . Highest education level: Not on file  Occupational History  . Occupation: retired  Tobacco Use  . Smoking status: Current Every Day Smoker    Packs/day: 1.00    Years: 20.00    Pack years: 20.00    Types: Cigars    Start date: 35    Last attempt to quit: 08/18/2018    Years since quitting: 1.3  . Smokeless tobacco: Never Used  . Tobacco comment: Started at age 46 - 1ppdx 45, quit x 5; pipex5 years, cigarsx15  Substance and Sexual Activity  . Alcohol use: Yes    Comment: 1-2 oz per month   . Drug use: Not Currently  . Sexual activity: Yes  Other Topics Concern  . Not on file  Social History Narrative  . Not on file   Social Determinants of Health   Financial Resource Strain: Low Risk   . Difficulty of Paying Living Expenses: Not hard at all  Food Insecurity: No Food Insecurity  . Worried About Programme researcher, broadcasting/film/video in the Last Year: Never true  . Ran Out of Food in the Last Year: Never true  Transportation Needs: No Transportation Needs  . Lack of  Transportation (Medical): No  . Lack of Transportation (Non-Medical): No  Physical Activity: Inactive  . Days of Exercise per Week: 0 days  . Minutes of Exercise per Session: 0 min  Stress: No Stress Concern Present  . Feeling of Stress : Not at all  Social Connections:   . Frequency of Communication with Friends and Family:   . Frequency of Social Gatherings with Friends and Family:   . Attends Religious Services:   . Active Member of Clubs or Organizations:   . Attends Banker Meetings:   Marland Kitchen Marital Status:      Family History:  The patient's family history includes Cancer in his father; Dementia in his mother; Diabetes in his brother; Heart failure in his father; Kidney disease in his son; Thyroid nodules in his mother.   ROS:    Please see the history of present illness.    ROS All other systems reviewed and are negative.  No flowsheet data found.     PHYSICAL EXAM:   VS:  BP 124/70   Pulse 80   Ht 6' (1.829 m)   Wt 222 lb (100.7 kg)   SpO2 97%   BMI 30.11 kg/m    GEN: Well nourished, well developed, in no acute distress  HEENT: normal  Neck: no JVD, carotid bruits, or masses Cardiac: RRR; no murmurs, rubs, or gallops,no edema.  Intact distal pulses bilaterally.  Respiratory:  clear to auscultation bilaterally, normal work of breathing GI: soft, nontender, nondistended, + BS MS: no deformity or atrophy  Skin: warm and dry, no rash Neuro:  Alert and Oriented x 3, Strength and sensation are intact Psych: euthymic mood, full affect  Wt Readings from Last 3 Encounters:  12/16/19 222 lb (100.7 kg)  10/29/19 224 lb (101.6 kg)  05/01/19 226 lb (102.5 kg)      Studies/Labs Reviewed:   EKG:  EKG is ordered today.  The ekg ordered today demonstrates NSR with no ST changes and boderline LVHr  Recent Labs: 05/01/2019: Hemoglobin 11.2; Platelets 261 10/29/2019: ALT 11; BUN 11; Creatinine, Ser 1.33; Potassium 4.0; Sodium 144; TSH 0.727   Lipid Panel    Component Value Date/Time   CHOL 147 10/29/2019 1000   TRIG 74 10/29/2019 1000   HDL 50 10/29/2019 1000   CHOLHDL 2.9 10/29/2019 1000   LDLCALC 82 10/29/2019 1000    Additional studies/ records that were reviewed today include:  Chest CT    ASSESSMENT:    1. Aortic atherosclerosis (HCC)   2. Coronary artery calcification seen on CAT scan   3. Hypertensive heart disease without heart failure      PLAN:  In order of problems listed above:  1.  Aortic atherosclerosis -recent Chest CT noncontrasted showed calcifications in the aorta with no aneurysmal dilatation (chest CTA in 2017 showed ascending thoracic aorta mildly dilated at 37mm) but it was noncontrasted study -I tried to measure ascending aorta and appears enlarged -will re-measure  on CT done today for calcium score -needs aggressive risk factor modification -BP controlled  2.  Coronary artery calcifications -he denies any chest pain -will get a coronary Calcium score to assess risk  3.  HTN -EKG showed LVH and 2D echo in 2017 showed mil LVH -BP controlled on exam today.  -continue Cardizem 180mg  mg BID and Maxzide 75-50mg  daily  4.  HLD -LDL currently 82 and he says that his PCP recently started him on a lipid lowering agent but he does not know what it  is and will bring up his paper from the car -if Calcium score is high he will need an LDL < 70    Medication Adjustments/Labs and Tests Ordered: Current medicines are reviewed at length with the patient today.  Concerns regarding medicines are outlined above.  Medication changes, Labs and Tests ordered today are listed in the Patient Instructions below.  There are no Patient Instructions on file for this visit.   Signed, Fransico Him, MD  12/16/2019 2:08 PM    [facility] [address], [place], [place]  [address] Phone: [phone]; Fax: [phone]

## 2019-12-16 NOTE — Patient Instructions (Signed)
Medication Instructions:  Your physician recommends that you continue on your current medications as directed. Please refer to the Current Medication list given to you today.  *If you need a refill on your cardiac medications before your next appointment, please call your pharmacy*  Testing/Procedures: Your provider recommends that you have a Calcium CT scan.     Follow-Up: At [facility], you and your health needs are our priority.  As part of our continuing mission to provide you with exceptional heart care, we have created designated Provider Care Teams.  These Care Teams include your primary Cardiologist (physician) and Advanced Practice Providers (APPs -  Physician Assistants and Nurse Practitioners) who all work together to provide you with the care you need, when you need it.   Your next appointment:   1 year(s)  The format for your next appointment:   In Person  Provider:   You may see Armanda Magic, MD or one of the following Advanced Practice Providers on your designated Care Team:    Ronie Spies, PA-C  Jacolyn Reedy, PA-C

## 2019-12-23 ENCOUNTER — Ambulatory Visit (INDEPENDENT_AMBULATORY_CARE_PROVIDER_SITE_OTHER)
Admission: RE | Admit: 2019-12-23 | Discharge: 2019-12-23 | Disposition: A | Discharge status: Home / Self Care | Payer: Self-pay | Source: Ambulatory Visit | Attending: Cardiology | Admitting: Cardiology

## 2019-12-23 ENCOUNTER — Other Ambulatory Visit: Payer: Self-pay

## 2019-12-23 DIAGNOSIS — I2510 Atherosclerotic heart disease of native coronary artery without angina pectoris: Secondary | ICD-10-CM

## 2019-12-25 ENCOUNTER — Telehealth: Payer: Self-pay | Admitting: Cardiology

## 2019-12-25 DIAGNOSIS — I2510 Atherosclerotic heart disease of native coronary artery without angina pectoris: Secondary | ICD-10-CM

## 2019-12-25 DIAGNOSIS — I700 Atherosclerosis of aorta: Secondary | ICD-10-CM

## 2019-12-25 NOTE — Telephone Encounter (Signed)
   Pt is returning call from [facility] regarding CT result

## 2019-12-25 NOTE — Telephone Encounter (Signed)
Richard Reichert, MD  [name], MD; Theresia Majors, RN  Coronary calcium score elevated. Patient recently started on statin. Please get an FLP and ALT in 6 weeks. LDL goal < 70. Please set patient up for stress myoview

## 2020-01-01 ENCOUNTER — Telehealth (HOSPITAL_COMMUNITY): Payer: Self-pay

## 2020-01-01 NOTE — Telephone Encounter (Signed)
Attempted to contact the patient, without success. Will try again later. S.Kaelynne Christley EMTP 

## 2020-01-02 ENCOUNTER — Other Ambulatory Visit (HOSPITAL_COMMUNITY)
Admission: RE | Admit: 2020-01-02 | Discharge: 2020-01-02 | Disposition: A | Discharge status: Home / Self Care | Payer: Medicare PPO | Source: Ambulatory Visit | Attending: Cardiology | Admitting: Cardiology

## 2020-01-02 DIAGNOSIS — Z01812 Encounter for preprocedural laboratory examination: Secondary | ICD-10-CM | POA: Diagnosis not present

## 2020-01-02 DIAGNOSIS — Z20822 Contact with and (suspected) exposure to covid-19: Secondary | ICD-10-CM | POA: Insufficient documentation

## 2020-01-02 LAB — SARS CORONAVIRUS 2 (TAT 6-24 HRS): SARS Coronavirus 2: NEGATIVE

## 2020-01-06 ENCOUNTER — Other Ambulatory Visit: Payer: Self-pay

## 2020-01-06 ENCOUNTER — Ambulatory Visit (HOSPITAL_COMMUNITY): Payer: Medicare PPO | Attending: Internal Medicine

## 2020-01-06 DIAGNOSIS — I2510 Atherosclerotic heart disease of native coronary artery without angina pectoris: Secondary | ICD-10-CM | POA: Diagnosis not present

## 2020-01-06 DIAGNOSIS — I700 Atherosclerosis of aorta: Secondary | ICD-10-CM | POA: Diagnosis not present

## 2020-01-06 LAB — MYOCARDIAL PERFUSION IMAGING
Estimated workload: 9.3 METS
Exercise duration (min): 7 min
Exercise duration (sec): 30 s
LV dias vol: 100 mL (ref 62–150)
LV sys vol: 40 mL
MPHR: 149 {beats}/min
Peak HR: 139 {beats}/min
Percent HR: 93 %
Rest HR: 75 {beats}/min
SDS: 5
SRS: 0
SSS: 5
TID: 0.85

## 2020-01-06 MED ORDER — TECHNETIUM TC 99M TETROFOSMIN IV KIT
10.2000 | PACK | Freq: Once | INTRAVENOUS | Status: AC | PRN
  Administered 2020-01-06: 10.2 via INTRAVENOUS
  Filled 2020-01-06: qty 11

## 2020-01-06 MED ORDER — TECHNETIUM TC 99M TETROFOSMIN IV KIT
30.8000 | PACK | Freq: Once | INTRAVENOUS | Status: AC | PRN
  Administered 2020-01-06: 30.8 via INTRAVENOUS
  Filled 2020-01-06: qty 31

## 2020-01-20 ENCOUNTER — Ambulatory Visit: Payer: Medicare Other

## 2020-01-28 ENCOUNTER — Other Ambulatory Visit: Payer: Self-pay

## 2020-01-28 ENCOUNTER — Ambulatory Visit (INDEPENDENT_AMBULATORY_CARE_PROVIDER_SITE_OTHER): Payer: Medicare PPO

## 2020-01-28 VITALS — BP 136/78 | HR 84 | Temp 98.2°F | Ht 69.0 in | Wt 221.4 lb

## 2020-01-28 DIAGNOSIS — Z0000 Encounter for general adult medical examination without abnormal findings: Secondary | ICD-10-CM

## 2020-01-28 NOTE — Patient Instructions (Signed)
Richard Sampson , Thank you for taking time to come for your Medicare Wellness Visit. I appreciate your ongoing commitment to your health goals. Please review the following plan we discussed and let me know if I can assist you in the future.   Screening recommendations/referrals: [facility] Recommended yearly ophthalmology/optometry visit for glaucoma screening and checkup Recommended yearly dental visit for hygiene and checkup  Vaccinations: Influenza vaccine: 04/2019 Pneumococcal vaccine: 01/2017 Tdap vaccine: 07/2010 Shingles vaccine: discussed    Advanced directives: copy in chart  Conditions/risks identified: obesity, smoking  Next appointment: 03/01/2020 at 9:15  Preventive Care 65 Years and Older, Male Preventive care refers to lifestyle choices and visits with your health care provider that can promote health and wellness. What does preventive care include?  A yearly physical exam. This is also called an annual well check.  Dental exams once or twice a year.  Routine eye exams. Ask your health care provider how often you should have your eyes checked.  Personal lifestyle choices, including:  Daily care of your teeth and gums.  Regular physical activity.  Eating a healthy diet.  Avoiding tobacco and drug use.  Limiting alcohol use.  Practicing safe sex.  Taking low doses of aspirin every day.  Taking vitamin and mineral supplements as recommended by your health care provider. What happens during an annual well check? The services and screenings done by your health care provider during your annual well check will depend on your age, overall health, lifestyle risk factors, and family history of disease. Counseling  Your health care provider may ask you questions about your:  Alcohol use.  Tobacco use.  Drug use.  Emotional well-being.  Home and relationship well-being.  Sexual activity.  Eating habits.  History of  falls.  Memory and ability to understand (cognition).  Work and work Astronomer. Screening  You may have the following tests or measurements:  Height, weight, and BMI.  Blood pressure.  Lipid and cholesterol levels. These may be checked every 5 years, or more frequently if you are over 2 years old.  Skin check.  Lung cancer screening. You may have this screening every year starting at age 82 if you have a 30-pack-year history of smoking and currently smoke or have quit within the past 15 years.  Fecal occult blood test (FOBT) of the stool. You may have this test every year starting at age 43.  Flexible sigmoidoscopy or colonoscopy. You may have a sigmoidoscopy every 5 years or a colonoscopy every 10 years starting at age 41.  Prostate cancer screening. Recommendations will vary depending on your family history and other risks.  Hepatitis C blood test.  Hepatitis B blood test.  Sexually transmitted disease (STD) testing.  Diabetes screening. This is done by checking your blood sugar (glucose) after you have not eaten for a while (fasting). You may have this done every 1-3 years.  Abdominal aortic aneurysm (AAA) screening. You may need this if you are a current or former smoker.  Osteoporosis. You may be screened starting at age 74 if you are at high risk. Talk with your health care provider about your test results, treatment options, and if necessary, the need for more tests. Vaccines  Your health care provider may recommend certain vaccines, such as:  Influenza vaccine. This is recommended every year.  Tetanus, diphtheria, and acellular pertussis (Tdap, Td) vaccine. You may need a Td booster every 10 years.  Zoster vaccine. You may need this after age 96.  Pneumococcal 13-valent conjugate (PCV13) vaccine. One dose is recommended after age 76.  Pneumococcal polysaccharide (PPSV23) vaccine. One dose is recommended after age 20. Talk to your health care provider about  which screenings and vaccines you need and how often you need them. This information is not intended to replace advice given to you by your health care provider. Make sure you discuss any questions you have with your health care provider. Document Released: 09/03/2015 Document Revised: 04/26/2016 Document Reviewed: 06/08/2015 Elsevier Interactive Patient Education  2017 [place] Prevention in the Home Falls can cause injuries. They can happen to people of all ages. There are many things you can do to make your home safe and to help prevent falls. What can I do on the outside of my home?  Regularly fix the edges of walkways and driveways and fix any cracks.  Remove anything that might make you trip as you walk through a door, such as a raised step or threshold.  Trim any bushes or trees on the path to your home.  Use bright outdoor lighting.  Clear any walking paths of anything that might make someone trip, such as rocks or tools.  Regularly check to see if handrails are loose or broken. Make sure that both sides of any steps have handrails.  Any raised decks and porches should have guardrails on the edges.  Have any leaves, snow, or ice cleared regularly.  Use sand or salt on walking paths during winter.  Clean up any spills in your garage right away. This includes oil or grease spills. What can I do in the bathroom?  Use night lights.  Install grab bars by the toilet and in the tub and shower. Do not use towel bars as grab bars.  Use non-skid mats or decals in the tub or shower.  If you need to sit down in the shower, use a plastic, non-slip stool.  Keep the floor dry. Clean up any water that spills on the floor as soon as it happens.  Remove soap buildup in the tub or shower regularly.  Attach bath mats securely with double-sided non-slip rug tape.  Do not have throw rugs and other things on the floor that can make you trip. What can I do in the  bedroom?  Use night lights.  Make sure that you have a light by your bed that is easy to reach.  Do not use any sheets or blankets that are too big for your bed. They should not hang down onto the floor.  Have a firm chair that has side arms. You can use this for support while you get dressed.  Do not have throw rugs and other things on the floor that can make you trip. What can I do in the kitchen?  Clean up any spills right away.  Avoid walking on wet floors.  Keep items that you use a lot in easy-to-reach places.  If you need to reach something above you, use a strong step stool that has a grab bar.  Keep electrical cords out of the way.  Do not use floor polish or wax that makes floors slippery. If you must use wax, use non-skid floor wax.  Do not have throw rugs and other things on the floor that can make you trip. What can I do with my stairs?  Do not leave any items on the stairs.  Make sure that there are handrails on both sides of the stairs and use them.  Fix handrails that are broken or loose. Make sure that handrails are as long as the stairways.  Check any carpeting to make sure that it is firmly attached to the stairs. Fix any carpet that is loose or worn.  Avoid having throw rugs at the top or bottom of the stairs. If you do have throw rugs, attach them to the floor with carpet tape.  Make sure that you have a light switch at the top of the stairs and the bottom of the stairs. If you do not have them, ask someone to add them for you. What else can I do to help prevent falls?  Wear shoes that:  Do not have high heels.  Have rubber bottoms.  Are comfortable and fit you well.  Are closed at the toe. Do not wear sandals.  If you use a stepladder:  Make sure that it is fully opened. Do not climb a closed stepladder.  Make sure that both sides of the stepladder are locked into place.  Ask someone to hold it for you, if possible.  Clearly mark and make  sure that you can see:  Any grab bars or handrails.  First and last steps.  Where the edge of each step is.  Use tools that help you move around (mobility aids) if they are needed. These include:  Canes.  Walkers.  Scooters.  Crutches.  Turn on the lights when you go into a dark area. Replace any light bulbs as soon as they burn out.  Set up your furniture so you have a clear path. Avoid moving your furniture around.  If any of your floors are uneven, fix them.  If there are any pets around you, be aware of where they are.  Review your medicines with your doctor. Some medicines can make you feel dizzy. This can increase your chance of falling. Ask your doctor what other things that you can do to help prevent falls. This information is not intended to replace advice given to you by your health care provider. Make sure you discuss any questions you have with your health care provider. Document Released: 06/03/2009 Document Revised: 01/13/2016 Document Reviewed: 09/11/2014 Elsevier Interactive Patient Education  2017 [employer].

## 2020-01-28 NOTE — Progress Notes (Addendum)
This visit occurred during the SARS-CoV-2 public health emergency.  Safety protocols were in place, including screening questions prior to the visit, additional usage of staff PPE, and extensive cleaning of exam room while observing appropriate contact time as indicated for disinfecting solutions.  Subjective:   Richard Sampson is a 74 y.o. male who presents for Medicare Annual/Subsequent preventive examination.  Review of Systems:  n/a Cardiac Risk Factors include: advanced age (>73men, >106 women);smoking/ tobacco exposure;hypertension;male gender;obesity (BMI >30kg/m2)     Objective:    Vitals: BP 136/78 (BP Location: Left Arm, Patient Position: Sitting, Cuff Size: Normal)   Pulse 84   Temp 98.2 F (36.8 C) (Oral)   Ht 5\' 9"  (1.753 m)   Wt 221 lb 6.4 oz (100.4 kg)   SpO2 96%   BMI 32.70 kg/m   Body mass index is 32.7 kg/m.  Advanced Directives 01/28/2020 01/16/2019 11/26/2018 10/09/2018 08/21/2018 07/30/2018 03/02/2016  Does Patient Have a Medical Advance Directive? Yes Yes No;Yes No No No No  Type of 03/04/2016 of [place];Living will Healthcare Power of [place];Living will Living will - - - -  Does patient want to make changes to medical advance directive? - - No - Patient declined - - - -  Copy of Healthcare Power of Attorney in Chart? Yes - validated most recent copy scanned in chart (See row information) Yes - validated most recent copy scanned in chart (See row information) - - - - -  Would patient like information on creating a medical advance directive? - - No - Patient declined - - - No - patient declined information  Pre-existing out of facility DNR order (yellow form or pink MOST form) - - - - - - -    Tobacco Social History   Tobacco Use  Smoking Status Current Every Day Smoker  . Packs/day: 0.25  . Years: 20.00  . Pack years: 5.00  . Types: Cigars  . Start date: 35  . Last attempt to quit: 08/18/2018  . Years since quitting: 1.4    Smokeless Tobacco Never Used  Tobacco Comment   Started at age 39 - 1ppdx 64, quit x 5; pipex5 years, cigarsx15     Ready to quit: Yes Counseling given: Yes Comment: Started at age 79 - 1ppdx 76, quit x 5; pipex5 years, cigarsx15   Clinical Intake:  Pre-visit preparation completed: Yes  Pain : 0-10 Pain Score: 3  Pain Type: Chronic pain Pain Location: Generalized Pain Descriptors / Indicators: Dull, Numbness, Sharp Pain Onset: More than a month ago Pain Frequency: Constant Pain Relieving Factors: pain cream eases it slightly  Pain Relieving Factors: pain cream eases it slightly  Nutritional Status: BMI > 30  Obese Nutritional Risks: None Diabetes: No  How often do you need to have someone help you when you read instructions, pamphlets, or other written materials from your doctor or pharmacy?: 1 - Never What is the last grade level you completed in school?: just slight of master's degree  Interpreter Needed?: No  Information entered by :: NAllen LPN  Past Medical History:  Diagnosis Date  . A-fib (HCC) 07/17/2014  . High cholesterol   . Hypertension   . Pneumonia    Past Surgical History:  Procedure Laterality Date  . TONSILLECTOMY     Family History  Problem Relation Age of Onset  . Thyroid nodules Mother   . Dementia Mother   . Heart failure Father   . Cancer Father   . Kidney disease  Son   . Diabetes Brother   . Cancer Brother   . Heart attack Neg Hx   . Stroke Neg Hx    Social History   Socioeconomic History  . Marital status: Married    Spouse name: Not on file  . Number of children: Not on file  . Years of education: Not on file  . Highest education level: Not on file  Occupational History  . Occupation: retired  Tobacco Use  . Smoking status: Current Every Day Smoker    Packs/day: 0.25    Years: 20.00    Pack years: 5.00    Types: Cigars    Start date: 68    Last attempt to quit: 08/18/2018    Years since quitting: 1.4  .  Smokeless tobacco: Never Used  . Tobacco comment: Started at age 75 - 1ppdx 5, quit x 5; pipex5 years, cigarsx15  Substance and Sexual Activity  . Alcohol use: Yes    Comment: 1-2 oz per month   . Drug use: Not Currently  . Sexual activity: Yes  Other Topics Concern  . Not on file  Social History Narrative  . Not on file   Social Determinants of Health   Financial Resource Strain: Low Risk   . Difficulty of Paying Living Expenses: Not hard at all  Food Insecurity: No Food Insecurity  . Worried About Charity fundraiser in the Last Year: Never true  . Ran Out of Food in the Last Year: Never true  Transportation Needs: No Transportation Needs  . Lack of Transportation (Medical): No  . Lack of Transportation (Non-Medical): No  Physical Activity: Inactive  . Days of Exercise per Week: 0 days  . Minutes of Exercise per Session: 0 min  Stress: No Stress Concern Present  . Feeling of Stress : Not at all  Social Connections:   . Frequency of Communication with Friends and Family:   . Frequency of Social Gatherings with Friends and Family:   . Attends Religious Services:   . Active Member of Clubs or Organizations:   . Attends Archivist Meetings:   Marland Kitchen Marital Status:     Outpatient Encounter Medications as of 01/28/2020  Medication Sig  . amLODipine (NORVASC) 10 MG tablet Take 10 mg by mouth daily.  Marland Kitchen ammonium lactate (LAC-HYDRIN) 12 % lotion Apply 1 application topically 2 (two) times daily.  Marland Kitchen apixaban (ELIQUIS) 5 MG TABS tablet Take 1 tablet by mouth 2 times a day. Need to schedule appt with cardiologist for refills.  Marland Kitchen atorvastatin (LIPITOR) 10 MG tablet Take 20 mg by mouth at bedtime.  . cholecalciferol (VITAMIN D3) 10 MCG (400 UNIT) TABS tablet Take 400 Units by mouth.  . diclofenac sodium (VOLTAREN) 1 % GEL Apply 2 g topically 3 (three) times daily as needed.  . diltiazem (TIAZAC) 180 MG 24 hr capsule Take 180 mg by mouth 2 (two) times daily.  . ferrous sulfate 325  (65 FE) MG tablet Take 325 mg by mouth daily with breakfast.  . potassium chloride SA (K-DUR,KLOR-CON) 20 MEQ tablet Take 40 mEq by mouth 2 (two) times daily. Morning and evening  . triamterene-hydrochlorothiazide (MAXZIDE) 75-50 MG per tablet Take 0.5 tablets by mouth every evening.    No facility-administered encounter medications on file as of 01/28/2020.    Activities of Daily Living In your present state of health, do you have any difficulty performing the following activities: 01/28/2020  Hearing? N  Comment if speaking to low or  fast  Vision? N  Difficulty concentrating or making decisions? N  Walking or climbing stairs? N  Dressing or bathing? N  Doing errands, shopping? N  Preparing Food and eating ? N  Using the Toilet? N  In the past six months, have you accidently leaked urine? N  Do you have problems with loss of bowel control? N  Managing your Medications? N  Managing your Finances? N  Housekeeping or managing your Housekeeping? N  Some recent data might be hidden    Patient Care Team: [name], MD as PCP - General (Internal Medicine) Quintella Reichert, MD as PCP - Cardiology (Cardiology)   Assessment:   This is a routine wellness examination for [place].  Exercise Activities and Dietary recommendations Current Exercise Habits: The patient does not participate in regular exercise at present  Goals    . Patient Stated     01/28/2020, no goals    . Quit Smoking       Fall Risk Fall Risk  01/28/2020 05/01/2019 01/16/2019 10/10/2018 09/26/2018  Falls in the past year? 1 0 0 0 0  Comment lost balance - - - -  Number falls in past yr: 0 - - - -  Injury with Fall? 0 - - - -  Risk for fall due to : Medication side effect - Medication side effect - -  Follow up Falls evaluation completed;Education provided;Falls prevention discussed - Falls prevention discussed - -   Is the patient's home free of loose throw rugs in walkways, pet beds, electrical cords, etc?   yes       Grab bars in the bathroom? yes      Handrails on the stairs?   n/a      Adequate lighting?   yes  Timed Get Up and Go Performed: n/a  Depression Screen PHQ 2/9 Scores 01/28/2020 05/01/2019 01/16/2019 10/10/2018  PHQ - 2 Score 0 0 0 0  PHQ- 9 Score 0 - 0 -    Cognitive Function     6CIT Screen 01/28/2020 01/16/2019  What Year? 0 points 0 points  What month? 0 points 0 points  What time? 0 points 3 points  Count back from 20 0 points 0 points  Months in reverse 0 points 0 points  Repeat phrase 0 points 0 points  Total Score 0 3    Immunization History  Administered Date(s) Administered  . Influenza, High Dose Seasonal PF 05/01/2019  . Influenza-Unspecified 06/21/2018  . PFIZER SARS-COV-2 Vaccination 09/21/2019, 10/12/2019  . Pneumococcal Polysaccharide-23 01/31/2017  . Tdap 07/25/2010    Qualifies for Shingles Vaccine? yes  Screening Tests Health Maintenance  Topic Date Due  . FOOT EXAM  Never done  . OPHTHALMOLOGY EXAM  Never done  . COLONOSCOPY  01/27/2021 (Originally 04/30/2018)  . INFLUENZA VACCINE  03/21/2020  . HEMOGLOBIN A1C  04/30/2020  . URINE MICROALBUMIN  04/30/2020  . TETANUS/TDAP  07/25/2020  . COVID-19 Vaccine  Completed  . Hepatitis C Screening  Completed  . PNA vac Low Risk Adult  Completed   Cancer Screenings: Lung: Low Dose CT Chest recommended if Age 39-80 years, 30 pack-year currently smoking OR have quit w/in 15years. Patient does not qualify. Colorectal: will get copy from [place]  Additional Screenings:  Hepatitis C Screening:09/02/2012      Plan:    Patient has no goals set at this time.  I have personally reviewed and noted the following in the patient's chart:   . Medical and social history .  Use of alcohol, tobacco or illicit drugs  . Current medications and supplements . Functional ability and status . Nutritional status . Physical activity . Advanced directives . List of other physicians . Hospitalizations, surgeries, and ER visits in  previous 12 months . Vitals . Screenings to include cognitive, depression, and falls . Referrals and appointments  In addition, I have reviewed and discussed with patient certain preventive protocols, quality metrics, and best practice recommendations. A written personalized care plan for preventive services as well as general preventive health recommendations were provided to patient.     Barb Merino, LPN  08/26/[id number]

## 2020-02-05 ENCOUNTER — Telehealth: Payer: Self-pay

## 2020-02-05 ENCOUNTER — Other Ambulatory Visit: Payer: Self-pay

## 2020-02-05 ENCOUNTER — Other Ambulatory Visit: Payer: Medicare PPO | Admitting: *Deleted

## 2020-02-05 DIAGNOSIS — I700 Atherosclerosis of aorta: Secondary | ICD-10-CM | POA: Diagnosis not present

## 2020-02-05 DIAGNOSIS — I2510 Atherosclerotic heart disease of native coronary artery without angina pectoris: Secondary | ICD-10-CM

## 2020-02-05 DIAGNOSIS — E785 Hyperlipidemia, unspecified: Secondary | ICD-10-CM

## 2020-02-05 LAB — LIPID PANEL
Chol/HDL Ratio: 3.2 ratio (ref 0.0–5.0)
Cholesterol, Total: 152 mg/dL (ref 100–199)
HDL: 48 mg/dL (ref >39)
LDL Chol Calc (NIH): 89 mg/dL (ref 0–99)
Triglycerides: 80 mg/dL (ref 0–149)
VLDL Cholesterol Cal: 15 mg/dL (ref 5–40)

## 2020-02-05 LAB — ALT: ALT: 12 IU/L (ref 0–44)

## 2020-02-05 MED ORDER — ATORVASTATIN CALCIUM 40 MG PO TABS
40.0000 mg | ORAL_TABLET | Freq: Every day | ORAL | 3 refills | Status: DC
Start: 2020-02-05 — End: 2020-03-24

## 2020-02-05 NOTE — Telephone Encounter (Signed)
-----   Message from Quintella Reichert, MD sent at 02/05/2020  4:50 PM EDT ----- LDL goal < 70 - increase atorvastatin to 40mg  daily and repeat FLP and ALT in 6 weeks

## 2020-03-01 ENCOUNTER — Encounter: Payer: Self-pay | Admitting: Internal Medicine

## 2020-03-01 ENCOUNTER — Ambulatory Visit (INDEPENDENT_AMBULATORY_CARE_PROVIDER_SITE_OTHER): Payer: Medicare PPO | Admitting: Internal Medicine

## 2020-03-01 ENCOUNTER — Other Ambulatory Visit: Payer: Self-pay

## 2020-03-01 VITALS — BP 122/78 | HR 61 | Temp 97.8°F | Ht 69.8 in | Wt 220.2 lb

## 2020-03-01 DIAGNOSIS — E6609 Other obesity due to excess calories: Secondary | ICD-10-CM

## 2020-03-01 DIAGNOSIS — Z6831 Body mass index (BMI) 31.0-31.9, adult: Secondary | ICD-10-CM

## 2020-03-01 DIAGNOSIS — I480 Paroxysmal atrial fibrillation: Secondary | ICD-10-CM | POA: Diagnosis not present

## 2020-03-01 DIAGNOSIS — E7800 Pure hypercholesterolemia, unspecified: Secondary | ICD-10-CM

## 2020-03-01 DIAGNOSIS — I119 Hypertensive heart disease without heart failure: Secondary | ICD-10-CM | POA: Diagnosis not present

## 2020-03-01 LAB — LIPID PANEL
Chol/HDL Ratio: 3.5 ratio (ref 0.0–5.0)
Cholesterol, Total: 156 mg/dL (ref 100–199)
HDL: 45 mg/dL (ref >39)
LDL Chol Calc (NIH): 96 mg/dL (ref 0–99)
Triglycerides: 76 mg/dL (ref 0–149)
VLDL Cholesterol Cal: 15 mg/dL (ref 5–40)

## 2020-03-01 NOTE — Patient Instructions (Signed)
Mediterranean Diet A Mediterranean diet refers to food and lifestyle choices that are based on the traditions of countries located on the [place]. This way of eating has been shown to help prevent certain conditions and improve outcomes for people who have chronic diseases, like kidney disease and heart disease. What are tips for following this plan? Lifestyle  Cook and eat meals together with your family, when possible.  Drink enough fluid to keep your urine clear or pale yellow.  Be physically active every day. This includes: ? Aerobic exercise like running or swimming. ? Leisure activities like gardening, walking, or housework.  Get 7-8 hours of sleep each night.  If recommended by your health care provider, drink red wine in moderation. This means 1 glass a day for nonpregnant women and 2 glasses a day for men. A glass of wine equals 5 oz (150 mL). Reading food labels  Check the serving size of packaged foods. For foods such as rice and pasta, the serving size refers to the amount of cooked product, not dry.  Check the total fat in packaged foods. Avoid foods that have saturated fat or trans fats.  Check the ingredients list for added sugars, such as corn syrup.   Shopping  At the grocery store, buy most of your food from the areas near the walls of the store. This includes: ? Fresh fruits and vegetables (produce). ? Grains, beans, nuts, and seeds. Some of these may be available in unpackaged forms or large amounts (in bulk). ? Fresh seafood. ? Poultry and eggs. ? Low-fat dairy products.  Buy whole ingredients instead of prepackaged foods.  Buy fresh fruits and vegetables in-season from local farmers markets.  Buy frozen fruits and vegetables in resealable bags.  If you do not have access to quality fresh seafood, buy precooked frozen shrimp or canned fish, such as tuna, salmon, or sardines.  Buy small amounts of raw or cooked vegetables, salads, or olives from  the deli or salad bar at your store.  Stock your pantry so you always have certain foods on hand, such as olive oil, canned tuna, canned tomatoes, rice, pasta, and beans. Cooking  Cook foods with extra-virgin olive oil instead of using butter or other vegetable oils.  Have meat as a side dish, and have vegetables or grains as your main dish. This means having meat in small portions or adding small amounts of meat to foods like pasta or stew.  Use beans or vegetables instead of meat in common dishes like chili or lasagna.  Experiment with different cooking methods. Try roasting or broiling vegetables instead of steaming or sauteing them.  Add frozen vegetables to soups, stews, pasta, or rice.  Add nuts or seeds for added healthy fat at each meal. You can add these to yogurt, salads, or vegetable dishes.  Marinate fish or vegetables using olive oil, lemon juice, garlic, and fresh herbs. Meal planning  Plan to eat 1 vegetarian meal one day each week. Try to work up to 2 vegetarian meals, if possible.  Eat seafood 2 or more times a week.  Have healthy snacks readily available, such as: ? Vegetable sticks with hummus. ? Greek yogurt. ? Fruit and nut trail mix.  Eat balanced meals throughout the week. This includes: ? Fruit: 2-3 servings a day ? Vegetables: 4-5 servings a day ? Low-fat dairy: 2 servings a day ? Fish, poultry, or lean meat: 1 serving a day ? Beans and legumes: 2 or more servings a week ?   Nuts and seeds: 1-2 servings a day ? Whole grains: 6-8 servings a day ? Extra-virgin olive oil: 3-4 servings a day  Limit red meat and sweets to only a few servings a month   What are my food choices?  Mediterranean diet ? Recommended  Grains: Whole-grain pasta. Brown rice. Bulgar wheat. Polenta. Couscous. Whole-wheat bread. Oatmeal. Quinoa.  Vegetables: Artichokes. Beets. Broccoli. Cabbage. Carrots. Eggplant. Green beans. Chard. Kale. Spinach. Onions. Leeks. Peas. Squash.  Tomatoes. Peppers. Radishes.  Fruits: Apples. Apricots. Avocado. Berries. Bananas. Cherries. Dates. Figs. Grapes. Lemons. Melon. Oranges. Peaches. Plums. Pomegranate.  Meats and other protein foods: Beans. Almonds. Sunflower seeds. Pine nuts. Peanuts. Cod. Salmon. Scallops. Shrimp. Tuna. Tilapia. Clams. Oysters. Eggs.  Dairy: Low-fat milk. Cheese. Greek yogurt.  Beverages: Water. Red wine. Herbal tea.  Fats and oils: Extra virgin olive oil. Avocado oil. Grape seed oil.  Sweets and desserts: Greek yogurt with honey. Baked apples. Poached pears. Trail mix.  Seasoning and other foods: Basil. Cilantro. Coriander. Cumin. Mint. Parsley. Sage. Rosemary. Tarragon. Garlic. Oregano. Thyme. Pepper. Balsalmic vinegar. Tahini. Hummus. Tomato sauce. Olives. Mushrooms. ? Limit these  Grains: Prepackaged pasta or rice dishes. Prepackaged cereal with added sugar.  Vegetables: Deep fried potatoes (french fries).  Fruits: Fruit canned in syrup.  Meats and other protein foods: Beef. Pork. Lamb. Poultry with skin. Hot dogs. Bacon.  Dairy: Ice cream. Sour cream. Whole milk.  Beverages: Juice. Sugar-sweetened soft drinks. Beer. Liquor and spirits.  Fats and oils: Butter. Canola oil. Vegetable oil. Beef fat (tallow). Lard.  Sweets and desserts: Cookies. Cakes. Pies. Candy.  Seasoning and other foods: Mayonnaise. Premade sauces and marinades. The items listed may not be a complete list. Talk with your dietitian about what dietary choices are right for you. Summary  The Mediterranean diet includes both food and lifestyle choices.  Eat a variety of fresh fruits and vegetables, beans, nuts, seeds, and whole grains.  Limit the amount of red meat and sweets that you eat.  Talk with your health care provider about whether it is safe for you to drink red wine in moderation. This means 1 glass a day for nonpregnant women and 2 glasses a day for men. A glass of wine equals 5 oz (150 mL). This information  is not intended to replace advice given to you by your health care provider. Make sure you discuss any questions you have with your health care provider. Document Revised: 04/06/2016 Document Reviewed: 03/30/2016 Elsevier Patient Education  2020 [employer] Inc.  

## 2020-03-01 NOTE — Progress Notes (Signed)
This visit occurred during the SARS-CoV-2 public health emergency.  Safety protocols were in place, including screening questions prior to the visit, additional usage of staff PPE, and extensive cleaning of exam room while observing appropriate contact time as indicated for disinfecting solutions.  Subjective:     Patient ID: Richard Sampson , male    DOB: Mar 23, 1946 , 74 y.o.   MRN: [id number]   Chief Complaint  Patient presents with  . Hypertension  . Hyperlipidemia    HPI  He is here today for BP/chol check. He states his cardiologist increased the dose of atorvastatin. He has yet to start 40mg  dose. He has been taking 2 tablets of 10mg  dose. He has not had any issues with the medication.   Hypertension This is a chronic problem. The current episode started more than 1 year ago. The problem has been gradually improving since onset. The problem is controlled. Pertinent negatives include no blurred vision, chest pain, palpitations or shortness of breath. The current treatment provides moderate improvement. Compliance problems include exercise.      Past Medical History:  Diagnosis Date  . A-fib ([facility]) 07/17/2014  . High cholesterol   . Hypertension   . Pneumonia      Family History  Problem Relation Age of Onset  . Thyroid nodules Mother   . Dementia Mother   . Heart failure Father   . Cancer Father   . Kidney disease Son   . Diabetes Brother   . Cancer Brother   . Heart attack Neg Hx   . Stroke Neg Hx      Current Outpatient Medications:  .  amLODipine (NORVASC) 10 MG tablet, Take 10 mg by mouth daily., Disp: , Rfl:  .  ammonium lactate (LAC-HYDRIN) 12 % lotion, Apply 1 application topically 2 (two) times daily., Disp: , Rfl:  .  apixaban (ELIQUIS) 5 MG TABS tablet, Take 1 tablet by mouth 2 times a day. Need to schedule appt with cardiologist for refills., Disp: 60 tablet, Rfl: 0 .  atorvastatin (LIPITOR) 40 MG tablet, Take 1 tablet (40 mg total) by mouth daily.,  Disp: 90 tablet, Rfl: 3 .  cholecalciferol (VITAMIN D3) 10 MCG (400 UNIT) TABS tablet, Take 400 Units by mouth., Disp: , Rfl:  .  diclofenac sodium (VOLTAREN) 1 % GEL, Apply 2 g topically 3 (three) times daily as needed., Disp: , Rfl:  .  diltiazem (TIAZAC) 180 MG 24 hr capsule, Take 180 mg by mouth 2 (two) times daily., Disp: , Rfl: 0 .  ferrous sulfate 325 (65 FE) MG tablet, Take 325 mg by mouth daily with breakfast., Disp: , Rfl:  .  potassium chloride SA (K-DUR,KLOR-CON) 20 MEQ tablet, Take 40 mEq by mouth 2 (two) times daily. Morning and evening, Disp: , Rfl:  .  triamterene-hydrochlorothiazide (MAXZIDE) 75-50 MG per tablet, Take 0.5 tablets by mouth every evening. , Disp: , Rfl:    Allergies  Allergen Reactions  . Ace Inhibitors Swelling    Throat swelling; pt was on lisinopril  . Angiotensin Receptor Blockers Swelling    Cross reactivity in known ACE I allergy  . Lisinopril Swelling    Throat swelling  . Losartan Other (See Comments)    Doctor told pt not to take     Review of Systems  Constitutional: Negative.   Eyes: Negative for blurred vision.  Respiratory: Negative.  Negative for shortness of breath.   Cardiovascular: Negative.  Negative for chest pain and palpitations.  Gastrointestinal: Negative.  Musculoskeletal: Positive for myalgias.       He c/o r thigh pain. States his sx started about 3-4 weeks ago. He denies fall/trauma. He is not sure what may have triggered his sx. He reports it feels as if a pin is sticking him in his thigh. He is sure that it is not a muscle cramp.  It only occurs at rest. Does not occur with exertion.   Neurological: Negative.   Psychiatric/Behavioral: Negative.      Today's Vitals   03/01/20 0929  BP: 122/78  Pulse: 61  Temp: 97.8 F (36.6 C)  TempSrc: Oral  Weight: 220 lb 3.2 oz (99.9 kg)  Height: 5' 9.8" (1.773 m)  PainSc: 0-No pain   Body mass index is 31.78 kg/m.   Objective:  Physical Exam Vitals and nursing note  reviewed.  Constitutional:      Appearance: Normal appearance.  Cardiovascular:     Rate and Rhythm: Normal rate and regular rhythm.     Heart sounds: Normal heart sounds.  Pulmonary:     Effort: Pulmonary effort is normal.     Breath sounds: Normal breath sounds.  Musculoskeletal:     Comments: Right thigh palpated - no tenderness. He is fully clothed, so area in question is not examined.   Skin:    General: Skin is warm.  Neurological:     General: No focal deficit present.     Mental Status: He is alert.  Psychiatric:        Mood and Affect: Mood normal.         Assessment And Plan:     1. Hypertensive heart disease without heart failure  Chronic, well controlled. He will continue with current meds. He is encouraged to avoid adding salt to his foods.   2. Paroxysmal atrial fibrillation (HCC)  Chronic, he is rate controlled today and in sinus rhythm.  He is properly anticoagulated.   3. Pure hypercholesterolemia  He is currently taking atorvastatin 20mg  (two 10mg  tabs) daily. He has yet to start atorvastatin 40mg  as per Cardiology.   - Lipid panel - Liver Profile  4. Class 1 obesity due to excess calories with serious comorbidity and body mass index (BMI) of 31.0 to 31.9 in adult  He is encouraged to strive for BMI less than 28 to decrease cardiac risk. He is advised to aim for at least 150 minutes of exercise weekly.    , MD   I, , MD, have reviewed all documentation for this visit. The documentation on 03/01/20 for the exam, diagnosis, procedures, and orders are all accurate and complete.  THE PATIENT IS ENCOURAGED TO PRACTICE SOCIAL DISTANCING DUE TO THE COVID-19 PANDEMIC.

## 2020-03-02 LAB — HEPATIC FUNCTION PANEL
ALT: 12 IU/L (ref 0–44)
AST: 13 IU/L (ref 0–40)
Albumin: 4.4 g/dL (ref 3.7–4.7)
Alkaline Phosphatase: 85 IU/L (ref 48–121)
Bilirubin Total: 0.3 mg/dL (ref 0.0–1.2)
Bilirubin, Direct: 0.08 mg/dL (ref 0.00–0.40)
Total Protein: 6.9 g/dL (ref 6.0–8.5)

## 2020-03-19 ENCOUNTER — Other Ambulatory Visit: Payer: Self-pay

## 2020-03-19 ENCOUNTER — Other Ambulatory Visit: Payer: Medicare PPO | Admitting: *Deleted

## 2020-03-19 DIAGNOSIS — E785 Hyperlipidemia, unspecified: Secondary | ICD-10-CM

## 2020-03-19 LAB — ALT: ALT: 16 IU/L (ref 0–44)

## 2020-03-19 LAB — LIPID PANEL
Chol/HDL Ratio: 2.9 ratio (ref 0.0–5.0)
Cholesterol, Total: 142 mg/dL (ref 100–199)
HDL: 49 mg/dL (ref >39)
LDL Chol Calc (NIH): 76 mg/dL (ref 0–99)
Triglycerides: 87 mg/dL (ref 0–149)
VLDL Cholesterol Cal: 17 mg/dL (ref 5–40)

## 2020-03-24 ENCOUNTER — Telehealth: Payer: Self-pay

## 2020-03-24 DIAGNOSIS — E785 Hyperlipidemia, unspecified: Secondary | ICD-10-CM

## 2020-03-24 MED ORDER — ATORVASTATIN CALCIUM 80 MG PO TABS
80.0000 mg | ORAL_TABLET | Freq: Every day | ORAL | 3 refills | Status: DC
Start: 2020-03-24 — End: 2022-05-25

## 2020-03-24 NOTE — Telephone Encounter (Signed)
The patient has been notified of the result and verbalized understanding.  All questions (if any) were answered. Theresia Majors, RN 03/24/2020 1:54 PM  Patient will increase atorvastatin to 80 mg daily he will repeat lab work 09/16.

## 2020-03-24 NOTE — Telephone Encounter (Signed)
-----   Message from Quintella Reichert, MD sent at 03/19/2020  6:27 PM EDT ----- LDL too high - increase atorvastatin to 80mg  daily and repeat FLP and ALT in 6 weeks

## 2020-03-31 ENCOUNTER — Encounter: Payer: Self-pay | Admitting: Internal Medicine

## 2020-05-03 ENCOUNTER — Encounter: Payer: Self-pay | Admitting: Internal Medicine

## 2020-05-03 ENCOUNTER — Other Ambulatory Visit: Payer: Self-pay | Admitting: Internal Medicine

## 2020-05-03 ENCOUNTER — Other Ambulatory Visit: Payer: Self-pay

## 2020-05-03 ENCOUNTER — Ambulatory Visit (INDEPENDENT_AMBULATORY_CARE_PROVIDER_SITE_OTHER): Payer: Medicare PPO | Admitting: Internal Medicine

## 2020-05-03 VITALS — BP 124/82 | HR 84 | Temp 98.2°F | Ht 69.8 in | Wt 219.0 lb

## 2020-05-03 DIAGNOSIS — Z0000 Encounter for general adult medical examination without abnormal findings: Secondary | ICD-10-CM

## 2020-05-03 DIAGNOSIS — I119 Hypertensive heart disease without heart failure: Secondary | ICD-10-CM | POA: Diagnosis not present

## 2020-05-03 DIAGNOSIS — M25561 Pain in right knee: Secondary | ICD-10-CM | POA: Diagnosis not present

## 2020-05-03 DIAGNOSIS — R7309 Other abnormal glucose: Secondary | ICD-10-CM

## 2020-05-03 DIAGNOSIS — E559 Vitamin D deficiency, unspecified: Secondary | ICD-10-CM

## 2020-05-03 DIAGNOSIS — Z6831 Body mass index (BMI) 31.0-31.9, adult: Secondary | ICD-10-CM

## 2020-05-03 DIAGNOSIS — R351 Nocturia: Secondary | ICD-10-CM | POA: Diagnosis not present

## 2020-05-03 DIAGNOSIS — F17200 Nicotine dependence, unspecified, uncomplicated: Secondary | ICD-10-CM

## 2020-05-03 DIAGNOSIS — I480 Paroxysmal atrial fibrillation: Secondary | ICD-10-CM

## 2020-05-03 DIAGNOSIS — E6609 Other obesity due to excess calories: Secondary | ICD-10-CM

## 2020-05-03 LAB — POCT URINALYSIS DIPSTICK
Bilirubin, UA: NEGATIVE
Blood, UA: NEGATIVE
Glucose, UA: NEGATIVE
Ketones, UA: NEGATIVE
Leukocytes, UA: NEGATIVE
Nitrite, UA: NEGATIVE
Protein, UA: NEGATIVE
Spec Grav, UA: 1.02
Urobilinogen, UA: 0.2 U/dL
pH, UA: 6.5

## 2020-05-03 LAB — POCT UA - MICROALBUMIN
Albumin/Creatinine Ratio, Urine, POC: <30
Creatinine, POC: 300 mg/dL
Microalbumin Ur, POC: 30 mg/L

## 2020-05-03 NOTE — Progress Notes (Signed)
I,[name],acting as a Education administrator for [name], MD.,have documented all relevant documentation on the behalf of [name], MD,as directed by  [name], MD while in the presence of [name], MD.  This visit occurred during the SARS-CoV-2 public health emergency.  Safety protocols were in place, including screening questions prior to the visit, additional usage of staff PPE, and extensive cleaning of exam room while observing appropriate contact time as indicated for disinfecting solutions.  Subjective:     Patient ID: Richard Sampson , male    DOB: 1946/06/25 , 74 y.o.   MRN: [id number]   Chief Complaint  Patient presents with  . Annual Exam  . Hypertension    HPI  He is here today for a full physical exam. He reports he has prostate exams performed at the [place]. He has no specific concerns or complaints at this time.   Hypertension This is a chronic problem. The current episode started more than 1 year ago. The problem has been gradually improving since onset. The problem is controlled. Pertinent negatives include no blurred vision, chest pain, palpitations or shortness of breath. Risk factors for coronary artery disease include dyslipidemia, obesity, male gender and sedentary lifestyle. Past treatments include calcium channel blockers and diuretics. The current treatment provides moderate improvement. Compliance problems include exercise.      Past Medical History:  Diagnosis Date  . A-fib ([place]) 07/17/2014  . High cholesterol   . Hypertension   . Pneumonia      Family History  Problem Relation Age of Onset  . Thyroid nodules Mother   . Dementia Mother   . Heart failure Father   . Cancer Father   . Kidney disease Son   . Diabetes Brother   . Cancer Brother   . Heart attack Neg Hx   . Stroke Neg Hx      Current Outpatient Medications:  .  amLODipine (NORVASC) 10 MG tablet, Take 10 mg by mouth daily., Disp: , Rfl:  .  ammonium lactate  (LAC-HYDRIN) 12 % lotion, Apply 1 application topically 2 (two) times daily., Disp: , Rfl:  .  apixaban (ELIQUIS) 5 MG TABS tablet, Take 1 tablet by mouth 2 times a day. Need to schedule appt with cardiologist for refills., Disp: 60 tablet, Rfl: 0 .  atorvastatin (LIPITOR) 80 MG tablet, Take 1 tablet (80 mg total) by mouth daily., Disp: 90 tablet, Rfl: 3 .  cholecalciferol (VITAMIN D3) 10 MCG (400 UNIT) TABS tablet, Take 400 Units by mouth., Disp: , Rfl:  .  diclofenac sodium (VOLTAREN) 1 % GEL, Apply 2 g topically 3 (three) times daily as needed., Disp: , Rfl:  .  diltiazem (TIAZAC) 180 MG 24 hr capsule, Take 180 mg by mouth 2 (two) times daily., Disp: , Rfl: 0 .  ferrous sulfate 325 (65 FE) MG tablet, Take 325 mg by mouth daily with breakfast., Disp: , Rfl:  .  potassium chloride SA (K-DUR,KLOR-CON) 20 MEQ tablet, Take 40 mEq by mouth 2 (two) times daily. Morning and evening, Disp: , Rfl:  .  triamterene-hydrochlorothiazide (MAXZIDE) 75-50 MG per tablet, Take 0.5 tablets by mouth every evening. , Disp: , Rfl:    Allergies  Allergen Reactions  . Ace Inhibitors Swelling    Throat swelling; pt was on lisinopril  . Angiotensin Receptor Blockers Swelling    Cross reactivity in known ACE I allergy  . Lisinopril Swelling    Throat swelling  . Losartan Other (See Comments)  Doctor told pt not to take     Men's preventive visit. Patient Health Questionnaire (PHQ-2) is    Clinical Support from 01/28/2020 in [facility]  PHQ-2 Total Score 0    . Patient is on a healthy diet. Marital status: Married. Relevant history for alcohol use is:  Social History   Substance and Sexual Activity  Alcohol Use Yes   Comment: 1-2 oz per month   . Relevant history for tobacco use is:  Social History   Tobacco Use  Smoking Status Current Every Day Smoker  . Packs/day: 0.25  . Years: 20.00  . Pack years: 5.00  . Types: Cigars  . Start date: 39  . Last attempt to quit:  08/18/2018  . Years since quitting: 1.7  Smokeless Tobacco Never Used  Tobacco Comment   Started at age 48 - 1ppdx 22, quit x 5; pipex5 years, cigarsx15  .   Review of Systems  Constitutional: Negative.   HENT: Negative.   Eyes: Negative.  Negative for blurred vision.  Respiratory: Negative.  Negative for shortness of breath.   Cardiovascular: Negative.  Negative for chest pain and palpitations.  Gastrointestinal: Negative.   Endocrine: Negative.   Genitourinary: Negative.   Musculoskeletal: Positive for arthralgias.       He c/o right knee pain. There is some discomfort with ambulation. Has stiffness upon awakening. Seems to improve as the day progresses.   Skin: Negative.   Allergic/Immunologic: Negative.   Neurological: Negative.   Hematological: Negative.   Psychiatric/Behavioral: Negative.      Today's Vitals   05/03/20 0954  BP: 124/82  Pulse: 84  Temp: 98.2 F (36.8 C)  TempSrc: Oral  Weight: 219 lb (99.3 kg)  Height: 5' 9.8" (1.773 m)  PainSc: 0-No pain   Body mass index is 31.6 kg/m.  Wt Readings from Last 3 Encounters:  05/03/20 219 lb (99.3 kg)  03/01/20 220 lb 3.2 oz (99.9 kg)  01/28/20 221 lb 6.4 oz (100.4 kg)   Objective:  Physical Exam Vitals and nursing note reviewed.  Constitutional:      Appearance: Normal appearance. He is obese.  HENT:     Head: Normocephalic and atraumatic.     Right Ear: Tympanic membrane, ear canal and external ear normal.     Left Ear: Tympanic membrane, ear canal and external ear normal.     Nose:     Comments: Deferred, masked    Mouth/Throat:     Comments: Deferred, masked Eyes:     Extraocular Movements: Extraocular movements intact.     Conjunctiva/sclera: Conjunctivae normal.     Pupils: Pupils are equal, round, and reactive to light.  Cardiovascular:     Rate and Rhythm: Normal rate and regular rhythm.     Pulses: Normal pulses.     Heart sounds: Normal heart sounds.  Pulmonary:     Effort: Pulmonary  effort is normal.     Breath sounds: Normal breath sounds.  Chest:     Breasts:        Right: Normal. No swelling, bleeding, inverted nipple, mass or nipple discharge.        Left: Normal. No swelling, bleeding, inverted nipple, mass or nipple discharge.  Abdominal:     General: Bowel sounds are normal.     Palpations: Abdomen is soft.  Genitourinary:    Comments: Deferred, as per patient Musculoskeletal:        General: Tenderness present. Normal range of motion.  Cervical back: Normal range of motion and neck supple.     Comments: Crepitus r knee. No overlying erythema  Skin:    General: Skin is warm.  Neurological:     General: No focal deficit present.     Mental Status: He is alert and oriented to person, place, and time.  Psychiatric:        Mood and Affect: Mood normal.        Behavior: Behavior normal.         Assessment And Plan:    1. Routine general medical examination at health care facility Comments: A full exam was performed. DRE deferred, as per patient. PATIENT IS ADVISED TO GET 30-45 MINUTES REGULAR EXERCISE NO LESS THAN FOUR TO FIVE DAYS PER WEEK - BOTH WEIGHTBEARING EXERCISES AND AEROBIC ARE RECOMMENDED.  PATIENT IS ADVISED TO FOLLOW A HEALTHY DIET WITH AT LEAST SIX FRUITS/VEGGIES PER DAY, DECREASE INTAKE OF RED MEAT, AND TO INCREASE FISH INTAKE TO TWO DAYS PER WEEK.  MEATS/FISH SHOULD NOT BE FRIED, BAKED OR BROILED IS PREFERABLE.  I SUGGEST WEARING SPF 50 SUNSCREEN ON EXPOSED PARTS AND ESPECIALLY WHEN IN THE DIRECT SUNLIGHT FOR AN EXTENDED PERIOD OF TIME.  PLEASE AVOID FAST FOOD RESTAURANTS AND INCREASE YOUR WATER INTAKE.   2. Hypertensive heart disease without heart failure Comments: Chronic, well controlled.  He will continue with current meds. EKG performed, NSR w/ LAFB and LAE. He will rto in six months for re-evaluation. Encouraged to avoid adding salt to his foods.  - EKG 12-Lead - CBC - BMP8+EGFR  3. Paroxysmal atrial fibrillation (HCC) Comments:  He is in sinus rhythm today. He is rate controlled and properly anticoagulated.   4. Acute pain of right knee Comments: He does not wish to have further eval at this time. Plans to discuss at his next [place] appt.   5. Other abnormal glucose Comments: HIs a1c has been elevated in the past. I will recheck this today. Encouraged to avoid sugary beverages, including diet.  - POCT Urinalysis Dipstick (81002) - POCT UA - Microalbumin - Hemoglobin A1c  6. Nocturia Comments: DRE declined. I will check PSA today.  - PSA  7. Vitamin D deficiency disease Comments: I will check a vitamin D level and supplement as needed.  - VITAMIN D 25 Hydroxy (Vit-D Deficiency, Fractures)  8. Tobacco use disorder  Smoking cessation instruction/counseling given:  counseled patient on the dangers of tobacco use, advised patient to stop smoking, and reviewed strategies to maximize success  9. Class 1 obesity due to excess calories with serious comorbidity and body mass index (BMI) of 31.0 to 31.9 in adult Comments: He is encouraged to strive for BMI less than 29 to decrease cardiac risk. Advised to aim for 150 minutes of exercise per week.      Patient was given opportunity to ask questions. Patient verbalized understanding of the plan and was able to repeat key elements of the plan. All questions were answered to their satisfaction.   [name], MD   I, [name], MD, have reviewed all documentation for this visit. The documentation on 05/03/20 for the exam, diagnosis, procedures, and orders are all accurate and complete.  THE PATIENT IS ENCOURAGED TO PRACTICE SOCIAL DISTANCING DUE TO THE COVID-19 PANDEMIC.

## 2020-05-03 NOTE — Patient Instructions (Signed)
Health Maintenance After Age 72 After age 72, you are at a higher risk for certain long-term diseases and infections as well as injuries from falls. Falls are a major cause of broken bones and head injuries in people who are older than age 72. Getting regular preventive care can help to keep you healthy and well. Preventive care includes getting regular testing and making lifestyle changes as recommended by your health care provider. Talk with your health care provider about:  Which screenings and tests you should have. A screening is a test that checks for a disease when you have no symptoms.  A diet and exercise plan that is right for you. What should I know about screenings and tests to prevent falls? Screening and testing are the best ways to find a health problem early. Early diagnosis and treatment give you the best chance of managing medical conditions that are common after age 72. Certain conditions and lifestyle choices may make you more likely to have a fall. Your health care provider may recommend:  Regular vision checks. Poor vision and conditions such as cataracts can make you more likely to have a fall. If you wear glasses, make sure to get your prescription updated if your vision changes.  Medicine review. Work with your health care provider to regularly review all of the medicines you are taking, including over-the-counter medicines. Ask your health care provider about any side effects that may make you more likely to have a fall. Tell your health care provider if any medicines that you take make you feel dizzy or sleepy.  Osteoporosis screening. Osteoporosis is a condition that causes the bones to get weaker. This can make the bones weak and cause them to break more easily.  Blood pressure screening. Blood pressure changes and medicines to control blood pressure can make you feel dizzy.  Strength and balance checks. Your health care provider may recommend certain tests to check your  strength and balance while standing, walking, or changing positions.  Foot health exam. Foot pain and numbness, as well as not wearing proper footwear, can make you more likely to have a fall.  Depression screening. You may be more likely to have a fall if you have a fear of falling, feel emotionally low, or feel unable to do activities that you used to do.  Alcohol use screening. Using too much alcohol can affect your balance and may make you more likely to have a fall. What actions can I take to lower my risk of falls? General instructions  Talk with your health care provider about your risks for falling. Tell your health care provider if: ? You fall. Be sure to tell your health care provider about all falls, even ones that seem minor. ? You feel dizzy, sleepy, or off-balance.  Take over-the-counter and prescription medicines only as told by your health care provider. These include any supplements.  Eat a healthy diet and maintain a healthy weight. A healthy diet includes low-fat dairy products, low-fat (lean) meats, and fiber from whole grains, beans, and lots of fruits and vegetables. Home safety  Remove any tripping hazards, such as rugs, cords, and clutter.  Install safety equipment such as grab bars in bathrooms and safety rails on stairs.  Keep rooms and walkways well-lit. Activity   Follow a regular exercise program to stay fit. This will help you maintain your balance. Ask your health care provider what types of exercise are appropriate for you.  If you need a cane or   walker, use it as recommended by your health care provider.  Wear supportive shoes that have nonskid soles. Lifestyle  Do not drink alcohol if your health care provider tells you not to drink.  If you drink alcohol, limit how much you have: ? 0-1 drink a day for women. ? 0-2 drinks a day for men.  Be aware of how much alcohol is in your drink. In the [place] one drink equals one typical bottle of beer (12  oz), one-half glass of wine (5 oz), or one shot of hard liquor (1 oz).  Do not use any products that contain nicotine or tobacco, such as cigarettes and e-cigarettes. If you need help quitting, ask your health care provider. Summary  Having a healthy lifestyle and getting preventive care can help to protect your health and wellness after age 72.  Screening and testing are the best way to find a health problem early and help you avoid having a fall. Early diagnosis and treatment give you the best chance for managing medical conditions that are more common for people who are older than age 72.  Falls are a major cause of broken bones and head injuries in people who are older than age 72. Take precautions to prevent a fall at home.  Work with your health care provider to learn what changes you can make to improve your health and wellness and to prevent falls. This information is not intended to replace advice given to you by your health care provider. Make sure you discuss any questions you have with your health care provider. Document Revised: 11/28/2018 Document Reviewed: 06/20/2017 Elsevier Patient Education  2020 [employer]  

## 2020-05-04 LAB — BMP8+EGFR
BUN/Creatinine Ratio: 12 (ref 10–24)
BUN: 15 mg/dL (ref 8–27)
CO2: 23 mmol/L (ref 20–29)
Calcium: 9.8 mg/dL (ref 8.6–10.2)
Chloride: 103 mmol/L (ref 96–106)
Creatinine, Ser: 1.3 mg/dL — ABNORMAL HIGH (ref 0.76–1.27)
GFR calc Af Amer: 63 mL/min/{1.73_m2} (ref >59)
GFR calc non Af Amer: 55 mL/min/{1.73_m2} — ABNORMAL LOW (ref >59)
Glucose: 87 mg/dL (ref 65–99)
Potassium: 3.7 mmol/L (ref 3.5–5.2)
Sodium: 141 mmol/L (ref 134–144)

## 2020-05-04 LAB — CBC
Hematocrit: 40.8 % (ref 37.5–51.0)
Hemoglobin: 13.8 g/dL (ref 13.0–17.7)
MCH: 30.7 pg (ref 26.6–33.0)
MCHC: 33.8 g/dL (ref 31.5–35.7)
MCV: 91 fL (ref 79–97)
Platelets: 227 10*3/uL (ref 150–450)
RBC: 4.5 x10E6/uL (ref 4.14–5.80)
RDW: 13.2 % (ref 11.6–15.4)
WBC: 4.1 10*3/uL (ref 3.4–10.8)

## 2020-05-04 LAB — HEMOGLOBIN A1C
Est. average glucose Bld gHb Est-mCnc: 111 mg/dL
Hgb A1c MFr Bld: 5.5 % (ref 4.8–5.6)

## 2020-05-04 LAB — VITAMIN D 25 HYDROXY (VIT D DEFICIENCY, FRACTURES): Vit D, 25-Hydroxy: 41.7 ng/mL (ref 30.0–100.0)

## 2020-05-04 LAB — PSA: Prostate Specific Ag, Serum: 1.4 ng/mL (ref 0.0–4.0)

## 2020-05-05 ENCOUNTER — Encounter: Payer: Self-pay | Admitting: Internal Medicine

## 2020-05-05 ENCOUNTER — Other Ambulatory Visit: Payer: Self-pay

## 2020-05-05 ENCOUNTER — Other Ambulatory Visit: Payer: Medicare PPO | Admitting: *Deleted

## 2020-05-05 DIAGNOSIS — E785 Hyperlipidemia, unspecified: Secondary | ICD-10-CM

## 2020-05-05 LAB — LIPID PANEL
Chol/HDL Ratio: 2.8 ratio (ref 0.0–5.0)
Cholesterol, Total: 131 mg/dL (ref 100–199)
HDL: 46 mg/dL (ref >39)
LDL Chol Calc (NIH): 70 mg/dL (ref 0–99)
Triglycerides: 73 mg/dL (ref 0–149)
VLDL Cholesterol Cal: 15 mg/dL (ref 5–40)

## 2020-05-05 LAB — ALT: ALT: 14 IU/L (ref 0–44)

## 2020-09-07 ENCOUNTER — Ambulatory Visit: Payer: Medicare PPO | Admitting: Internal Medicine

## 2020-09-21 ENCOUNTER — Emergency Department (HOSPITAL_BASED_OUTPATIENT_CLINIC_OR_DEPARTMENT_OTHER)
Admission: EM | Admit: 2020-09-21 | Discharge: 2020-09-21 | Disposition: A | Discharge status: Home / Self Care | Payer: Medicare PPO | Attending: Emergency Medicine | Admitting: Emergency Medicine

## 2020-09-21 ENCOUNTER — Encounter (HOSPITAL_BASED_OUTPATIENT_CLINIC_OR_DEPARTMENT_OTHER): Payer: Self-pay

## 2020-09-21 ENCOUNTER — Emergency Department (HOSPITAL_BASED_OUTPATIENT_CLINIC_OR_DEPARTMENT_OTHER): Payer: Medicare PPO

## 2020-09-21 ENCOUNTER — Other Ambulatory Visit: Payer: Self-pay

## 2020-09-21 DIAGNOSIS — R9431 Abnormal electrocardiogram [ECG] [EKG]: Secondary | ICD-10-CM | POA: Diagnosis not present

## 2020-09-21 DIAGNOSIS — Z79899 Other long term (current) drug therapy: Secondary | ICD-10-CM | POA: Insufficient documentation

## 2020-09-21 DIAGNOSIS — S29012A Strain of muscle and tendon of back wall of thorax, initial encounter: Secondary | ICD-10-CM | POA: Diagnosis not present

## 2020-09-21 DIAGNOSIS — E041 Nontoxic single thyroid nodule: Secondary | ICD-10-CM | POA: Diagnosis not present

## 2020-09-21 DIAGNOSIS — R079 Chest pain, unspecified: Secondary | ICD-10-CM | POA: Diagnosis not present

## 2020-09-21 DIAGNOSIS — X500XXA Overexertion from strenuous movement or load, initial encounter: Secondary | ICD-10-CM | POA: Diagnosis not present

## 2020-09-21 DIAGNOSIS — S3992XA Unspecified injury of lower back, initial encounter: Secondary | ICD-10-CM | POA: Diagnosis present

## 2020-09-21 DIAGNOSIS — Z7901 Long term (current) use of anticoagulants: Secondary | ICD-10-CM | POA: Diagnosis not present

## 2020-09-21 DIAGNOSIS — I712 Thoracic aortic aneurysm, without rupture: Secondary | ICD-10-CM | POA: Diagnosis not present

## 2020-09-21 DIAGNOSIS — F17290 Nicotine dependence, other tobacco product, uncomplicated: Secondary | ICD-10-CM | POA: Diagnosis not present

## 2020-09-21 DIAGNOSIS — I10 Essential (primary) hypertension: Secondary | ICD-10-CM | POA: Insufficient documentation

## 2020-09-21 DIAGNOSIS — T148XXA Other injury of unspecified body region, initial encounter: Secondary | ICD-10-CM

## 2020-09-21 LAB — CBC WITH DIFFERENTIAL/PLATELET
Abs Immature Granulocytes: 0.01 10*3/uL (ref 0.00–0.07)
Basophils Absolute: 0 10*3/uL (ref 0.0–0.1)
Basophils Relative: 1 %
Eosinophils Absolute: 0.1 10*3/uL (ref 0.0–0.5)
Eosinophils Relative: 3 %
HCT: 41.4 % (ref 39.0–52.0)
Hemoglobin: 13.6 g/dL (ref 13.0–17.0)
Immature Granulocytes: 0 %
Lymphocytes Relative: 31 %
Lymphs Abs: 1.2 10*3/uL (ref 0.7–4.0)
MCH: 30.4 pg (ref 26.0–34.0)
MCHC: 32.9 g/dL (ref 30.0–36.0)
MCV: 92.4 fL (ref 80.0–100.0)
Monocytes Absolute: 0.4 10*3/uL (ref 0.1–1.0)
Monocytes Relative: 11 %
Neutro Abs: 2.1 10*3/uL (ref 1.7–7.7)
Neutrophils Relative %: 54 %
Platelets: 222 10*3/uL (ref 150–400)
RBC: 4.48 MIL/uL (ref 4.22–5.81)
RDW: 14.3 % (ref 11.5–15.5)
WBC: 3.9 10*3/uL — ABNORMAL LOW (ref 4.0–10.5)
nRBC: 0 % (ref 0.0–0.2)

## 2020-09-21 LAB — COMPREHENSIVE METABOLIC PANEL
ALT: 18 U/L (ref 0–44)
AST: 18 U/L (ref 15–41)
Albumin: 3.9 g/dL (ref 3.5–5.0)
Alkaline Phosphatase: 65 U/L (ref 38–126)
Anion gap: 10 (ref 5–15)
BUN: 16 mg/dL (ref 8–23)
CO2: 26 mmol/L (ref 22–32)
Calcium: 9.6 mg/dL (ref 8.9–10.3)
Chloride: 102 mmol/L (ref 98–111)
Creatinine, Ser: 1.25 mg/dL — ABNORMAL HIGH (ref 0.61–1.24)
GFR, Estimated: >60 mL/min (ref >60)
Glucose, Bld: 126 mg/dL — ABNORMAL HIGH (ref 70–99)
Potassium: 3.5 mmol/L (ref 3.5–5.1)
Sodium: 138 mmol/L (ref 135–145)
Total Bilirubin: 0.3 mg/dL (ref 0.3–1.2)
Total Protein: 7.1 g/dL (ref 6.5–8.1)

## 2020-09-21 LAB — TROPONIN I (HIGH SENSITIVITY): Troponin I (High Sensitivity): 2 ng/L (ref <18)

## 2020-09-21 LAB — LIPASE, BLOOD: Lipase: 31 U/L (ref 11–51)

## 2020-09-21 MED ORDER — KETOROLAC TROMETHAMINE 30 MG/ML IJ SOLN
30.0000 mg | Freq: Once | INTRAMUSCULAR | Status: AC
  Administered 2020-09-21: 30 mg via INTRAVENOUS
  Filled 2020-09-21: qty 1

## 2020-09-21 MED ORDER — IOHEXOL 350 MG/ML SOLN
100.0000 mL | Freq: Once | INTRAVENOUS | Status: AC | PRN
  Administered 2020-09-21: 100 mL via INTRAVENOUS

## 2020-09-21 NOTE — ED Triage Notes (Addendum)
Pt c/o left upper back pain "that extends around to my chest cavity"-started yesterday-denies injury-worse with movement-denies fever/flu sx-NAD-steady gait

## 2020-09-21 NOTE — ED Provider Notes (Signed)
[facility] EMERGENCY DEPARTMENT Provider Note   CSN: [id number] Arrival date & time: 09/21/20  1435     History Chief Complaint  Patient presents with  . Back Pain    [name] is a 75 y.o. male with past medical history significant for atrial fibrillation on Eliquis, HLD, HTN, and known ascending thoracic aortic aneurysm who presents the ED with complaints of left upper back pain that extends around to chest, worse with exertion.  I reviewed patient's medical record and CT angio chest obtained 10/17/2015 demonstrated a small ascending thoracic aorta, 4.1 cm in diameter.  Recommended annual imaging with CTA or MRA.  No imaging since.  On my examination, patient reports that yesterday at approximately 10 AM he developed left-sided thoracic region back pain that radiated to his left side of chest.  He denies ever having had these symptoms previously.  He was unaware of his ascending thoracic aortic aneurysm and did not know that he needed to be having regular screenings.  He reports that he often will pick up his grandchildren and initially suspected that perhaps his symptoms were musculoskeletal as they were worse with lifting and other forms of exertion.  However, they continued to get progressively worse which is why his wife advised him today to come to the ED for evaluation.  He states that he has worsening symptoms with deep inspiration.  He has been taking his Eliquis, as directed.  He denies any cough or hemoptysis.  He also denies any difficulty breathing, unilateral extremity swelling or edema, abdominal pain, nausea or emesis, numbness or weakness, obvious precipitating trauma/injury, or other symptoms.  He is followed by [facility] heart care.  He admits to smoking 4 cigarillos per day.    HPI     Past Medical History:  Diagnosis Date  . A-fib (HCC) 07/17/2014  . High cholesterol   . Hypertension   . Pneumonia     Patient Active Problem List   Diagnosis Date  Noted  . Angioedema 10/20/2015  . GI bleed 10/20/2015  . GERD (gastroesophageal reflux disease) 10/20/2015  . 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 Tobacco abuse 10/18/2015  . Acute kidney injury (HCC) 07/18/2014  . PAF 2015 07/18/2014  . Chest pain with moderate risk of acute coronary syndrome 07/17/2014  . HLD (hyperlipidemia) 07/17/2014  . 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 Hypokalemia 03/16/2013  . Angio-edema-secondary to ACE 03/16/2013    Past Surgical History:  Procedure Laterality Date  . TONSILLECTOMY         Family History  Problem Relation Age of Onset  . Thyroid nodules Mother   . Dementia Mother   . Heart failure Father   . Cancer Father   . Kidney disease Son   . Diabetes Brother   . Cancer Brother   . Heart attack Neg Hx   . Stroke Neg Hx     Social History   Tobacco Use  . Smoking status: Current Every Day Smoker    Packs/day: 0.25    Years: 20.00    Pack years: 5.00    Types: Cigars    Start date: 74    Last attempt to quit: 08/18/2018    Years since quitting: 2.0  . Smokeless tobacco: Never Used  . Tobacco comment: Started at age 71 - 1ppdx 68, quit x 5; pipex5 years, cigarsx15  Vaping Use  . Vaping Use: Never used  Substance Use Topics  .  Alcohol use: Yes    Comment: 1-2 oz per month   . Drug use: Not Currently    Home Medications Prior to Admission medications   Medication Sig Start Date End Date Taking? Authorizing Provider  amLODipine (NORVASC) 10 MG tablet Take 10 mg by mouth daily.   Yes [provider]  apixaban (ELIQUIS) 5 MG TABS tablet Take 1 tablet by mouth 2 times a day. Need to schedule appt with cardiologist for refills. 12/28/16  Yes Duke Salvia, MD  atorvastatin (LIPITOR) 80 MG tablet Take 1 tablet (80 mg total) by mouth daily. 03/24/20  Yes Turner, Cornelious Bryant, MD  diltiazem (TIAZAC) 180 MG 24 hr capsule Take 180 mg by mouth 2 (two)  times daily. 08/23/15  Yes [provider]  ferrous sulfate 325 (65 FE) MG tablet Take 325 mg by mouth daily with breakfast.   Yes [provider]  potassium chloride SA (K-DUR,KLOR-CON) 20 MEQ tablet Take 40 mEq by mouth 2 (two) times daily. Morning and evening   Yes [provider]  triamterene-hydrochlorothiazide (MAXZIDE) 75-50 MG per tablet Take 0.5 tablets by mouth every evening.    Yes [provider]  ammonium lactate (LAC-HYDRIN) 12 % lotion Apply 1 application topically 2 (two) times daily.    [provider]  cholecalciferol (VITAMIN D3) 10 MCG (400 UNIT) TABS tablet Take 400 Units by mouth.    [provider]  diclofenac sodium (VOLTAREN) 1 % GEL Apply 2 g topically 3 (three) times daily as needed.    [provider]    Allergies    Ace inhibitors, Angiotensin receptor blockers, Lisinopril, and Losartan  Review of Systems   Review of Systems  All other systems reviewed and are negative.   Physical Exam Updated Vital Signs BP (!) 146/92   Pulse 73   Temp 98.1 F (36.7 C) (Oral)   Resp 17   Ht 5\' 10"  (1.778 m)   Wt 97.5 kg   SpO2 97%   BMI 30.85 kg/m   Physical Exam Vitals and nursing note reviewed. Exam conducted with a chaperone present.  Constitutional:      General: He is not in acute distress.    Appearance: He is not toxic-appearing.  HENT:     Head: Normocephalic and atraumatic.  Eyes:     General: No scleral icterus.    Conjunctiva/sclera: Conjunctivae normal.  Cardiovascular:     Rate and Rhythm: Normal rate and regular rhythm.     Pulses: Normal pulses.     Heart sounds: Normal heart sounds.     Comments: Peripheral pulses intact and symmetric.  BP symmetric on each arm. Pulmonary:     Effort: Pulmonary effort is normal. No respiratory distress.     Breath sounds: Normal breath sounds. No stridor. No wheezing, rhonchi or rales.     Comments: CTA bilaterally.  No reproducible tenderness to  palpation. Chest:     Chest wall: No tenderness.  Abdominal:     General: Abdomen is flat. There is no distension.     Palpations: Abdomen is soft.     Tenderness: There is no abdominal tenderness.  Skin:    General: Skin is dry.     Capillary Refill: Capillary refill takes less than 2 seconds.  Neurological:     General: No focal deficit present.     Mental Status: He is alert and oriented to person, place, and time.     GCS: GCS eye subscore is 4. GCS verbal  subscore is 5. GCS motor subscore is 6.     Cranial Nerves: No cranial nerve deficit.     Sensory: No sensory deficit.     Motor: No weakness.     Coordination: Coordination normal.     Gait: Gait normal.     Comments: CN II through XII grossly intact.  No focal deficits.  Sensation intact and symmetric throughout.  Psychiatric:        Mood and Affect: Mood normal.        Behavior: Behavior normal.        Thought Content: Thought content normal.     ED Results / Procedures / Treatments   Labs (all labs ordered are listed, but only abnormal results are displayed) Labs Reviewed  CBC WITH DIFFERENTIAL/PLATELET - Abnormal; Notable for the following components:      Result Value   WBC 3.9 (*)    All other components within normal limits  COMPREHENSIVE METABOLIC PANEL - Abnormal; Notable for the following components:   Glucose, Bld 126 (*)    Creatinine, Ser 1.25 (*)    All other components within normal limits  LIPASE, BLOOD  TROPONIN I (HIGH SENSITIVITY)    EKG EKG Interpretation  Date/Time:  Tuesday September 21 2020 14:51:39 EST Ventricular Rate:  82 PR Interval:    QRS Duration: 105 QT Interval:  404 QTC Calculation: 472 R Axis:   -95 Text Interpretation: Sinus rhythm Left anterior fascicular block Abnormal R-wave progression, late transition No STEMI Confirmed by Alvester Chourifan, Matthew [phone]([id number]) on 09/21/2020 4:10:07 PM   Radiology DG Chest Portable 1 View  Result Date: 09/21/2020 CLINICAL DATA:  Left-sided chest  and back pain. History of ascending thoracic aortic aneurysm. EXAM: PORTABLE CHEST 1 VIEW COMPARISON:  CT chest dated November 20, 2019. Chest x-ray dated November 26, 2018. FINDINGS: The heart size and mediastinal contours are within normal limits. Both lungs are clear. The visualized skeletal structures are unremarkable. IMPRESSION: No active disease. Electronically Signed   By: Obie DredgeWilliam T Derry M.D.   On: 09/21/2020 16:12   CT ANGIO CHEST AORTA W/CM & OR WO/CM  Result Date: 09/21/2020 CLINICAL DATA:  Thoracic aortic aneurysm follow-up. Chest pain left upper back pain EXAM: CT ANGIOGRAPHY CHEST WITH CONTRAST TECHNIQUE: Multidetector CT imaging of the chest was performed using the standard protocol during bolus administration of intravenous contrast. Multiplanar CT image reconstructions and MIPs were obtained to evaluate the vascular anatomy. CONTRAST:  100mL OMNIPAQUE IOHEXOL 350 MG/ML SOLN COMPARISON:  Dec 23, 2019 FINDINGS: Cardiovascular: There are mild atherosclerotic changes of the thoracic aorta. There is no dissection. Again noted is aneurysmal dilatation of the ascending thoracic aorta currently measuring approximately 4.3 cm. There are coronary artery calcifications. The heart size is mildly enlarged. There is no large centrally located pulmonary embolism. Mediastinum/Nodes: -- No mediastinal lymphadenopathy. -- No hilar lymphadenopathy. -- No axillary lymphadenopathy. -- No supraclavicular lymphadenopathy. --there is a left-sided thyroid nodule measuring approximately 1.6 cm (axial series 4, image 10). -  Unremarkable esophagus. Lungs/Pleura: Airways are patent. No pleural effusion, lobar consolidation, pneumothorax or pulmonary infarction. Upper Abdomen: Contrast bolus timing is not optimized for evaluation of the abdominal organs. The visualized portions of the organs of the upper abdomen are normal. Musculoskeletal: No chest wall abnormality. No bony spinal canal stenosis. Review of the MIP images confirms  the above findings. IMPRESSION: 1. No acute findings in the chest. 2. Stable aneurysmal dilatation of the ascending thoracic aorta currently measuring up to 4.3 cm. Recommend annual imaging followup  by CTA or MRA. This recommendation follows 2010 ACCF/AHA/AATS/ACR/ASA/SCA/SCAI/SIR/STS/SVM Guidelines for the Diagnosis and Management of Patients with Thoracic Aortic Disease. Circulation. 2010; 121: O294-T654. Aortic aneurysm NOS (ICD10-I71.9) 3. Left-sided thyroid nodule measuring approximately 1.6 cm. Recommend thyroid ultrasound as an outpatient. (Ref: J Am Coll Radiol. 2015 Feb;12(2): 143-50). Aortic Atherosclerosis (ICD10-I70.0). Electronically Signed   By: Katherine Mantle M.D.   On: 09/21/2020 18:20    Procedures Procedures   Medications Ordered in ED Medications  ketorolac (TORADOL) 30 MG/ML injection 30 mg (30 mg Intravenous Given 09/21/20 1705)  iohexol (OMNIPAQUE) 350 MG/ML injection 100 mL (100 mLs Intravenous Contrast Given 09/21/20 1741)    ED Course  I have reviewed the triage vital signs and the nursing notes.  Pertinent labs & imaging results that were available during my care of the patient were reviewed by me and considered in my medical decision making (see chart for details).    MDM Rules/Calculators/A&P                          Richard Sampson was evaluated in Emergency Department on 09/21/2020 for the symptoms described in the history of present illness. He was evaluated in the context of the global COVID-19 pandemic, which necessitated consideration that the patient might be at risk for infection with the SARS-CoV-2 virus that causes COVID-19. Institutional protocols and algorithms that pertain to the evaluation of patients at risk for COVID-19 are in a state of rapid change based on information released by regulatory bodies including the CDC and federal and state organizations. These policies and algorithms were followed during the patient's care in the ED.  I personally  reviewed patient's medical chart and all notes from triage and staff during today's encounter. I have also ordered and reviewed all labs and imaging that I felt to be medically necessary in the evaluation of this patient's complaints and with consideration of their with their physical exam. If needed, translation services were available and utilized.   Given his history of TAA, his HPI is initially concerning for worsening aneurysm versus dissection.  He also endorses pleuritic symptoms otherwise concerning for pulmonary embolism, but states that he has been taking his Eliquis, as directed.  No recent unilateral extremity swelling or edema.  BP assessed on each arm, symmetric.  Peripheral pulses all also intact and symmetric.  He is neurovascularly intact and hemodynamically stable.  Atypical for ACS and he recently had work-up done early 2021 which was largely reassuring.  We will proceed with laboratory work-up and imaging.  On subsequent evaluation, patient is feeling improved after Toradol shot here in the ED.  Suspect musculoskeletal etiology.  Patient reports that he was lifting up his grandkids and suspects that may have contributed to his strain.  He also lays on his left side.  CT dissection study with no acute findings in the chest.  There is a stable aneurysmal dilatation of the ascending thoracic aorta measuring 4.3 cm.  Recommending annual imaging.  There is also evidence of a left-sided thyroid nodule.  Recommending outpatient ultrasound.  I have provided these instructions to patient and her wife was at bedside.  He will follow up with his primary care provider regarding today's encounter and for ongoing evaluation and management.  Recommending NSAIDs as needed for pain symptoms.  Patient was personally evaluated by Dr. Renaye Rakers who agrees with assessment and plan.  All of the evaluation and work-up results were discussed with the patient and any  family at bedside.  Patient and/or family were  informed that while patient is appropriate for discharge at this time, some medical emergencies may only develop or become detectable after a period of time.  I specifically instructed patient and/or family to return to return to the ED or seek immediate medical attention for any new or worsening symptoms.  They were provided opportunity to ask any additional questions and have none at this time.  Prior to discharge patient is feeling well, agreeable with plan for discharge home.  They have expressed understanding of verbal discharge instructions as well as return precautions and are agreeable to the plan.    Final Clinical Impression(s) / ED Diagnoses Final diagnoses:  Muscle strain    Rx / DC Orders ED Discharge Orders    None       Elvera Maria 09/21/20 1918    Maia Plan, MD 09/22/20 [phone]

## 2020-09-21 NOTE — ED Notes (Signed)
ED Provider at bedside. 

## 2020-09-21 NOTE — ED Notes (Signed)
Pt on monitor and vitals cycling 

## 2020-09-21 NOTE — Discharge Instructions (Addendum)
Please follow-up with your primary care provider regarding today's encounter and for ongoing evaluation and management.  Please read the CT findings below:  IMPRESSION:  1. No acute findings in the chest.  2. Stable aneurysmal dilatation of the ascending thoracic aorta  currently measuring up to 4.3 cm. Recommend annual imaging followup  by CTA or MRA. This recommendation follows 2010  ACCF/AHA/AATS/ACR/ASA/SCA/SCAI/SIR/STS/SVM Guidelines for the  Diagnosis and Management of Patients with Thoracic Aortic Disease.  Circulation. 2010; 121: [id number]. Aortic aneurysm NOS (ICD10-I71.9)  3. Left-sided thyroid nodule measuring approximately 1.6 cm.  Recommend thyroid ultrasound as an outpatient. (Ref: J Am Coll  Radiol. 2015 Feb;12(2): 143-50).  You will need to have annual screenings of your ascending thoracic aneurysm.  You also need to have an ultrasound of your thyroid nodule.  You likely will need TSH screening.  Your thoracic region discomfort that is worse with left arm movement and inspiration is likely suggestive of muscular strain.  Please take NSAIDs and use heating pads as needed.  Return to the ED or seek immediate medical attention should you experience any new or worsening symptoms.

## 2020-10-06 ENCOUNTER — Other Ambulatory Visit: Payer: Self-pay

## 2020-10-06 ENCOUNTER — Ambulatory Visit (INDEPENDENT_AMBULATORY_CARE_PROVIDER_SITE_OTHER): Payer: Medicare PPO | Admitting: Internal Medicine

## 2020-10-06 ENCOUNTER — Encounter: Payer: Self-pay | Admitting: Internal Medicine

## 2020-10-06 VITALS — BP 132/80 | HR 58 | Temp 98.3°F | Ht 70.0 in | Wt 221.2 lb

## 2020-10-06 DIAGNOSIS — E6609 Other obesity due to excess calories: Secondary | ICD-10-CM

## 2020-10-06 DIAGNOSIS — I700 Atherosclerosis of aorta: Secondary | ICD-10-CM | POA: Diagnosis not present

## 2020-10-06 DIAGNOSIS — R7309 Other abnormal glucose: Secondary | ICD-10-CM | POA: Diagnosis not present

## 2020-10-06 DIAGNOSIS — I119 Hypertensive heart disease without heart failure: Secondary | ICD-10-CM | POA: Diagnosis not present

## 2020-10-06 DIAGNOSIS — Z6831 Body mass index (BMI) 31.0-31.9, adult: Secondary | ICD-10-CM | POA: Diagnosis not present

## 2020-10-06 DIAGNOSIS — I480 Paroxysmal atrial fibrillation: Secondary | ICD-10-CM

## 2020-10-06 NOTE — Progress Notes (Signed)
I,[name],acting as a Neurosurgeon for Gwynneth Aliment, MD.,have documented all relevant documentation on the behalf of Gwynneth Aliment, MD,as directed by  Gwynneth Aliment, MD while in the presence of Gwynneth Aliment, MD.  This visit occurred during the SARS-CoV-2 public health emergency.  Safety protocols were in place, including screening questions prior to the visit, additional usage of staff PPE, and extensive cleaning of exam room while observing appropriate contact time as indicated for disinfecting solutions.  Subjective:     Patient ID: Richard Sampson , male    DOB: 02-10-1946 , 75 y.o.   MRN: [id number]   Chief Complaint  Patient presents with  . Hypertension    HPI  He is here today for BP check. He reports compliance with meds. He denies headaches, chest pain and shortness of breath.   Hypertension This is a chronic problem. The current episode started more than 1 year ago. The problem has been gradually improving since onset. The problem is controlled. Pertinent negatives include no blurred vision, chest pain, palpitations or shortness of breath. The current treatment provides moderate improvement. Compliance problems include exercise.      Past Medical History:  Diagnosis Date  . A-fib (HCC) 07/17/2014  . High cholesterol   . Hypertension   . Pneumonia      Family History  Problem Relation Age of Onset  . Thyroid nodules Mother   . Dementia Mother   . Heart failure Father   . Cancer Father   . Kidney disease Son   . Diabetes Brother   . Cancer Brother   . Heart attack Neg Hx   . Stroke Neg Hx      Current Outpatient Medications:  .  amLODipine (NORVASC) 10 MG tablet, Take 10 mg by mouth daily., Disp: , Rfl:  .  ammonium lactate (LAC-HYDRIN) 12 % lotion, Apply 1 application topically 2 (two) times daily., Disp: , Rfl:  .  apixaban (ELIQUIS) 5 MG TABS tablet, Take 1 tablet by mouth 2 times a day. Need to schedule appt with cardiologist for refills.,  Disp: 60 tablet, Rfl: 0 .  atorvastatin (LIPITOR) 80 MG tablet, Take 1 tablet (80 mg total) by mouth daily., Disp: 90 tablet, Rfl: 3 .  cholecalciferol (VITAMIN D3) 10 MCG (400 UNIT) TABS tablet, Take 400 Units by mouth., Disp: , Rfl:  .  diclofenac sodium (VOLTAREN) 1 % GEL, Apply 2 g topically 3 (three) times daily as needed., Disp: , Rfl:  .  diltiazem (TIAZAC) 180 MG 24 hr capsule, Take 180 mg by mouth 2 (two) times daily., Disp: , Rfl: 0 .  ferrous sulfate 325 (65 FE) MG tablet, Take 325 mg by mouth daily with breakfast., Disp: , Rfl:  .  potassium chloride SA (K-DUR,KLOR-CON) 20 MEQ tablet, Take 40 mEq by mouth 2 (two) times daily. Morning and evening, Disp: , Rfl:  .  triamterene-hydrochlorothiazide (MAXZIDE) 75-50 MG per tablet, Take 0.5 tablets by mouth every evening. , Disp: , Rfl:    Allergies  Allergen Reactions  . Ace Inhibitors Swelling    Throat swelling; pt was on lisinopril  . Angiotensin Receptor Blockers Swelling    Cross reactivity in known ACE I allergy  . Lisinopril Swelling    Throat swelling  . Losartan Other (See Comments)    Doctor told pt not to take     Review of Systems  Constitutional: Negative.   Eyes: Negative for blurred vision.  Respiratory: Negative.  Negative for shortness of  breath.   Cardiovascular: Negative.  Negative for chest pain and palpitations.  Gastrointestinal: Negative.   Psychiatric/Behavioral: Negative.   All other systems reviewed and are negative.    Today's Vitals   10/06/20 1128  BP: 132/80  Pulse: (!) 58  Temp: 98.3 F (36.8 C)  TempSrc: Oral  Weight: 221 lb 3.2 oz (100.3 kg)  Height: 5\' 10"  (1.778 m)  PainSc: 0-No pain   Body mass index is 31.74 kg/m.  Wt Readings from Last 3 Encounters:  10/06/20 221 lb 3.2 oz (100.3 kg)  09/21/20 215 lb (97.5 kg)  05/03/20 219 lb (99.3 kg)   Objective:  Physical Exam Vitals and nursing note reviewed.  Constitutional:      Appearance: Normal appearance. He is obese.  HENT:      Head: Normocephalic and atraumatic.     Nose:     Comments: Masked     Mouth/Throat:     Comments: Masked  Cardiovascular:     Rate and Rhythm: Normal rate and regular rhythm.     Heart sounds: Normal heart sounds.  Pulmonary:     Breath sounds: Normal breath sounds.  Musculoskeletal:     Cervical back: Normal range of motion.  Skin:    General: Skin is warm.  Neurological:     General: No focal deficit present.     Mental Status: He is alert and oriented to person, place, and time.         Assessment And Plan:     1. Hypertensive heart disease without heart failure Comments: Chronic, controlled. He is aware goal BP is less than 120/80. Will check renal function today.   2. Paroxysmal atrial fibrillation (HCC) Comments: Chronic, intermittent. He is in NSR today, rate controlled and properly anticoagualated.   3. Other abnormal glucose Comments: His a1c has been elevated in the past. I will recheck an a1c today. Advised to limit his intake of sugary beverages.   4. Atherosclerosis of aorta (HCC) Comments: Chronic, advised to follow heart healthy lifestyle. Advised to comply with statin use, exercise 150 minutes per week and increase fiber intake.   5. Class 1 obesity due to excess calories with serious comorbidity and body mass index (BMI) of 31.0 to 31.9 in adult Comments: He is encouraged to lose 7-10 pounds to decrease his cardiac risk. He plans to start riding his bike once weather improves.   Patient was given opportunity to ask questions. Patient verbalized understanding of the plan and was able to repeat key elements of the plan. All questions were answered to their satisfaction.   I, 05/05/20, MD, have reviewed all documentation for this visit. The documentation on 10/17/20 for the exam, diagnosis, procedures, and orders are all accurate and complete.  THE PATIENT IS ENCOURAGED TO PRACTICE SOCIAL DISTANCING DUE TO THE COVID-19 PANDEMIC.

## 2020-10-06 NOTE — Patient Instructions (Signed)

## 2020-10-20 ENCOUNTER — Ambulatory Visit (INDEPENDENT_AMBULATORY_CARE_PROVIDER_SITE_OTHER): Payer: Medicare PPO

## 2020-10-20 ENCOUNTER — Other Ambulatory Visit: Payer: Self-pay

## 2020-10-20 VITALS — BP 130/70 | HR 84 | Temp 98.1°F | Ht 68.4 in | Wt 221.0 lb

## 2020-10-20 DIAGNOSIS — Z0000 Encounter for general adult medical examination without abnormal findings: Secondary | ICD-10-CM | POA: Diagnosis not present

## 2020-10-20 NOTE — Patient Instructions (Signed)
Richard Sampson , Thank you for taking time to come for your Medicare Wellness Visit. I appreciate your ongoing commitment to your health goals. Please review the following plan we discussed and let me know if I can assist you in the future.   Screening recommendations/referrals: Colonoscopy: completed around 2020 per patient Recommended yearly ophthalmology/optometry visit for glaucoma screening and checkup Recommended yearly dental visit for hygiene and checkup  Vaccinations: Influenza vaccine: completed 06/02/2020, due 03/21/2021 Pneumococcal vaccine: completed 06/21/2018 Tdap vaccine: completed 08/21/2012, due 08/21/2022 Shingles vaccine: discussed   Covid-19:  05/28/2020, 10/12/2019, 09/21/2019  Advanced directives: copy in chart  Conditions/risks identified: smoking  Next appointment: Follow up in one year for your annual wellness visit.   Preventive Care 73 Years and Older, Male Preventive care refers to lifestyle choices and visits with your health care provider that can promote health and wellness. What does preventive care include?  A yearly physical exam. This is also called an annual well check.  Dental exams once or twice a year.  Routine eye exams. Ask your health care provider how often you should have your eyes checked.  Personal lifestyle choices, including:  Daily care of your teeth and gums.  Regular physical activity.  Eating a healthy diet.  Avoiding tobacco and drug use.  Limiting alcohol use.  Practicing safe sex.  Taking low doses of aspirin every day.  Taking vitamin and mineral supplements as recommended by your health care provider. What happens during an annual well check? The services and screenings done by your health care provider during your annual well check will depend on your age, overall health, lifestyle risk factors, and family history of disease. Counseling  Your health care provider may ask you questions about your:  Alcohol  use.  Tobacco use.  Drug use.  Emotional well-being.  Home and relationship well-being.  Sexual activity.  Eating habits.  History of falls.  Memory and ability to understand (cognition).  Work and work Astronomer. Screening  You may have the following tests or measurements:  Height, weight, and BMI.  Blood pressure.  Lipid and cholesterol levels. These may be checked every 5 years, or more frequently if you are over 35 years old.  Skin check.  Lung cancer screening. You may have this screening every year starting at age 35 if you have a 30-pack-year history of smoking and currently smoke or have quit within the past 15 years.  Fecal occult blood test (FOBT) of the stool. You may have this test every year starting at age 73.  Flexible sigmoidoscopy or colonoscopy. You may have a sigmoidoscopy every 5 years or a colonoscopy every 10 years starting at age 73.  Prostate cancer screening. Recommendations will vary depending on your family history and other risks.  Hepatitis C blood test.  Hepatitis B blood test.  Sexually transmitted disease (STD) testing.  Diabetes screening. This is done by checking your blood sugar (glucose) after you have not eaten for a while (fasting). You may have this done every 1-3 years.  Abdominal aortic aneurysm (AAA) screening. You may need this if you are a current or former smoker.  Osteoporosis. You may be screened starting at age 73 if you are at high risk. Talk with your health care provider about your test results, treatment options, and if necessary, the need for more tests. Vaccines  Your health care provider may recommend certain vaccines, such as:  Influenza vaccine. This is recommended every year.  Tetanus, diphtheria, and acellular pertussis (Tdap, Td)  vaccine. You may need a Td booster every 10 years.  Zoster vaccine. You may need this after age 73.  Pneumococcal 13-valent conjugate (PCV13) vaccine. One dose is  recommended after age 73.  Pneumococcal polysaccharide (PPSV23) vaccine. One dose is recommended after age 73. Talk to your health care provider about which screenings and vaccines you need and how often you need them. This information is not intended to replace advice given to you by your health care provider. Make sure you discuss any questions you have with your health care provider. Document Released: 09/03/2015 Document Revised: 04/26/2016 Document Reviewed: 06/08/2015 Elsevier Interactive Patient Education  2017 [employer] Prevention in the Home Falls can cause injuries. They can happen to people of all ages. There are many things you can do to make your home safe and to help prevent falls. What can I do on the outside of my home?  Regularly fix the edges of walkways and driveways and fix any cracks.  Remove anything that might make you trip as you walk through a door, such as a raised step or threshold.  Trim any bushes or trees on the path to your home.  Use bright outdoor lighting.  Clear any walking paths of anything that might make someone trip, such as rocks or tools.  Regularly check to see if handrails are loose or broken. Make sure that both sides of any steps have handrails.  Any raised decks and porches should have guardrails on the edges.  Have any leaves, snow, or ice cleared regularly.  Use sand or salt on walking paths during winter.  Clean up any spills in your garage right away. This includes oil or grease spills. What can I do in the bathroom?  Use night lights.  Install grab bars by the toilet and in the tub and shower. Do not use towel bars as grab bars.  Use non-skid mats or decals in the tub or shower.  If you need to sit down in the shower, use a plastic, non-slip stool.  Keep the floor dry. Clean up any water that spills on the floor as soon as it happens.  Remove soap buildup in the tub or shower regularly.  Attach bath mats  securely with double-sided non-slip rug tape.  Do not have throw rugs and other things on the floor that can make you trip. What can I do in the bedroom?  Use night lights.  Make sure that you have a light by your bed that is easy to reach.  Do not use any sheets or blankets that are too big for your bed. They should not hang down onto the floor.  Have a firm chair that has side arms. You can use this for support while you get dressed.  Do not have throw rugs and other things on the floor that can make you trip. What can I do in the kitchen?  Clean up any spills right away.  Avoid walking on wet floors.  Keep items that you use a lot in easy-to-reach places.  If you need to reach something above you, use a strong step stool that has a grab bar.  Keep electrical cords out of the way.  Do not use floor polish or wax that makes floors slippery. If you must use wax, use non-skid floor wax.  Do not have throw rugs and other things on the floor that can make you trip. What can I do with my stairs?  Do not leave  any items on the stairs.  Make sure that there are handrails on both sides of the stairs and use them. Fix handrails that are broken or loose. Make sure that handrails are as long as the stairways.  Check any carpeting to make sure that it is firmly attached to the stairs. Fix any carpet that is loose or worn.  Avoid having throw rugs at the top or bottom of the stairs. If you do have throw rugs, attach them to the floor with carpet tape.  Make sure that you have a light switch at the top of the stairs and the bottom of the stairs. If you do not have them, ask someone to add them for you. What else can I do to help prevent falls?  Wear shoes that:  Do not have high heels.  Have rubber bottoms.  Are comfortable and fit you well.  Are closed at the toe. Do not wear sandals.  If you use a stepladder:  Make sure that it is fully opened. Do not climb a closed  stepladder.  Make sure that both sides of the stepladder are locked into place.  Ask someone to hold it for you, if possible.  Clearly mark and make sure that you can see:  Any grab bars or handrails.  First and last steps.  Where the edge of each step is.  Use tools that help you move around (mobility aids) if they are needed. These include:  Canes.  Walkers.  Scooters.  Crutches.  Turn on the lights when you go into a dark area. Replace any light bulbs as soon as they burn out.  Set up your furniture so you have a clear path. Avoid moving your furniture around.  If any of your floors are uneven, fix them.  If there are any pets around you, be aware of where they are.  Review your medicines with your doctor. Some medicines can make you feel dizzy. This can increase your chance of falling. Ask your doctor what other things that you can do to help prevent falls. This information is not intended to replace advice given to you by your health care provider. Make sure you discuss any questions you have with your health care provider. Document Released: 06/03/2009 Document Revised: 01/13/2016 Document Reviewed: 09/11/2014 Elsevier Interactive Patient Education  2017 [employer].

## 2020-10-20 NOTE — Progress Notes (Signed)
This visit occurred during the SARS-CoV-2 public health emergency.  Safety protocols were in place, including screening questions prior to the visit, additional usage of staff PPE, and extensive cleaning of exam room while observing appropriate contact time as indicated for disinfecting solutions.  Subjective:   Richard Sampson is a 75 y.o. male who presents for Medicare Annual/Subsequent preventive examination.  Review of Systems     Cardiac Risk Factors include: advanced age (>69men, >63 women);dyslipidemia;hypertension;male gender;obesity (BMI >30kg/m2);smoking/ tobacco exposure     Objective:    Today's Vitals   10/20/20 0839 10/20/20 0844  BP: 130/70   Pulse: 84   Temp: 98.1 F (36.7 C)   TempSrc: Oral   SpO2: 96%   Weight: 221 lb (100.2 kg)   Height: 5' 8.4" (1.737 m)   PainSc:  6    Body mass index is 33.21 kg/m.  Advanced Directives 10/20/2020 09/21/2020 01/28/2020 01/16/2019 11/26/2018 10/09/2018 08/21/2018  Does Patient Have a Medical Advance Directive? Yes No Yes Yes No;Yes No No  Type of Estate agent of [place];Living will - Healthcare Power of [place];Living will Healthcare Power of [place];Living will Living will - -  Does patient want to make changes to medical advance directive? - - - - No - Patient declined - -  Copy of Healthcare Power of Attorney in Chart? Yes - validated most recent copy scanned in chart (See row information) - Yes - validated most recent copy scanned in chart (See row information) Yes - validated most recent copy scanned in chart (See row information) - - -  Would patient like information on creating a medical advance directive? - No - Patient declined - - No - Patient declined - -  Pre-existing out of facility DNR order (yellow form or pink MOST form) - - - - - - -    Current Medications (verified) Outpatient Encounter Medications as of 10/20/2020  Medication Sig  . amLODipine (NORVASC) 10 MG tablet Take 10 mg by mouth daily.   Marland Kitchen ammonium lactate (LAC-HYDRIN) 12 % lotion Apply 1 application topically 2 (two) times daily.  Marland Kitchen apixaban (ELIQUIS) 5 MG TABS tablet Take 1 tablet by mouth 2 times a day. Need to schedule appt with cardiologist for refills.  Marland Kitchen atorvastatin (LIPITOR) 80 MG tablet Take 1 tablet (80 mg total) by mouth daily.  . cholecalciferol (VITAMIN D3) 10 MCG (400 UNIT) TABS tablet Take 400 Units by mouth.  . diclofenac sodium (VOLTAREN) 1 % GEL Apply 2 g topically 3 (three) times daily as needed.  . diltiazem (TIAZAC) 180 MG 24 hr capsule Take 180 mg by mouth 2 (two) times daily.  . ferrous sulfate 325 (65 FE) MG tablet Take 325 mg by mouth daily with breakfast.  . potassium chloride SA (K-DUR,KLOR-CON) 20 MEQ tablet Take 40 mEq by mouth 2 (two) times daily. Morning and evening  . triamterene-hydrochlorothiazide (MAXZIDE) 75-50 MG per tablet Take 0.5 tablets by mouth every evening.    No facility-administered encounter medications on file as of 10/20/2020.    Allergies (verified) Ace inhibitors, Angiotensin receptor blockers, Lisinopril, and Losartan   History: Past Medical History:  Diagnosis Date  . A-fib (HCC) 07/17/2014  . High cholesterol   . Hypertension   . Pneumonia    Past Surgical History:  Procedure Laterality Date  . TONSILLECTOMY     Family History  Problem Relation Age of Onset  . Thyroid nodules Mother   . Dementia Mother   . Heart failure Father   .  Cancer Father   . Kidney disease Son   . Diabetes Brother   . Cancer Brother   . Heart attack Neg Hx   . Stroke Neg Hx    Social History   Socioeconomic History  . Marital status: Married    Spouse name: Not on file  . Number of children: Not on file  . Years of education: Not on file  . Highest education level: Not on file  Occupational History  . Occupation: retired  Tobacco Use  . Smoking status: Current Every Day Smoker    Packs/day: 0.25    Years: 20.00    Pack years: 5.00    Types: Cigars    Start date: 311965     Last attempt to quit: 08/18/2018    Years since quitting: 2.1  . Smokeless tobacco: Never Used  . Tobacco comment: Started at age 75 - 1ppdx 1820, quit x 5; pipex5 years, cigarsx15  Vaping Use  . Vaping Use: Never used  Substance and Sexual Activity  . Alcohol use: Yes    Comment: 1-2 oz per month   . Drug use: Not Currently  . Sexual activity: Yes  Other Topics Concern  . Not on file  Social History Narrative  . Not on file   Social Determinants of Health   Financial Resource Strain: Low Risk   . Difficulty of Paying Living Expenses: Not hard at all  Food Insecurity: No Food Insecurity  . Worried About Programme researcher, broadcasting/film/videounning Out of Food in the Last Year: Never true  . Ran Out of Food in the Last Year: Never true  Transportation Needs: No Transportation Needs  . Lack of Transportation (Medical): No  . Lack of Transportation (Non-Medical): No  Physical Activity: Sufficiently Active  . Days of Exercise per Week: 4 days  . Minutes of Exercise per Session: 60 min  Stress: No Stress Concern Present  . Feeling of Stress : Not at all  Social Connections: Not on file    Tobacco Counseling Ready to quit: Not Answered Counseling given: Not Answered Comment: Started at age 75 - 1ppdx 2920, quit x 5; pipex5 years, cigarsx15   Clinical Intake:  Pre-visit preparation completed: Yes  Pain : 0-10 Pain Score: 6  Pain Type: Chronic pain Pain Location: Back Pain Orientation: Lower Pain Descriptors / Indicators: Aching Pain Frequency: Constant Pain Relieving Factors: hot showers help  Pain Relieving Factors: hot showers help  Nutritional Status: BMI > 30  Obese Nutritional Risks: None Diabetes: No  How often do you need to have someone help you when you read instructions, pamphlets, or other written materials from your doctor or pharmacy?: 1 - Never What is the last grade level you completed in school?: close to masters degree  Diabetic? no  Interpreter Needed?: No  Information entered  by :: NAllen LPN   Activities of Daily Living In your present state of health, do you have any difficulty performing the following activities: 10/20/2020 01/28/2020  Hearing? N N  Comment - if speaking to low or fast  Vision? N N  Difficulty concentrating or making decisions? N N  Walking or climbing stairs? N N  Dressing or bathing? N N  Doing errands, shopping? N N  Preparing Food and eating ? N N  Using the Toilet? N N  In the past six months, have you accidently leaked urine? N N  Do you have problems with loss of bowel control? N N  Managing your Medications? N N  Managing your  Finances? N N  Housekeeping or managing your Housekeeping? N N  Some recent data might be hidden    Patient Care Team: [name], MD as PCP - General (Internal Medicine) Quintella Reichert, MD as PCP - Cardiology (Cardiology)  Indicate any recent Medical Services you may have received from other than [facility] providers in the past year (date may be approximate).     Assessment:   This is a routine wellness examination for Richard Sampson.  Hearing/Vision screen  Hearing Screening   125Hz  250Hz  500Hz  1000Hz  2000Hz  3000Hz  4000Hz  6000Hz  8000Hz   Right ear:           Left ear:           Vision Screening Comments[facility]  Dietary issues and exercise activities discussed: Current Exercise Habits: Home exercise routine, Type of exercise: Other - see comments (bike), Time (Minutes): 60, Frequency (Times/Week): 4, Weekly Exercise (Minutes/Week): 240  Goals    . Patient Stated     01/28/2020, no goals    . Patient Stated     10/20/2020, no goals    . Quit Smoking      Depression Screen PHQ 2/9 Scores 10/20/2020 01/28/2020 05/01/2019 01/16/2019 10/10/2018 09/26/2018 08/23/2018  PHQ - 2 Score 0 0 0 0 0 0 0  PHQ- 9 Score - 0 - 0 - - -    Fall Risk Fall Risk  10/20/2020 01/28/2020 05/01/2019 01/16/2019 10/10/2018  Falls in the past year? 0 1 0 0 0  Comment - lost balance - - -  Number falls in past yr: - 0 - - -   Injury with Fall? - 0 - - -  Risk for fall due to : Medication side effect Medication side effect - Medication side effect -  Follow up Falls evaluation completed;Education provided;Falls prevention discussed Falls evaluation completed;Education provided;Falls prevention discussed - Falls prevention discussed -    FALL RISK PREVENTION PERTAINING TO THE HOME:  Any stairs in or around the home? No  If so, are there any without handrails? n/a Home free of loose throw rugs in walkways, pet beds, electrical cords, etc? Yes  Adequate lighting in your home to reduce risk of falls? Yes   ASSISTIVE DEVICES UTILIZED TO PREVENT FALLS:  Life alert? No  Use of a cane, walker or w/c? No  Grab bars in the bathroom? Yes  Shower chair or bench in shower? Yes  Elevated toilet seat or a handicapped toilet? No   TIMED UP AND GO:  Was the test performed? No . Gait steady and fast without use of assistive device  Cognitive Function:     6CIT Screen 10/20/2020 01/28/2020 01/16/2019  What Year? 0 points 0 points 0 points  What month? 0 points 0 points 0 points  What time? 0 points 0 points 3 points  Count back from 20 0 points 0 points 0 points  Months in reverse 0 points 0 points 0 points  Repeat phrase 4 points 0 points 0 points  Total Score 4 0 3    Immunizations Immunization History  Administered Date(s) Administered  . Influenza, High Dose Seasonal PF 05/01/2019  . Influenza-Unspecified 06/21/2018  . PFIZER(Purple Top)SARS-COV-2 Vaccination 09/21/2019, 10/12/2019, 05/28/2020  . Pneumococcal Polysaccharide-23 01/31/2017  . Tdap 07/25/2010    TDAP status: Up to date  Flu Vaccine status: Up to date  Pneumococcal vaccine status: Up to date  Covid-19 vaccine status: Completed vaccines  Qualifies for Shingles Vaccine? Yes   Zostavax completed No  Shingrix Completed?: No.    Education has been provided regarding the importance of this vaccine. Patient has been advised to call insurance  company to determine out of pocket expense if they have not yet received this vaccine. Advised may also receive vaccine at local pharmacy or Health Dept. Verbalized acceptance and understanding.  Screening Tests Health Maintenance  Topic Date Due  . COLONOSCOPY (Pts 45-27yrs Insurance coverage will need to be confirmed)  01/27/2021 (Originally 04/30/2018)  . TETANUS/TDAP  08/21/2022  . INFLUENZA VACCINE  Completed  . COVID-19 Vaccine  Completed  . Hepatitis C Screening  Completed  . PNA vac Low Risk Adult  Completed  . HPV VACCINES  Aged Out    Health Maintenance  There are no preventive care reminders to display for this patient.  Colorectal cancer screening: Type of screening: Colonoscopy. Completed 2020. Repeat every 5 years  Lung Cancer Screening: (Low Dose CT Chest recommended if Age 77-80 years, 30 pack-year currently smoking OR have quit w/in 15years.) does not qualify.   Lung Cancer Screening Referral: no  Additional Screening:  Hepatitis C Screening: does qualify; Completed 09/02/2012  Vision Screening: Recommended annual ophthalmology exams for early detection of glaucoma and other disorders of the eye. Is the patient up to date with their annual eye exam?  Yes  Who is the provider or what is the name of the office in which the patient attends [facility] exams[facility] If pt is not established with a provider, would they like to be referred to a provider to establish care? No .   Dental Screening: Recommended annual dental exams for proper oral hygiene  Community Resource Referral / Chronic Care Management: CRR required this visit?  No   CCM required this visit?  No      Plan:     I have personally reviewed and noted the following in the patient's chart:   . Medical and social history . Use of alcohol, tobacco or illicit drugs  . Current medications and supplements . Functional ability and status . Nutritional status . Physical activity . Advanced  directives . List of other physicians . Hospitalizations, surgeries, and ER visits in previous 12 months . Vitals . Screenings to include cognitive, depression, and falls . Referrals and appointments  In addition, I have reviewed and discussed with patient certain preventive protocols, quality metrics, and best practice recommendations. A written personalized care plan for preventive services as well as general preventive health recommendations were provided to patient.     Barb Merino, LPN   03/23/6628   Nurse Notes:

## 2020-11-01 ENCOUNTER — Encounter: Payer: Self-pay | Admitting: Internal Medicine

## 2020-11-01 ENCOUNTER — Ambulatory Visit (INDEPENDENT_AMBULATORY_CARE_PROVIDER_SITE_OTHER): Payer: Medicare PPO | Admitting: Internal Medicine

## 2020-11-01 ENCOUNTER — Other Ambulatory Visit: Payer: Self-pay

## 2020-11-01 VITALS — BP 132/78 | HR 86 | Temp 98.2°F | Ht 68.4 in | Wt 222.2 lb

## 2020-11-01 DIAGNOSIS — I119 Hypertensive heart disease without heart failure: Secondary | ICD-10-CM

## 2020-11-01 DIAGNOSIS — E6609 Other obesity due to excess calories: Secondary | ICD-10-CM | POA: Diagnosis not present

## 2020-11-01 DIAGNOSIS — I480 Paroxysmal atrial fibrillation: Secondary | ICD-10-CM | POA: Diagnosis not present

## 2020-11-01 DIAGNOSIS — Z6833 Body mass index (BMI) 33.0-33.9, adult: Secondary | ICD-10-CM | POA: Diagnosis not present

## 2020-11-01 NOTE — Patient Instructions (Signed)

## 2020-11-01 NOTE — Progress Notes (Signed)
I,[name],acting as a Neurosurgeon for Gwynneth Aliment, MD.,have documented all relevant documentation on the behalf of Gwynneth Aliment, MD,as directed by  Gwynneth Aliment, MD while in the presence of Gwynneth Aliment, MD.  This visit occurred during the SARS-CoV-2 public health emergency.  Safety protocols were in place, including screening questions prior to the visit, additional usage of staff PPE, and extensive cleaning of exam room while observing appropriate contact time as indicated for disinfecting solutions.  Subjective:     Patient ID: Richard Sampson , male    DOB: December 11, 1945 , 75 y.o.   MRN: [id number]   Chief Complaint  Patient presents with  . Hypertension    HPI  He is here today for BP check. He reports compliance with meds. He denies headaches, chest pain and shortness of breath.   Hypertension This is a chronic problem. The current episode started more than 1 year ago. The problem has been gradually improving since onset. The problem is controlled. Pertinent negatives include no blurred vision, chest pain, palpitations or shortness of breath. The current treatment provides moderate improvement. Compliance problems include exercise.      Past Medical History:  Diagnosis Date  . A-fib (HCC) 07/17/2014  . High cholesterol   . Hypertension   . Pneumonia      Family History  Problem Relation Age of Onset  . Thyroid nodules Mother   . Dementia Mother   . Heart failure Father   . Cancer Father   . Kidney disease Son   . Diabetes Brother   . Cancer Brother   . Heart attack Neg Hx   . Stroke Neg Hx      Current Outpatient Medications:  .  acetaminophen (TYLENOL) 325 MG tablet, Take 650 mg by mouth every 6 (six) hours as needed., Disp: , Rfl:  .  amLODipine (NORVASC) 10 MG tablet, Take 10 mg by mouth daily., Disp: , Rfl:  .  ammonium lactate (LAC-HYDRIN) 12 % lotion, Apply 1 application topically 2 (two) times daily., Disp: , Rfl:  .  apixaban (ELIQUIS) 5 MG  TABS tablet, Take 1 tablet by mouth 2 times a day. Need to schedule appt with cardiologist for refills., Disp: 60 tablet, Rfl: 0 .  atorvastatin (LIPITOR) 80 MG tablet, Take 1 tablet (80 mg total) by mouth daily., Disp: 90 tablet, Rfl: 3 .  camphor-menthol (SARNA) lotion, Apply 1 application topically as needed for itching., Disp: , Rfl:  .  cholecalciferol (VITAMIN D3) 10 MCG (400 UNIT) TABS tablet, Take 400 Units by mouth., Disp: , Rfl:  .  diclofenac sodium (VOLTAREN) 1 % GEL, Apply 2 g topically 3 (three) times daily as needed., Disp: , Rfl:  .  diltiazem (TIAZAC) 180 MG 24 hr capsule, Take 180 mg by mouth 2 (two) times daily., Disp: , Rfl: 0 .  ferrous sulfate 325 (65 FE) MG tablet, Take 325 mg by mouth daily with breakfast., Disp: , Rfl:  .  potassium chloride SA (K-DUR,KLOR-CON) 20 MEQ tablet, Take 40 mEq by mouth 2 (two) times daily. Morning and evening, Disp: , Rfl:  .  triamterene-hydrochlorothiazide (MAXZIDE) 75-50 MG per tablet, Take 0.5 tablets by mouth every evening. , Disp: , Rfl:    Allergies  Allergen Reactions  . Ace Inhibitors Swelling    Throat swelling; pt was on lisinopril  . Angiotensin Receptor Blockers Swelling    Cross reactivity in known ACE I allergy  . Lisinopril Swelling    Throat swelling  .  Losartan Other (See Comments)    Doctor told pt not to take     Review of Systems  Constitutional: Negative.   Eyes: Negative for blurred vision.  Respiratory: Negative.  Negative for shortness of breath.   Cardiovascular: Negative.  Negative for chest pain and palpitations.  Gastrointestinal: Negative.   Psychiatric/Behavioral: Negative.   All other systems reviewed and are negative.    Today's Vitals   11/01/20 0904  BP: 132/78  Pulse: 86  Temp: 98.2 F (36.8 C)  TempSrc: Oral  Weight: 222 lb 3.2 oz (100.8 kg)  Height: 5' 8.4" (1.737 m)  PainSc: 5   PainLoc: Shoulder   Body mass index is 33.39 kg/m.  Wt Readings from Last 3 Encounters:  11/01/20 222  lb 3.2 oz (100.8 kg)  10/20/20 221 lb (100.2 kg)  10/06/20 221 lb 3.2 oz (100.3 kg)   Objective:  Physical Exam Vitals and nursing note reviewed.  Constitutional:      Appearance: Normal appearance. He is obese.  HENT:     Head: Normocephalic and atraumatic.     Nose:     Comments: Masked     Mouth/Throat:     Comments: Masked  Cardiovascular:     Rate and Rhythm: Normal rate and regular rhythm.     Heart sounds: Normal heart sounds.  Pulmonary:     Breath sounds: Normal breath sounds.  Musculoskeletal:     Cervical back: Normal range of motion.  Skin:    General: Skin is warm.  Neurological:     General: No focal deficit present.     Mental Status: He is alert and oriented to person, place, and time.         Assessment And Plan:     1. Hypertensive heart disease without heart failure Comments: Chronic, fair control. He will c/w current meds. He is encouraged to avoid adding salt to his foods.   2. Paroxysmal atrial fibrillation (HCC) Comments: Chronic, currently in sinus rhythm.   3. Class 1 obesity due to excess calories with serious comorbidity and body mass index (BMI) of 33.0 to 33.9 in adult  He is encouraged to strive for BMI less than 30 to decrease cardiac risk. Advised to aim for at least 150 minutes of exercise per week.   Patient was given opportunity to ask questions. Patient verbalized understanding of the plan and was able to repeat key elements of the plan. All questions were answered to their satisfaction.   I, Gwynneth Aliment, MD, have reviewed all documentation for this visit. The documentation on 11/01/20 for the exam, diagnosis, procedures, and orders are all accurate and complete.   IF YOU HAVE BEEN REFERRED TO A SPECIALIST, IT MAY TAKE 1-2 WEEKS TO SCHEDULE/PROCESS THE REFERRAL. IF YOU HAVE NOT HEARD FROM US/SPECIALIST IN TWO WEEKS, PLEASE GIVE [place] A CALL AT [phone].   THE PATIENT IS ENCOURAGED TO PRACTICE SOCIAL DISTANCING DUE TO THE  COVID-19 PANDEMIC.

## 2020-11-10 ENCOUNTER — Telehealth: Payer: Self-pay | Admitting: *Deleted

## 2020-11-10 NOTE — Chronic Care Management (AMB) (Signed)
  Chronic Care Management   Note  11/10/2020 Name: [name] MRN: [id number] DOB: 05-Feb-1946  Richard Sampson is a 75 y.o. year old male who is a primary care patient of Glendale Chard, MD. I reached out to Colin Broach by phone today in response to a referral sent by Richard Sampson's PCP, Dr. Baird Cancer.     [name] was given information about Chronic Care Management services today including:  1. CCM service includes personalized support from designated clinical staff supervised by his physician, including individualized plan of care and coordination with other care providers 2. 24/7 contact phone numbers for assistance for urgent and routine care needs. 3. Service will only be billed when office clinical staff spend 20 minutes or more in a month to coordinate care. 4. Only one practitioner may furnish and bill the service in a calendar month. 5. The patient may stop CCM services at any time (effective at the end of the month) by phone call to the office staff. 6. The patient will be responsible for cost sharing (co-pay) of up to 20% of the service fee (after annual deductible is met).  Patient agreed to services and verbal consent obtained.   Follow up plan: Telephone appointment with care management team member scheduled for:11/19/2020  [place] [facility]

## 2020-11-19 ENCOUNTER — Telehealth: Payer: Medicare PPO

## 2020-11-22 ENCOUNTER — Telehealth: Payer: Medicare PPO

## 2020-11-22 ENCOUNTER — Telehealth: Payer: Self-pay | Admitting: *Deleted

## 2020-11-22 NOTE — Chronic Care Management (AMB) (Signed)
  Care Management   Note  11/22/2020 Name: Richard Sampson MRN: [id number] DOB: [birth year]  Richard Sampson is a 75 y.o. year old male who is a primary care patient of [name], MD and is actively engaged with the care management team. I reached out to [name] by phone today to assist with re-scheduling an initial visit with the RN Case Manager  Follow up plan: Patient declines further follow up and engagement by the care management team. Appropriate care team members and provider have been notified via electronic communication.   Shaneece Stockburger [facility] Guide, Embedded Care Coordination [facility] Management

## 2020-11-26 ENCOUNTER — Telehealth: Payer: Medicare PPO

## 2020-11-26 ENCOUNTER — Ambulatory Visit: Payer: Self-pay

## 2020-11-26 NOTE — Chronic Care Management (AMB) (Addendum)
  Care Management   Outreach Note  11/26/2020 Name: Richard Sampson MRN: [id number] DOB: 21-Nov-1945  Referred by: [name], MD Reason for referral : Care Coordination (Initial RN CM Call )   Successful contact was made with the patient to discuss care management and care coordination services. Patient declines engagement at this time. [name] feels he has a good healthcare team through Dr. Grayling Congress and the [place]. He prefers not have further involvement with additional health care team members at this time but may consider in the future at which time he will notify Dr. Allyne Gee.   Follow Up Plan: No further follow up required  Delsa Sale, RN, BSN, CCM Care Management Coordinator [facility] Care Management/[facility]  Direct Phone: [phone]

## 2021-02-07 ENCOUNTER — Ambulatory Visit (INDEPENDENT_AMBULATORY_CARE_PROVIDER_SITE_OTHER): Payer: Medicare PPO | Admitting: Internal Medicine

## 2021-02-07 ENCOUNTER — Encounter: Payer: Self-pay | Admitting: Internal Medicine

## 2021-02-07 ENCOUNTER — Other Ambulatory Visit: Payer: Self-pay

## 2021-02-07 VITALS — BP 116/82 | HR 73 | Temp 98.2°F | Ht 68.4 in | Wt 216.2 lb

## 2021-02-07 DIAGNOSIS — F17200 Nicotine dependence, unspecified, uncomplicated: Secondary | ICD-10-CM | POA: Diagnosis not present

## 2021-02-07 DIAGNOSIS — G8929 Other chronic pain: Secondary | ICD-10-CM | POA: Diagnosis not present

## 2021-02-07 DIAGNOSIS — M25512 Pain in left shoulder: Secondary | ICD-10-CM | POA: Diagnosis not present

## 2021-02-07 DIAGNOSIS — I480 Paroxysmal atrial fibrillation: Secondary | ICD-10-CM

## 2021-02-07 DIAGNOSIS — I700 Atherosclerosis of aorta: Secondary | ICD-10-CM | POA: Diagnosis not present

## 2021-02-07 DIAGNOSIS — R7309 Other abnormal glucose: Secondary | ICD-10-CM

## 2021-02-07 DIAGNOSIS — E6609 Other obesity due to excess calories: Secondary | ICD-10-CM | POA: Diagnosis not present

## 2021-02-07 DIAGNOSIS — I119 Hypertensive heart disease without heart failure: Secondary | ICD-10-CM | POA: Diagnosis not present

## 2021-02-07 DIAGNOSIS — Z6832 Body mass index (BMI) 32.0-32.9, adult: Secondary | ICD-10-CM

## 2021-02-07 NOTE — Patient Instructions (Signed)
Please get copy of colonoscopy report from the [place].

## 2021-02-07 NOTE — Progress Notes (Signed)
I,[name],acting as a Education administrator for [name], MD.,have documented all relevant documentation on the behalf of [name], MD,as directed by  [name], MD while in the presence of [name], MD.  This visit occurred during the SARS-CoV-2 public health emergency.  Safety protocols were in place, including screening questions prior to the visit, additional usage of staff PPE, and extensive cleaning of exam room while observing appropriate contact time as indicated for disinfecting solutions.  Subjective:     Patient ID: Richard Sampson , male    DOB: Feb 10, 1946 , 75 y.o.   MRN: [id number]   Chief Complaint  Patient presents with   Hypertension    HPI  He is here today for BP check. He reports compliance with meds. He denies having any chest pain, shortness of breath and palpitations. He is also followed at the [place]. Currently, he is receiving physical therapy for left shoulder pain.   Hypertension This is a chronic problem. The current episode started more than 1 year ago. The problem has been gradually improving since onset. The problem is controlled. Pertinent negatives include no blurred vision, chest pain, palpitations or shortness of breath. The current treatment provides moderate improvement. Compliance problems include exercise.     Past Medical History:  Diagnosis Date   A-fib ([place]) 07/17/2014   High cholesterol    Hypertension    Pneumonia      Family History  Problem Relation Age of Onset   Thyroid nodules Mother    Dementia Mother    Heart failure Father    Cancer Father    Kidney disease Son    Diabetes Brother    Cancer Brother    Heart attack Neg Hx    Stroke Neg Hx      Current Outpatient Medications:    amLODipine (NORVASC) 10 MG tablet, Take 10 mg by mouth daily., Disp: , Rfl:    ammonium lactate (LAC-HYDRIN) 12 % lotion, Apply 1 application topically 2 (two) times daily., Disp: , Rfl:    camphor-menthol (SARNA) lotion, Apply 1  application topically as needed for itching., Disp: , Rfl:    cholecalciferol (VITAMIN D3) 10 MCG (400 UNIT) TABS tablet, Take 400 Units by mouth., Disp: , Rfl:    ferrous sulfate 325 (65 FE) MG tablet, Take 325 mg by mouth daily with breakfast., Disp: , Rfl:    potassium chloride SA (K-DUR,KLOR-CON) 20 MEQ tablet, Take 40 mEq by mouth 2 (two) times daily. Morning and evening, Disp: , Rfl:    triamterene-hydrochlorothiazide (MAXZIDE) 75-50 MG per tablet, Take 0.5 tablets by mouth every evening. , Disp: , Rfl:    apixaban (ELIQUIS) 5 MG TABS tablet, Take 1 tablet by mouth 2 times a day. Need to schedule appt with cardiologist for refills., Disp: 60 tablet, Rfl: 0   atorvastatin (LIPITOR) 80 MG tablet, Take 1 tablet (80 mg total) by mouth daily. (Patient taking differently: No sig reported), Disp: 90 tablet, Rfl: 3   diclofenac sodium (VOLTAREN) 1 % GEL, Apply 2 g topically 3 (three) times daily as needed. (Patient not taking: Reported on 02/07/2021), Disp: , Rfl:    diltiazem (TIAZAC) 180 MG 24 hr capsule, Take 180 mg by mouth 2 (two) times daily. (Patient not taking: Reported on 02/07/2021), Disp: , Rfl: 0   Allergies  Allergen Reactions   Ace Inhibitors Swelling    Throat swelling; pt was on lisinopril   Angiotensin Receptor Blockers Swelling    Cross reactivity in  known ACE I allergy   Lisinopril Swelling    Throat swelling   Losartan Other (See Comments)    Doctor told pt not to take     Review of Systems  Constitutional: Negative.   Eyes:  Negative for blurred vision.  Respiratory: Negative.  Negative for shortness of breath.   Cardiovascular: Negative.  Negative for chest pain and palpitations.  Gastrointestinal: Negative.   Musculoskeletal:  Positive for arthralgias.       He has left shoulder pain - chronic. Dull, throbbing. No pain with movement. Not hurting at this time. Two days ago, his shoulder felt very tight. Denies LUE weakness/paresthesias.   Psychiatric/Behavioral:  Negative.    All other systems reviewed and are negative.   Today's Vitals   02/07/21 0842  BP: 116/82  Pulse: 73  Temp: 98.2 F (36.8 C)  Weight: 216 lb 3.2 oz (98.1 kg)  Height: 5' 8.4" (1.737 m)  PainSc: 2   PainLoc: Back   Body mass index is 32.49 kg/m.  Wt Readings from Last 3 Encounters:  02/07/21 216 lb 3.2 oz (98.1 kg)  11/01/20 222 lb 3.2 oz (100.8 kg)  10/20/20 221 lb (100.2 kg)    BP Readings from Last 3 Encounters:  02/07/21 116/82  11/01/20 132/78  10/20/20 130/70    Objective:  Physical Exam Vitals and nursing note reviewed.  Constitutional:      Appearance: Normal appearance. He is obese.  HENT:     Nose:     Comments: Masked     Mouth/Throat:     Comments: Masked  Eyes:     Extraocular Movements: Extraocular movements intact.  Cardiovascular:     Rate and Rhythm: Normal rate and regular rhythm.     Heart sounds: Normal heart sounds.  Pulmonary:     Effort: Pulmonary effort is normal.     Breath sounds: Normal breath sounds.  Skin:    General: Skin is warm.  Neurological:     General: No focal deficit present.     Mental Status: He is alert.  Psychiatric:        Mood and Affect: Mood normal.        Assessment And Plan:     1. Hypertensive heart disease without heart failure Comments: Chronic, well controlled. He is encouraged to follow low sodium diet. He will rto in September 2022 for his next physical exam.  - BMP8+EGFR  2. Paroxysmal atrial fibrillation (HCC) Comments: Chronic, currently in sinus rhythm. He is rate controlled and proper anticoagulated.   3. Chronic left shoulder pain Comments: He is currently in PT at the [place]. Advised to apply topical Voltaren gel to affected area two to three times daily as needed.   4. Other abnormal glucose Comments: His a1c has been elevated in the past. I will recheck this today. Advised to limit his intake of sweetened beverages, including diet drinks.  - Hemoglobin A1c  5. Atherosclerosis  of aorta ([place]) Comments: He is encouraged to follow heart healthy diet. Importance of statin compliance was stressed to the patient.   6. Class 1 obesity due to excess calories with serious comorbidity and body mass index (BMI) of 32.0 to 32.9 in adult Comments: He is encouraged to strive for BMi less than 30 to decrease cardiac risk.  Advised to aim for at least 150 minutes of exercise per week.   7. Tobacco use disorder Smoking cessation instruction/counseling given:  counseled patient on the dangers of tobacco use, advised patient to  stop smoking, and reviewed strategies to maximize success  - CT CHEST LUNG CA SCREEN LOW DOSE W/O CM; Future   Patient was given opportunity to ask questions. Patient verbalized understanding of the plan and was able to repeat key elements of the plan. All questions were answered to their satisfaction.   I, [name], MD, have reviewed all documentation for this visit. The documentation on 02/07/21 for the exam, diagnosis, procedures, and orders are all accurate and complete.   IF YOU HAVE BEEN REFERRED TO A SPECIALIST, IT MAY TAKE 1-2 WEEKS TO SCHEDULE/PROCESS THE REFERRAL. IF YOU HAVE NOT HEARD FROM US/SPECIALIST IN TWO WEEKS, PLEASE GIVE [place] A CALL AT [phone].   THE PATIENT IS ENCOURAGED TO PRACTICE SOCIAL DISTANCING DUE TO THE COVID-19 PANDEMIC.

## 2021-02-08 LAB — BMP8+EGFR
BUN/Creatinine Ratio: 13 (ref 10–24)
BUN: 17 mg/dL (ref 8–27)
CO2: 26 mmol/L (ref 20–29)
Calcium: 9.3 mg/dL (ref 8.6–10.2)
Chloride: 102 mmol/L (ref 96–106)
Creatinine, Ser: 1.26 mg/dL (ref 0.76–1.27)
Glucose: 91 mg/dL (ref 65–99)
Potassium: 3.7 mmol/L (ref 3.5–5.2)
Sodium: 141 mmol/L (ref 134–144)
eGFR: 61 mL/min/{1.73_m2} (ref >59)

## 2021-02-08 LAB — HEMOGLOBIN A1C
Est. average glucose Bld gHb Est-mCnc: 114 mg/dL
Hgb A1c MFr Bld: 5.6 % (ref 4.8–5.6)

## 2021-02-16 ENCOUNTER — Ambulatory Visit (HOSPITAL_BASED_OUTPATIENT_CLINIC_OR_DEPARTMENT_OTHER)
Admission: RE | Admit: 2021-02-16 | Discharge: 2021-02-16 | Disposition: A | Discharge status: Home / Self Care | Payer: Medicare PPO | Source: Ambulatory Visit | Attending: Internal Medicine | Admitting: Internal Medicine

## 2021-02-16 ENCOUNTER — Other Ambulatory Visit: Payer: Self-pay

## 2021-02-16 DIAGNOSIS — F17210 Nicotine dependence, cigarettes, uncomplicated: Secondary | ICD-10-CM | POA: Diagnosis not present

## 2021-02-16 DIAGNOSIS — Z122 Encounter for screening for malignant neoplasm of respiratory organs: Secondary | ICD-10-CM | POA: Insufficient documentation

## 2021-02-16 DIAGNOSIS — F17200 Nicotine dependence, unspecified, uncomplicated: Secondary | ICD-10-CM

## 2021-02-25 ENCOUNTER — Other Ambulatory Visit: Payer: Self-pay

## 2021-04-19 ENCOUNTER — Other Ambulatory Visit: Payer: Self-pay

## 2021-04-19 ENCOUNTER — Encounter (HOSPITAL_BASED_OUTPATIENT_CLINIC_OR_DEPARTMENT_OTHER): Payer: Self-pay

## 2021-04-19 ENCOUNTER — Emergency Department (HOSPITAL_BASED_OUTPATIENT_CLINIC_OR_DEPARTMENT_OTHER)
Admission: EM | Admit: 2021-04-19 | Discharge: 2021-04-19 | Disposition: A | Discharge status: Home / Self Care | Payer: Medicare PPO | Attending: Emergency Medicine | Admitting: Emergency Medicine

## 2021-04-19 ENCOUNTER — Emergency Department (HOSPITAL_BASED_OUTPATIENT_CLINIC_OR_DEPARTMENT_OTHER): Payer: Medicare PPO

## 2021-04-19 DIAGNOSIS — F17210 Nicotine dependence, cigarettes, uncomplicated: Secondary | ICD-10-CM | POA: Diagnosis not present

## 2021-04-19 DIAGNOSIS — R059 Cough, unspecified: Secondary | ICD-10-CM | POA: Diagnosis not present

## 2021-04-19 DIAGNOSIS — U071 COVID-19: Secondary | ICD-10-CM | POA: Insufficient documentation

## 2021-04-19 DIAGNOSIS — Z79899 Other long term (current) drug therapy: Secondary | ICD-10-CM | POA: Insufficient documentation

## 2021-04-19 DIAGNOSIS — I10 Essential (primary) hypertension: Secondary | ICD-10-CM | POA: Diagnosis not present

## 2021-04-19 DIAGNOSIS — Z7901 Long term (current) use of anticoagulants: Secondary | ICD-10-CM | POA: Diagnosis not present

## 2021-04-19 DIAGNOSIS — I4891 Unspecified atrial fibrillation: Secondary | ICD-10-CM | POA: Insufficient documentation

## 2021-04-19 DIAGNOSIS — R079 Chest pain, unspecified: Secondary | ICD-10-CM | POA: Diagnosis not present

## 2021-04-19 DIAGNOSIS — R519 Headache, unspecified: Secondary | ICD-10-CM | POA: Diagnosis present

## 2021-04-19 DIAGNOSIS — R0789 Other chest pain: Secondary | ICD-10-CM | POA: Diagnosis not present

## 2021-04-19 LAB — RESP PANEL BY RT-PCR (FLU A&B, COVID) ARPGX2
Influenza A by PCR: NEGATIVE
Influenza B by PCR: NEGATIVE
SARS Coronavirus 2 by RT PCR: POSITIVE — AB

## 2021-04-19 LAB — BASIC METABOLIC PANEL WITH GFR
Anion gap: 8 (ref 5–15)
BUN: 17 mg/dL (ref 8–23)
CO2: 27 mmol/L (ref 22–32)
Calcium: 9 mg/dL (ref 8.9–10.3)
Chloride: 103 mmol/L (ref 98–111)
Creatinine, Ser: 1.18 mg/dL (ref 0.61–1.24)
GFR, Estimated: >60 mL/min
Glucose, Bld: 93 mg/dL (ref 70–99)
Potassium: 3 mmol/L — ABNORMAL LOW (ref 3.5–5.1)
Sodium: 138 mmol/L (ref 135–145)

## 2021-04-19 LAB — CBC
HCT: 39.1 % (ref 39.0–52.0)
Hemoglobin: 13.2 g/dL (ref 13.0–17.0)
MCH: 31.4 pg (ref 26.0–34.0)
MCHC: 33.8 g/dL (ref 30.0–36.0)
MCV: 93.1 fL (ref 80.0–100.0)
Platelets: 210 K/uL (ref 150–400)
RBC: 4.2 MIL/uL — ABNORMAL LOW (ref 4.22–5.81)
RDW: 14.6 % (ref 11.5–15.5)
WBC: 4.4 K/uL (ref 4.0–10.5)
nRBC: 0 % (ref 0.0–0.2)

## 2021-04-19 LAB — TROPONIN I (HIGH SENSITIVITY): Troponin I (High Sensitivity): 2 ng/L

## 2021-04-19 MED ORDER — POTASSIUM CHLORIDE CRYS ER 20 MEQ PO TBCR
40.0000 meq | EXTENDED_RELEASE_TABLET | Freq: Once | ORAL | Status: AC
  Administered 2021-04-19: 40 meq via ORAL
  Filled 2021-04-19: qty 2

## 2021-04-19 NOTE — ED Triage Notes (Signed)
Pt c/o CP x today-cough, chills, HA x 2-3 days while [place]-NAD-steady gait

## 2021-04-19 NOTE — ED Provider Notes (Signed)
[facility] EMERGENCY DEPARTMENT Provider Note   CSN: [id number] Arrival date & time: 04/19/21  1918     History Chief Complaint  Patient presents with   Chest Pain    Richard Sampson is a 75 y.o. male.  He is complaining of a generalized headache for a couple of days while he was traveling in the [place].  Possibly has had some chills.  Today he started with some chest discomfort and nonproductive cough.  He is on blood thinners for A. fib.  No nausea vomiting or diarrhea.  No sick contacts.  He is COVID boosted x2  The history is provided by the patient.  Chest Pain Pain location:  Substernal area and L chest Pain quality: pressure   Pain radiates to:  Does not radiate Pain severity:  Moderate Onset quality:  Gradual Duration:  6 hours Timing:  Intermittent Progression:  Waxing and waning Chronicity:  New Context comment:  Coughing Relieved by:  None tried Worsened by:  Coughing Ineffective treatments:  None tried Associated symptoms: cough and headache   Associated symptoms: no abdominal pain, no back pain, no diaphoresis, no fever, no nausea, no shortness of breath and no vomiting   Risk factors: no diabetes mellitus       Past Medical History:  Diagnosis Date   A-fib (HCC) 07/17/2014   High cholesterol    Hypertension    Pneumonia     Patient Active Problem List   Diagnosis Date Noted   Angioedema 10/20/2015   GI bleed 10/20/2015   GERD (gastroesophageal reflux disease) 10/20/2015   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   Tobacco abuse 10/18/2015   Acute kidney injury (HCC) 07/18/2014   PAF 2015 07/18/2014   Chest pain with moderate risk of acute coronary syndrome 07/17/2014   HLD (hyperlipidemia) 07/17/2014   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   Hypokalemia 03/16/2013   Angio-edema-secondary to ACE 03/16/2013    Past Surgical History:  Procedure  Laterality Date   left wrist surgery Left 1966   glass cut ligaments, surgical repair   TONSILLECTOMY  1955   VASECTOMY  1980       Family History  Problem Relation Age of Onset   Thyroid nodules Mother    Dementia Mother    Heart failure Father    Cancer Father    Kidney disease Son    Diabetes Brother    Cancer Brother    Heart attack Neg Hx    Stroke Neg Hx     Social History   Tobacco Use   Smoking status: Every Day    Packs/day: 1.00    Years: 20.00    Pack years: 20.00    Types: Cigars, Cigarettes    Start date: 1965    Last attempt to quit: 08/18/2018    Years since quitting: 2.6   Smokeless tobacco: Never   Tobacco comments:    Started at age 75 - 1ppdx 73, quit x 5; pipex5 years, cigarsx15  Vaping Use   Vaping Use: Never used  Substance Use Topics   Alcohol use: Yes    Comment: 1-2 oz per month    Drug use: Not Currently    Home Medications Prior to Admission medications   Medication Sig Start Date End Date Taking? Authorizing Provider  amLODipine (NORVASC) 10 MG tablet Take 10 mg by mouth daily.    [provider]  ammonium lactate (LAC-HYDRIN) 12 % lotion Apply 1  application topically 2 (two) times daily.    [provider]  apixaban (ELIQUIS) 5 MG TABS tablet Take 1 tablet by mouth 2 times a day. Need to schedule appt with cardiologist for refills. 12/28/16   Duke Salvia, MD  atorvastatin (LIPITOR) 80 MG tablet Take 1 tablet (80 mg total) by mouth daily. Patient taking differently: Take 80 mg by mouth daily. Pt states 40mg  daily 03/24/20   05/24/20, MD  camphor-menthol [facility]) lotion Apply 1 application topically as needed for itching.    [provider]  cholecalciferol (VITAMIN D3) 10 MCG (400 UNIT) TABS tablet Take 400 Units by mouth.    [provider]  diclofenac sodium (VOLTAREN) 1 % GEL Apply 2 g topically 3 (three) times daily as needed. Patient not taking: Reported on 02/07/2021    [provider]  diltiazem (TIAZAC) 180 MG 24 hr capsule Take 180 mg by mouth 2 (two) times daily. Patient not taking: Reported on 02/07/2021 08/23/15   [provider]  ferrous sulfate 325 (65 FE) MG tablet Take 325 mg by mouth daily with breakfast.    [provider]  potassium chloride SA (K-DUR,KLOR-CON) 20 MEQ tablet Take 40 mEq by mouth 2 (two) times daily. Morning and evening    [provider]  triamterene-hydrochlorothiazide (MAXZIDE) 75-50 MG per tablet Take 0.5 tablets by mouth every evening.     [provider]    Allergies    Ace inhibitors, Angiotensin receptor blockers, Lisinopril, and Losartan  Review of Systems   Review of Systems  Constitutional:  Negative for diaphoresis and fever.  HENT:  Negative for sore throat.   Eyes:  Negative for visual disturbance.  Respiratory:  Positive for cough. Negative for shortness of breath.   Cardiovascular:  Positive for chest pain.  Gastrointestinal:  Negative for abdominal pain, nausea and vomiting.  Genitourinary:  Negative for dysuria.  Musculoskeletal:  Negative for back pain.  Skin:  Negative for rash.  Neurological:  Positive for headaches.   Physical Exam Updated Vital Signs BP (!) 146/95 (BP Location: Left Arm)   Pulse 91   Temp 98.9 F (37.2 C) (Oral)   Resp 20   Ht 5\' 10"  (1.778 m)   Wt 99.3 kg   SpO2 100%   BMI 31.42 kg/m   Physical Exam Vitals and nursing note reviewed.  Constitutional:      Appearance: He is well-developed.  HENT:     Head: Normocephalic and atraumatic.  Eyes:     Conjunctiva/sclera: Conjunctivae normal.  Cardiovascular:     Rate and Rhythm: Normal rate and regular rhythm.     Heart sounds: Normal heart sounds. No murmur heard. Pulmonary:     Effort: Pulmonary effort is normal. No respiratory distress.     Breath sounds: Normal breath sounds.  Abdominal:     Palpations: Abdomen is soft.     Tenderness: There is no abdominal tenderness.   Musculoskeletal:        General: Normal range of motion.     Cervical back: Neck supple.     Right lower leg: No tenderness.     Left lower leg: No tenderness.  Skin:    General: Skin is warm and dry.  Neurological:     General: No focal deficit present.     Mental Status: He is alert.    ED Results / Procedures / Treatments   Labs (all labs ordered are listed, but only abnormal results are displayed) Labs  Reviewed  RESP PANEL BY RT-PCR (FLU A&B, COVID) ARPGX2 - Abnormal; Notable for the following components:      Result Value   SARS Coronavirus 2 by RT PCR POSITIVE (*)    All other components within normal limits  BASIC METABOLIC PANEL - Abnormal; Notable for the following components:   Potassium 3.0 (*)    All other components within normal limits  CBC - Abnormal; Notable for the following components:   RBC 4.20 (*)    All other components within normal limits  TROPONIN I (HIGH SENSITIVITY)  TROPONIN I (HIGH SENSITIVITY)    EKG EKG Interpretation  Date/Time:  Tuesday April 19 2021 19:26:20 EDT Ventricular Rate:  91 PR Interval:  180 QRS Duration: 110 QT Interval:  392 QTC Calculation: 482 R Axis:   -57 Text Interpretation: Sinus rhythm with Premature atrial complexes Possible Left atrial enlargement Pulmonary disease pattern Left anterior fascicular block Minimal voltage criteria for LVH, may be normal variant ( Cornell product ) Prolonged QT Abnormal ECG No significant change since prior 2/22 Confirmed by Meridee ScoreButler, Nelly Scriven [phone]([id number]) on 04/19/2021 7:40:42 PM  Radiology DG Chest Portable 1 View  Result Date: 04/19/2021 CLINICAL DATA:  Cough, chest pain EXAM: PORTABLE CHEST 1 VIEW COMPARISON:  Chest x-ray 09/21/2020, CT chest 02/16/2021 FINDINGS: The heart and mediastinal contours are unchanged. No focal consolidation. No pulmonary edema. No pleural effusion. No pneumothorax. No acute osseous abnormality. IMPRESSION: No active disease. Electronically Signed   By: Tish FredericksonMorgane   Naveau M.D.   On: 04/19/2021 20:04    Procedures Procedures   Medications Ordered in ED Medications  potassium chloride SA (KLOR-CON) CR tablet 40 mEq (40 mEq Oral Given 04/19/21 2139)    ED Course  I have reviewed the triage vital signs and the nursing notes.  Pertinent labs & imaging results that were available during my care of the patient were reviewed by me and considered in my medical decision making (see chart for details).  Clinical Course as of 04/20/21 1050  Tue Apr 19, 2021  2002 Chest x-ray interpreted by me as no acute infiltrates.  Awaiting radiology reading. [MB]    Clinical Course User Index [MB] Terrilee FilesButler, Cailean Heacock C, MD   MDM Rules/Calculators/A&P                          Richard Sampson was evaluated in Emergency Department on 04/20/2021 for the symptoms described in the history of present illness. He was evaluated in the context of the global COVID-19 pandemic, which necessitated consideration that the patient might be at risk for infection with the SARS-CoV-2 virus that causes COVID-19. Institutional protocols and algorithms that pertain to the evaluation of patients at risk for COVID-19 are in a state of rapid change based on information released by regulatory bodies including the CDC and federal and state organizations. These policies and algorithms were followed during the patient's care in the ED.  This patient complains of chest pain shortness of breath cough runny nose ; this involves an extensive number of treatment Options and is a complaint that carries with it a high risk of complications and Morbidity. The differential includes COVID, pneumonia, viral syndrome, ACS, pneumothorax, PE  I ordered, reviewed and interpreted labs, which included CBC with normal white count normal hemoglobin, chemistries normal other than low potassium supplemented, troponin unremarkable do not feel needs to.  COVID testing positive I ordered medication oral potassium I ordered  imaging studies which included chest x-ray  and I independently    visualized and interpreted imaging which showed no acute infiltrates Previous records obtained and reviewed in epic no recent admissions  After the interventions stated above, I reevaluated the patient and found patient to be hemodynamically stable and oxygenating well.  Reviewed with pharmacy patient's medications and unfortunately is not a candidate for paxlovid.  Recommended close follow-up with [facility] at [facility] for further management.  Return instructions discussed   Final Clinical Impression(s) / ED Diagnoses Final diagnoses:  COVID-19 virus infection  Nonspecific chest pain    Rx / DC Orders ED Discharge Orders     None        Terrilee Files, MD 04/20/21 1053

## 2021-04-19 NOTE — Discharge Instructions (Addendum)
You are seen in the emergency department for cough and chest pain along with other symptoms such as headache.  Your blood work did not show any evidence of heart attack and your chest x-ray did not show any pneumonia.  You did test positive for COVID.  Unfortunately you are not a candidate for the monoclonal antibodies due to some of your medications that you are on.  Please isolate and rest, drink plenty of fluids.  Return to the emergency department if any worsening or concerning symptoms

## 2021-04-21 ENCOUNTER — Telehealth: Payer: Self-pay

## 2021-04-21 NOTE — Telephone Encounter (Signed)
Called pt to check on status of health, pt stated he slowly but surely getting better. Ed f/u apt offered he did not feel the need to have one sch. He has an phys coming up 05/10/2021. Pt reassured, if apt needed, he could give the office a call back & we could get him in before phys.

## 2021-05-09 DIAGNOSIS — I4891 Unspecified atrial fibrillation: Secondary | ICD-10-CM | POA: Diagnosis not present

## 2021-05-09 DIAGNOSIS — F17210 Nicotine dependence, cigarettes, uncomplicated: Secondary | ICD-10-CM | POA: Diagnosis not present

## 2021-05-09 DIAGNOSIS — I2510 Atherosclerotic heart disease of native coronary artery without angina pectoris: Secondary | ICD-10-CM | POA: Diagnosis not present

## 2021-05-09 DIAGNOSIS — G8929 Other chronic pain: Secondary | ICD-10-CM | POA: Diagnosis not present

## 2021-05-09 DIAGNOSIS — E669 Obesity, unspecified: Secondary | ICD-10-CM | POA: Diagnosis not present

## 2021-05-09 DIAGNOSIS — I10 Essential (primary) hypertension: Secondary | ICD-10-CM | POA: Diagnosis not present

## 2021-05-09 DIAGNOSIS — I951 Orthostatic hypotension: Secondary | ICD-10-CM | POA: Diagnosis not present

## 2021-05-09 DIAGNOSIS — D6869 Other thrombophilia: Secondary | ICD-10-CM | POA: Diagnosis not present

## 2021-05-09 DIAGNOSIS — E785 Hyperlipidemia, unspecified: Secondary | ICD-10-CM | POA: Diagnosis not present

## 2021-05-10 ENCOUNTER — Encounter: Payer: Self-pay | Admitting: Internal Medicine

## 2021-05-10 ENCOUNTER — Other Ambulatory Visit: Payer: Self-pay

## 2021-05-10 ENCOUNTER — Ambulatory Visit: Payer: TRICARE For Life (TFL) | Admitting: Internal Medicine

## 2021-05-10 VITALS — BP 128/84 | HR 83 | Temp 98.2°F | Ht 69.0 in | Wt 215.2 lb

## 2021-05-10 DIAGNOSIS — G8929 Other chronic pain: Secondary | ICD-10-CM

## 2021-05-10 DIAGNOSIS — M25512 Pain in left shoulder: Secondary | ICD-10-CM

## 2021-05-10 DIAGNOSIS — Z8616 Personal history of COVID-19: Secondary | ICD-10-CM

## 2021-05-10 DIAGNOSIS — E6609 Other obesity due to excess calories: Secondary | ICD-10-CM

## 2021-05-10 DIAGNOSIS — E66811 Obesity, class 1: Secondary | ICD-10-CM | POA: Insufficient documentation

## 2021-05-10 DIAGNOSIS — I119 Hypertensive heart disease without heart failure: Secondary | ICD-10-CM

## 2021-05-10 DIAGNOSIS — R351 Nocturia: Secondary | ICD-10-CM | POA: Diagnosis not present

## 2021-05-10 DIAGNOSIS — R7309 Other abnormal glucose: Secondary | ICD-10-CM | POA: Insufficient documentation

## 2021-05-10 DIAGNOSIS — Z23 Encounter for immunization: Secondary | ICD-10-CM

## 2021-05-10 DIAGNOSIS — I700 Atherosclerosis of aorta: Secondary | ICD-10-CM

## 2021-05-10 DIAGNOSIS — Z0000 Encounter for general adult medical examination without abnormal findings: Secondary | ICD-10-CM

## 2021-05-10 DIAGNOSIS — Z6831 Body mass index (BMI) 31.0-31.9, adult: Secondary | ICD-10-CM | POA: Insufficient documentation

## 2021-05-10 DIAGNOSIS — R9431 Abnormal electrocardiogram [ECG] [EKG]: Secondary | ICD-10-CM

## 2021-05-10 DIAGNOSIS — I480 Paroxysmal atrial fibrillation: Secondary | ICD-10-CM

## 2021-05-10 LAB — CMP14+EGFR
ALT: 14 IU/L (ref 0–44)
AST: 17 IU/L (ref 0–40)
Albumin/Globulin Ratio: 1.5 (ref 1.2–2.2)
Albumin: 4.3 g/dL (ref 3.7–4.7)
Alkaline Phosphatase: 77 IU/L (ref 44–121)
BUN/Creatinine Ratio: 11 (ref 10–24)
BUN: 14 mg/dL (ref 8–27)
Bilirubin Total: 0.3 mg/dL (ref 0.0–1.2)
CO2: 25 mmol/L (ref 20–29)
Calcium: 10 mg/dL (ref 8.6–10.2)
Chloride: 103 mmol/L (ref 96–106)
Creatinine, Ser: 1.23 mg/dL (ref 0.76–1.27)
Globulin, Total: 2.8 g/dL (ref 1.5–4.5)
Glucose: 99 mg/dL (ref 65–99)
Potassium: 4.2 mmol/L (ref 3.5–5.2)
Sodium: 141 mmol/L (ref 134–144)
Total Protein: 7.1 g/dL (ref 6.0–8.5)
eGFR: 62 mL/min/{1.73_m2} (ref >59)

## 2021-05-10 LAB — POCT URINALYSIS DIPSTICK
Bilirubin, UA: NEGATIVE
Blood, UA: NEGATIVE
Glucose, UA: NEGATIVE
Ketones, UA: NEGATIVE
Leukocytes, UA: NEGATIVE
Nitrite, UA: NEGATIVE
Protein, UA: NEGATIVE
Spec Grav, UA: 1.02 (ref 1.010–1.025)
Urobilinogen, UA: 0.2 E.U./dL
pH, UA: 7 (ref 5.0–8.0)

## 2021-05-10 LAB — POCT UA - MICROALBUMIN
Albumin/Creatinine Ratio, Urine, POC: 30
Creatinine, POC: 200 mg/dL
Microalbumin Ur, POC: 10 mg/L

## 2021-05-10 NOTE — Patient Instructions (Signed)
Health Maintenance After Age 73 After age 73, you are at a higher risk for certain long-term diseases and infections as well as injuries from falls. Falls are a major cause of broken bones and head injuries in people who are older than age 73. Getting regular preventive care can help to keep you healthy and well. Preventive care includes getting regular testing and making lifestyle changes as recommended by your health care provider. Talk with your health care provider about: Which screenings and tests you should have. A screening is a test that checks for a disease when you have no symptoms. A diet and exercise plan that is right for you. What should I know about screenings and tests to prevent falls? Screening and testing are the best ways to find a health problem early. Early diagnosis and treatment give you the best chance of managing medical conditions that are common after age 73. Certain conditions and lifestyle choices may make you more likely to have a fall. Your health care provider may recommend: Regular vision checks. Poor vision and conditions such as cataracts can make you more likely to have a fall. If you wear glasses, make sure to get your prescription updated if your vision changes. Medicine review. Work with your health care provider to regularly review all of the medicines you are taking, including over-the-counter medicines. Ask your health care provider about any side effects that may make you more likely to have a fall. Tell your health care provider if any medicines that you take make you feel dizzy or sleepy. Osteoporosis screening. Osteoporosis is a condition that causes the bones to get weaker. This can make the bones weak and cause them to break more easily. Blood pressure screening. Blood pressure changes and medicines to control blood pressure can make you feel dizzy. Strength and balance checks. Your health care provider may recommend certain tests to check your strength and  balance while standing, walking, or changing positions. Foot health exam. Foot pain and numbness, as well as not wearing proper footwear, can make you more likely to have a fall. Depression screening. You may be more likely to have a fall if you have a fear of falling, feel emotionally low, or feel unable to do activities that you used to do. Alcohol use screening. Using too much alcohol can affect your balance and may make you more likely to have a fall. What actions can I take to lower my risk of falls? General instructions Talk with your health care provider about your risks for falling. Tell your health care provider if: You fall. Be sure to tell your health care provider about all falls, even ones that seem minor. You feel dizzy, sleepy, or off-balance. Take over-the-counter and prescription medicines only as told by your health care provider. These include any supplements. Eat a healthy diet and maintain a healthy weight. A healthy diet includes low-fat dairy products, low-fat (lean) meats, and fiber from whole grains, beans, and lots of fruits and vegetables. Home safety Remove any tripping hazards, such as rugs, cords, and clutter. Install safety equipment such as grab bars in bathrooms and safety rails on stairs. Keep rooms and walkways well-lit. Activity  Follow a regular exercise program to stay fit. This will help you maintain your balance. Ask your health care provider what types of exercise are appropriate for you. If you need a cane or walker, use it as recommended by your health care provider. Wear supportive shoes that have nonskid soles. Lifestyle Do not   drink alcohol if your health care provider tells you not to drink. If you drink alcohol, limit how much you have: 0-1 drink a day for women. 0-2 drinks a day for men. Be aware of how much alcohol is in your drink. In the [place] one drink equals one typical bottle of beer (12 oz), one-half glass of wine (5 oz), or one shot of  hard liquor (1 oz). Do not use any products that contain nicotine or tobacco, such as cigarettes and e-cigarettes. If you need help quitting, ask your health care provider. Summary Having a healthy lifestyle and getting preventive care can help to protect your health and wellness after age 73. Screening and testing are the best way to find a health problem early and help you avoid having a fall. Early diagnosis and treatment give you the best chance for managing medical conditions that are more common for people who are older than age 73. Falls are a major cause of broken bones and head injuries in people who are older than age 73. Take precautions to prevent a fall at home. Work with your health care provider to learn what changes you can make to improve your health and wellness and to prevent falls. This information is not intended to replace advice given to you by your health care provider. Make sure you discuss any questions you have with your health care provider. Document Revised: 10/15/2020 Document Reviewed: 07/23/2020 Elsevier Patient Education  2022 [employer]  

## 2021-05-10 NOTE — Progress Notes (Signed)
I,[name],acting as a Education administrator for Richard Greenland, MD.,have documented all relevant documentation on the behalf of Richard Greenland, MD,as directed by  Richard Greenland, MD while in the presence of Richard Greenland, MD.  This visit occurred during the SARS-CoV-2 public health emergency.  Safety protocols were in place, including screening questions prior to the visit, additional usage of staff PPE, and extensive cleaning of exam room while observing appropriate contact time as indicated for disinfecting solutions.  Subjective:     Patient ID: Richard Sampson , male    DOB: December 07, 1945 , 75 y.o.   MRN: [id number]   Chief Complaint  Patient presents with   Annual Exam   Hypertension    HPI  He is here today for a full physical exam. He has no specific concerns or complaints at this time. He was last seen in ER on 8/30 for further evaluation of chest pain/sob/runny nose. He was diagnosed with COVID. He feels his sx have fully resolved.   He is happy to report that he is exercising regularly. He lifts weights three days per week and cycles three days per week.   Hypertension This is a chronic problem. The current episode started more than 1 year ago. The problem has been gradually improving since onset. The problem is controlled. Pertinent negatives include no blurred vision, chest pain, palpitations or shortness of breath. Risk factors for coronary artery disease include dyslipidemia, obesity, male gender and sedentary lifestyle. Past treatments include calcium channel blockers and diuretics. The current treatment provides moderate improvement. Compliance problems include exercise.     Past Medical History:  Diagnosis Date   A-fib (Richard Sampson) 07/17/2014   High cholesterol    Hypertension    Pneumonia      Family History  Problem Relation Age of Onset   Thyroid nodules Mother    Dementia Mother    Heart failure Father    Cancer Father    Kidney disease Son    Diabetes Brother     Cancer Brother    Heart attack Neg Hx    Stroke Neg Hx      Current Outpatient Medications:    amLODipine (NORVASC) 10 MG tablet, Take 10 mg by mouth daily., Disp: , Rfl:    ammonium lactate (LAC-HYDRIN) 12 % lotion, Apply 1 application topically 2 (two) times daily., Disp: , Rfl:    apixaban (ELIQUIS) 5 MG TABS tablet, Take 1 tablet by mouth 2 times a day. Need to schedule appt with cardiologist for refills., Disp: 60 tablet, Rfl: 0   atorvastatin (LIPITOR) 80 MG tablet, Take 1 tablet (80 mg total) by mouth daily. (Patient taking differently: Take 80 mg by mouth daily. Pt states 64m daily), Disp: 90 tablet, Rfl: 3   camphor-menthol (SARNA) lotion, Apply 1 application topically as needed for itching., Disp: , Rfl:    cholecalciferol (VITAMIN D3) 10 MCG (400 UNIT) TABS tablet, Take 400 Units by mouth., Disp: , Rfl:    ferrous sulfate 325 (65 FE) MG tablet, Take 325 mg by mouth daily with breakfast., Disp: , Rfl:    potassium chloride SA (K-DUR,KLOR-CON) 20 MEQ tablet, Take 40 mEq by mouth 2 (two) times daily. Morning and evening, Disp: , Rfl:    triamterene-hydrochlorothiazide (MAXZIDE) 75-50 MG per tablet, Take 0.5 tablets by mouth every evening. , Disp: , Rfl:    diclofenac sodium (VOLTAREN) 1 % GEL, Apply 2 g topically 3 (three) times daily as needed. (Patient not taking: No sig reported),  Disp: , Rfl:    Allergies  Allergen Reactions   Ace Inhibitors Swelling    Throat swelling; pt was on lisinopril   Angiotensin Receptor Blockers Swelling    Cross reactivity in known ACE I allergy   Lisinopril Swelling    Throat swelling   Losartan Other (See Comments)    Doctor told pt not to take     Men's preventive visit. Patient Health Questionnaire (PHQ-2) is  Flowsheet Row Clinical Support from 10/20/2020 in [facility]  PHQ-2 Total Score 0     . Patient is on a low sodium diet. Marital status: Married. Relevant history for alcohol use is:  Social History    Substance and Sexual Activity  Alcohol Use Yes   Comment: 1-2 oz per month   . Relevant history for tobacco use is:  Social History   Tobacco Use  Smoking Status Every Day   Packs/day: 1.00   Years: 20.00   Pack years: 20.00   Types: Cigars, Cigarettes   Start date: 1965   Last attempt to quit: 08/18/2018   Years since quitting: 2.7  Smokeless Tobacco Never  Tobacco Comments   Started at age 20 - 1ppdx 55, quit x 5; pipex5 years, cigarsx15  .   Review of Systems  Constitutional: Negative.   HENT: Negative.    Eyes: Negative.  Negative for blurred vision.  Respiratory: Negative.  Negative for shortness of breath.   Cardiovascular: Negative.  Negative for chest pain and palpitations.  Gastrointestinal: Negative.   Endocrine: Negative.   Genitourinary: Negative.   Musculoskeletal:  Positive for arthralgias.       He c/o R shoulder pain. Denies fall/trauma. There is some pain with movement. Not sure what may have triggered her sx.   Skin: Negative.   Allergic/Immunologic: Positive for environmental allergies.  Neurological: Negative.   Hematological: Negative.   Psychiatric/Behavioral: Negative.      Today's Vitals   05/10/21 0846  BP: 128/84  Pulse: 83  Temp: 98.2 F (36.8 C)  TempSrc: Oral  Weight: 215 lb 3.2 oz (97.6 kg)  Height: _0  (1.753 m)  PainSc: 3   PainLoc: Shoulder   Body mass index is 31.78 kg/m.  Wt Readings from Last 3 Encounters:  05/10/21 215 lb 3.2 oz (97.6 kg)  04/19/21 219 lb (99.3 kg)  02/07/21 216 lb 3.2 oz (98.1 kg)    BP Readings from Last 3 Encounters:  05/10/21 128/84  04/19/21 140/89  02/07/21 116/82    Objective:  Physical Exam Vitals and nursing note reviewed.  Constitutional:      Appearance: Normal appearance.  HENT:     Head: Normocephalic and atraumatic.     Right Ear: Tympanic membrane, ear canal and external ear normal.     Left Ear: Tympanic membrane, ear canal and external ear normal.     Nose:     Comments:  Masked     Mouth/Throat:     Comments: Masked  Eyes:     Extraocular Movements: Extraocular movements intact.     Conjunctiva/sclera: Conjunctivae normal.     Pupils: Pupils are equal, round, and reactive to light.  Cardiovascular:     Rate and Rhythm: Normal rate and regular rhythm.     Pulses: Normal pulses.     Heart sounds: Normal heart sounds.  Pulmonary:     Effort: Pulmonary effort is normal.     Breath sounds: Normal breath sounds.  Chest:  Breasts:    Right: Normal.  No swelling, bleeding, inverted nipple, mass or nipple discharge.     Left: Normal. No swelling, bleeding, inverted nipple, mass or nipple discharge.  Abdominal:     General: Abdomen is flat. Bowel sounds are normal.     Palpations: Abdomen is soft.  Genitourinary:    Comments: Deferred  Musculoskeletal:        General: Tenderness present. Normal range of motion.     Cervical back: Normal range of motion and neck supple.  Skin:    General: Skin is warm.  Neurological:     General: No focal deficit present.     Mental Status: He is alert.  Psychiatric:        Mood and Affect: Mood normal.        Behavior: Behavior normal.        Assessment And Plan:    1. Routine general medical examination at health care facility Comments: A full exam was performed. DRE deferred, he has prostate exams performed at [facility]. PATIENT IS ADVISED TO GET 30-45 MINUTES REGULAR EXERCISE NO LESS THAN FOUR TO FIVE DAYS PER WEEK - BOTH WEIGHTBEARING EXERCISES AND AEROBIC ARE RECOMMENDED.  PATIENT IS ADVISED TO FOLLOW A HEALTHY DIET WITH AT LEAST SIX FRUITS/VEGGIES PER DAY, DECREASE INTAKE OF RED MEAT, AND TO INCREASE FISH INTAKE TO TWO DAYS PER WEEK.  MEATS/FISH SHOULD NOT BE FRIED, BAKED OR BROILED IS PREFERABLE.  IT IS ALSO IMPORTANT TO CUT BACK ON YOUR SUGAR INTAKE. PLEASE AVOID ANYTHING WITH ADDED SUGAR, CORN SYRUP OR OTHER SWEETENERS. IF YOU MUST USE A SWEETENER, YOU CAN TRY STEVIA. IT IS ALSO IMPORTANT TO AVOID ARTIFICIALLY  SWEETENERS AND DIET BEVERAGES. LASTLY, I SUGGEST WEARING SPF 50 SUNSCREEN ON EXPOSED PARTS AND ESPECIALLY WHEN IN THE DIRECT SUNLIGHT FOR AN EXTENDED PERIOD OF TIME.  PLEASE AVOID FAST FOOD RESTAURANTS AND INCREASE YOUR WATER INTAKE.  2. Hypertensive heart disease without heart failure Comments: Chronic, well controlled. EKG performed, NSR w/  incomplete RBBB & AFB, nonspecific T abnormality. He is encouraged to follow low sodium diet. He will rto in 6 months for f/u.  - POCT Urinalysis Dipstick (81002) - POCT UA - Microalbumin - CMP14+EGFR - Lipid panel - Insulin, random(561) - EKG 12-Lead  3. Paroxysmal atrial fibrillation (HCC) Comments: Chronic. He is currently in sinus rhythm. He is rate controlled and properly anticoagulated.   4. Atherosclerosis of aorta (HCC) Comments: Chronic, encouraged to comply with statin therapy and follow heart healthy diet.   5. Chronic left shoulder pain Comments: He wants to be evaluated at [facility] first, then will let me know if he wishes to see civilian provider. Pt advised he may benefit from PT eval.   6. Nocturia Comments: I will check PSA level today, DRE deferred.  - PSA  7. Class 1 obesity due to excess calories with serious comorbidity and body mass index (BMI) of 31.0 to 31.9 in adult Comments: He is encouraged to strive to lose 7-10 lbs to decrease cardiac risk. Advised to aim for at least 150 minutes of exercise per week.  8. Immunization due Comments: He was given high dose flu vaccine.  - Flu Vaccine QUAD High Dose(Fluad)  9. Personal history of COVID-19 Comments: He is fully vaccinated and boosted x 2.   Patient was given opportunity to ask questions. Patient verbalized understanding of the plan and was able to repeat key elements of the plan. All questions were answered to their satisfaction.   I, Richard Greenland, MD, have reviewed all documentation  for this visit. The documentation on 05/15/21 for the exam, diagnosis, procedures, and  orders are all accurate and complete.  THE PATIENT IS ENCOURAGED TO PRACTICE SOCIAL DISTANCING DUE TO THE COVID-19 PANDEMIC.

## 2021-05-11 LAB — LIPID PANEL
Chol/HDL Ratio: 2.7 ratio (ref 0.0–5.0)
Cholesterol, Total: 137 mg/dL (ref 100–199)
HDL: 51 mg/dL (ref >39)
LDL Chol Calc (NIH): 74 mg/dL (ref 0–99)
Triglycerides: 58 mg/dL (ref 0–149)
VLDL Cholesterol Cal: 12 mg/dL (ref 5–40)

## 2021-05-11 LAB — INSULIN, RANDOM: INSULIN: 12.5 u[IU]/mL (ref 2.6–24.9)

## 2021-05-11 LAB — PSA: Prostate Specific Ag, Serum: 2.4 ng/mL (ref 0.0–4.0)

## 2021-05-15 DIAGNOSIS — M25512 Pain in left shoulder: Secondary | ICD-10-CM | POA: Insufficient documentation

## 2021-05-15 DIAGNOSIS — Z8616 Personal history of COVID-19: Secondary | ICD-10-CM | POA: Insufficient documentation

## 2021-05-15 DIAGNOSIS — G8929 Other chronic pain: Secondary | ICD-10-CM | POA: Insufficient documentation

## 2021-05-15 DIAGNOSIS — R351 Nocturia: Secondary | ICD-10-CM | POA: Insufficient documentation

## 2021-05-15 DIAGNOSIS — I700 Atherosclerosis of aorta: Secondary | ICD-10-CM | POA: Insufficient documentation

## 2021-05-26 ENCOUNTER — Telehealth: Payer: Self-pay

## 2021-05-26 NOTE — Telephone Encounter (Signed)
Patient called and stated he is okay with seeing the urologist. What code do you want me to use for the referral? YL,RMA

## 2021-06-02 ENCOUNTER — Encounter: Payer: Self-pay | Admitting: Internal Medicine

## 2021-06-02 ENCOUNTER — Other Ambulatory Visit: Payer: Self-pay

## 2021-06-02 DIAGNOSIS — R972 Elevated prostate specific antigen [PSA]: Secondary | ICD-10-CM

## 2021-07-27 ENCOUNTER — Emergency Department (HOSPITAL_BASED_OUTPATIENT_CLINIC_OR_DEPARTMENT_OTHER): Payer: Medicare PPO

## 2021-07-27 ENCOUNTER — Observation Stay (HOSPITAL_BASED_OUTPATIENT_CLINIC_OR_DEPARTMENT_OTHER)
Admission: EM | Admit: 2021-07-27 | Discharge: 2021-07-28 | Disposition: A | Discharge status: Home / Self Care | Payer: Medicare PPO | Attending: Family Medicine | Admitting: Family Medicine

## 2021-07-27 ENCOUNTER — Observation Stay (HOSPITAL_COMMUNITY): Payer: Medicare PPO

## 2021-07-27 ENCOUNTER — Encounter (HOSPITAL_BASED_OUTPATIENT_CLINIC_OR_DEPARTMENT_OTHER): Payer: Self-pay | Admitting: Emergency Medicine

## 2021-07-27 ENCOUNTER — Other Ambulatory Visit: Payer: Self-pay

## 2021-07-27 DIAGNOSIS — J984 Other disorders of lung: Secondary | ICD-10-CM | POA: Diagnosis not present

## 2021-07-27 DIAGNOSIS — R011 Cardiac murmur, unspecified: Secondary | ICD-10-CM | POA: Diagnosis not present

## 2021-07-27 DIAGNOSIS — Z7901 Long term (current) use of anticoagulants: Secondary | ICD-10-CM

## 2021-07-27 DIAGNOSIS — I129 Hypertensive chronic kidney disease with stage 1 through stage 4 chronic kidney disease, or unspecified chronic kidney disease: Secondary | ICD-10-CM | POA: Insufficient documentation

## 2021-07-27 DIAGNOSIS — Z8616 Personal history of COVID-19: Secondary | ICD-10-CM | POA: Insufficient documentation

## 2021-07-27 DIAGNOSIS — R9431 Abnormal electrocardiogram [ECG] [EKG]: Secondary | ICD-10-CM | POA: Diagnosis present

## 2021-07-27 DIAGNOSIS — I480 Paroxysmal atrial fibrillation: Secondary | ICD-10-CM | POA: Diagnosis not present

## 2021-07-27 DIAGNOSIS — N183 Chronic kidney disease, stage 3 unspecified: Secondary | ICD-10-CM | POA: Diagnosis not present

## 2021-07-27 DIAGNOSIS — R079 Chest pain, unspecified: Secondary | ICD-10-CM | POA: Diagnosis present

## 2021-07-27 DIAGNOSIS — I77811 Abdominal aortic ectasia: Secondary | ICD-10-CM | POA: Diagnosis not present

## 2021-07-27 DIAGNOSIS — Z20822 Contact with and (suspected) exposure to covid-19: Secondary | ICD-10-CM | POA: Insufficient documentation

## 2021-07-27 DIAGNOSIS — E7800 Pure hypercholesterolemia, unspecified: Secondary | ICD-10-CM | POA: Diagnosis not present

## 2021-07-27 DIAGNOSIS — E041 Nontoxic single thyroid nodule: Secondary | ICD-10-CM | POA: Diagnosis not present

## 2021-07-27 DIAGNOSIS — K5730 Diverticulosis of large intestine without perforation or abscess without bleeding: Secondary | ICD-10-CM | POA: Diagnosis not present

## 2021-07-27 DIAGNOSIS — Z79899 Other long term (current) drug therapy: Secondary | ICD-10-CM | POA: Insufficient documentation

## 2021-07-27 DIAGNOSIS — N281 Cyst of kidney, acquired: Secondary | ICD-10-CM | POA: Diagnosis not present

## 2021-07-27 DIAGNOSIS — I10 Essential (primary) hypertension: Secondary | ICD-10-CM | POA: Diagnosis present

## 2021-07-27 DIAGNOSIS — R0789 Other chest pain: Secondary | ICD-10-CM

## 2021-07-27 DIAGNOSIS — Z720 Tobacco use: Secondary | ICD-10-CM | POA: Diagnosis present

## 2021-07-27 DIAGNOSIS — K219 Gastro-esophageal reflux disease without esophagitis: Secondary | ICD-10-CM | POA: Diagnosis present

## 2021-07-27 DIAGNOSIS — R109 Unspecified abdominal pain: Secondary | ICD-10-CM | POA: Diagnosis not present

## 2021-07-27 DIAGNOSIS — F17200 Nicotine dependence, unspecified, uncomplicated: Secondary | ICD-10-CM | POA: Diagnosis present

## 2021-07-27 DIAGNOSIS — K8020 Calculus of gallbladder without cholecystitis without obstruction: Secondary | ICD-10-CM | POA: Diagnosis not present

## 2021-07-27 DIAGNOSIS — R1011 Right upper quadrant pain: Secondary | ICD-10-CM | POA: Diagnosis not present

## 2021-07-27 DIAGNOSIS — I2510 Atherosclerotic heart disease of native coronary artery without angina pectoris: Secondary | ICD-10-CM | POA: Insufficient documentation

## 2021-07-27 DIAGNOSIS — R072 Precordial pain: Principal | ICD-10-CM | POA: Insufficient documentation

## 2021-07-27 DIAGNOSIS — M47816 Spondylosis without myelopathy or radiculopathy, lumbar region: Secondary | ICD-10-CM | POA: Diagnosis not present

## 2021-07-27 DIAGNOSIS — F17210 Nicotine dependence, cigarettes, uncomplicated: Secondary | ICD-10-CM | POA: Insufficient documentation

## 2021-07-27 DIAGNOSIS — K769 Liver disease, unspecified: Secondary | ICD-10-CM | POA: Diagnosis not present

## 2021-07-27 DIAGNOSIS — R071 Chest pain on breathing: Secondary | ICD-10-CM | POA: Diagnosis not present

## 2021-07-27 DIAGNOSIS — E785 Hyperlipidemia, unspecified: Secondary | ICD-10-CM | POA: Diagnosis present

## 2021-07-27 DIAGNOSIS — N1831 Chronic kidney disease, stage 3a: Secondary | ICD-10-CM | POA: Diagnosis present

## 2021-07-27 HISTORY — DX: Abnormal findings on diagnostic imaging of heart and coronary circulation: R93.1

## 2021-07-27 HISTORY — DX: Gastrointestinal hemorrhage, unspecified: K92.2

## 2021-07-27 HISTORY — DX: Cardiac murmur, unspecified: R01.1

## 2021-07-27 HISTORY — DX: Aneurysm of the ascending aorta, without rupture: I71.21

## 2021-07-27 LAB — HEPATIC FUNCTION PANEL
ALT: 15 U/L (ref 0–44)
AST: 19 U/L (ref 15–41)
Albumin: 4.1 g/dL (ref 3.5–5.0)
Alkaline Phosphatase: 75 U/L (ref 38–126)
Bilirubin, Direct: <0.1 mg/dL (ref 0.0–0.2)
Total Bilirubin: 0.6 mg/dL (ref 0.3–1.2)
Total Protein: 7.4 g/dL (ref 6.5–8.1)

## 2021-07-27 LAB — CBC
HCT: 40.9 % (ref 39.0–52.0)
Hemoglobin: 13.9 g/dL (ref 13.0–17.0)
MCH: 30.8 pg (ref 26.0–34.0)
MCHC: 34 g/dL (ref 30.0–36.0)
MCV: 90.7 fL (ref 80.0–100.0)
Platelets: 208 10*3/uL (ref 150–400)
RBC: 4.51 MIL/uL (ref 4.22–5.81)
RDW: 13.9 % (ref 11.5–15.5)
WBC: 5.6 10*3/uL (ref 4.0–10.5)
nRBC: 0 % (ref 0.0–0.2)

## 2021-07-27 LAB — URINALYSIS, ROUTINE W REFLEX MICROSCOPIC
Bilirubin Urine: NEGATIVE
Glucose, UA: NEGATIVE mg/dL
Hgb urine dipstick: NEGATIVE
Ketones, ur: NEGATIVE mg/dL
Leukocytes,Ua: NEGATIVE
Nitrite: NEGATIVE
Protein, ur: NEGATIVE mg/dL
Specific Gravity, Urine: 1.01 (ref 1.005–1.030)
pH: 7 (ref 5.0–8.0)

## 2021-07-27 LAB — MAGNESIUM: Magnesium: 2 mg/dL (ref 1.7–2.4)

## 2021-07-27 LAB — BASIC METABOLIC PANEL WITH GFR
Anion gap: 9 (ref 5–15)
BUN: 16 mg/dL (ref 8–23)
CO2: 26 mmol/L (ref 22–32)
Calcium: 9.6 mg/dL (ref 8.9–10.3)
Chloride: 105 mmol/L (ref 98–111)
Creatinine, Ser: 1.12 mg/dL (ref 0.61–1.24)
GFR, Estimated: >60 mL/min
Glucose, Bld: 94 mg/dL (ref 70–99)
Potassium: 3.6 mmol/L (ref 3.5–5.1)
Sodium: 140 mmol/L (ref 135–145)

## 2021-07-27 LAB — RESP PANEL BY RT-PCR (FLU A&B, COVID) ARPGX2
Influenza A by PCR: NEGATIVE
Influenza B by PCR: NEGATIVE
SARS Coronavirus 2 by RT PCR: NEGATIVE

## 2021-07-27 LAB — C-REACTIVE PROTEIN: CRP: 2.1 mg/dL — ABNORMAL HIGH (ref <1.0)

## 2021-07-27 LAB — TROPONIN I (HIGH SENSITIVITY)
Troponin I (High Sensitivity): 3 ng/L
Troponin I (High Sensitivity): 2 ng/L (ref <18)

## 2021-07-27 LAB — SEDIMENTATION RATE: Sed Rate: 15 mm/hr (ref 0–16)

## 2021-07-27 MED ORDER — POTASSIUM CHLORIDE CRYS ER 20 MEQ PO TBCR
40.0000 meq | EXTENDED_RELEASE_TABLET | Freq: Two times a day (BID) | ORAL | Status: DC
  Administered 2021-07-27 – 2021-07-28 (×2): 40 meq via ORAL
  Filled 2021-07-27 (×2): qty 2

## 2021-07-27 MED ORDER — FENTANYL CITRATE PF 50 MCG/ML IJ SOSY
50.0000 ug | PREFILLED_SYRINGE | Freq: Once | INTRAMUSCULAR | Status: AC
  Administered 2021-07-27: 50 ug via INTRAVENOUS
  Filled 2021-07-27: qty 1

## 2021-07-27 MED ORDER — NITROGLYCERIN 2 % TD OINT
1.0000 [in_us] | TOPICAL_OINTMENT | Freq: Once | TRANSDERMAL | Status: AC
  Administered 2021-07-27: 1 [in_us] via TOPICAL
  Filled 2021-07-27: qty 1

## 2021-07-27 MED ORDER — IOHEXOL 350 MG/ML SOLN
100.0000 mL | Freq: Once | INTRAVENOUS | Status: AC | PRN
  Administered 2021-07-27: 100 mL via INTRAVENOUS

## 2021-07-27 MED ORDER — MORPHINE SULFATE (PF) 2 MG/ML IV SOLN: 1.0000 mg | INTRAVENOUS | Status: DC | PRN

## 2021-07-27 MED ORDER — SODIUM CHLORIDE 0.9% FLUSH: 3.0000 mL | INTRAVENOUS | Status: DC | PRN

## 2021-07-27 MED ORDER — APIXABAN 5 MG PO TABS
5.0000 mg | ORAL_TABLET | Freq: Two times a day (BID) | ORAL | Status: DC
  Administered 2021-07-27 – 2021-07-28 (×2): 5 mg via ORAL
  Filled 2021-07-27 (×2): qty 1

## 2021-07-27 MED ORDER — DICLOFENAC SODIUM 1 % EX GEL
2.0000 g | Freq: Three times a day (TID) | CUTANEOUS | Status: DC | PRN
  Administered 2021-07-27: 2 g via TOPICAL
  Filled 2021-07-27: qty 100

## 2021-07-27 MED ORDER — DICLOFENAC SODIUM 1 % TD GEL: 2.0000 g | Freq: Three times a day (TID) | TRANSDERMAL | Status: DC | PRN

## 2021-07-27 MED ORDER — TRIAMTERENE-HCTZ 75-50 MG PO TABS
0.5000 | ORAL_TABLET | Freq: Every evening | ORAL | Status: DC
  Administered 2021-07-27: 0.5 via ORAL
  Filled 2021-07-27 (×2): qty 0.5

## 2021-07-27 MED ORDER — ATORVASTATIN CALCIUM 40 MG PO TABS
40.0000 mg | ORAL_TABLET | Freq: Every day | ORAL | Status: DC
  Administered 2021-07-27 – 2021-07-28 (×2): 40 mg via ORAL
  Filled 2021-07-27 (×2): qty 1

## 2021-07-27 MED ORDER — SODIUM CHLORIDE 0.9 % IV SOLN: 250.0000 mL | INTRAVENOUS | Status: DC | PRN

## 2021-07-27 MED ORDER — CHOLECALCIFEROL 10 MCG (400 UNIT) PO TABS
400.0000 [IU] | ORAL_TABLET | Freq: Every day | ORAL | Status: DC
Start: 2021-07-27 — End: 2021-07-28
  Administered 2021-07-27 – 2021-07-28 (×2): 400 [IU] via ORAL
  Filled 2021-07-27 (×2): qty 1

## 2021-07-27 MED ORDER — MORPHINE SULFATE (PF) 4 MG/ML IV SOLN
4.0000 mg | Freq: Once | INTRAVENOUS | Status: DC
  Filled 2021-07-27: qty 1

## 2021-07-27 MED ORDER — SODIUM CHLORIDE 0.9% FLUSH
3.0000 mL | Freq: Two times a day (BID) | INTRAVENOUS | Status: DC
  Administered 2021-07-27 – 2021-07-28 (×2): 3 mL via INTRAVENOUS

## 2021-07-27 MED ORDER — AMLODIPINE BESYLATE 10 MG PO TABS
10.0000 mg | ORAL_TABLET | Freq: Every day | ORAL | Status: DC
  Administered 2021-07-28: 10 mg via ORAL
  Filled 2021-07-27: qty 1

## 2021-07-27 MED ORDER — COLCHICINE 0.6 MG PO TABS
0.6000 mg | ORAL_TABLET | Freq: Two times a day (BID) | ORAL | Status: DC
  Administered 2021-07-27 – 2021-07-28 (×2): 0.6 mg via ORAL
  Filled 2021-07-27 (×2): qty 1

## 2021-07-27 MED ORDER — HYDROCODONE-ACETAMINOPHEN 5-325 MG PO TABS: 1.0000 | ORAL_TABLET | ORAL | Status: DC | PRN

## 2021-07-27 MED ORDER — FAMOTIDINE 20 MG PO TABS
20.0000 mg | ORAL_TABLET | Freq: Two times a day (BID) | ORAL | Status: DC
Start: 2021-07-27 — End: 2021-07-27

## 2021-07-27 MED ORDER — FERROUS SULFATE 325 (65 FE) MG PO TABS
325.0000 mg | ORAL_TABLET | Freq: Every day | ORAL | Status: DC
  Administered 2021-07-28: 325 mg via ORAL
  Filled 2021-07-27: qty 1

## 2021-07-27 MED ORDER — ACETAMINOPHEN 650 MG RE SUPP: 650.0000 mg | Freq: Four times a day (QID) | RECTAL | Status: DC | PRN

## 2021-07-27 MED ORDER — ACETAMINOPHEN 325 MG PO TABS: 650.0000 mg | ORAL_TABLET | Freq: Four times a day (QID) | ORAL | Status: DC | PRN

## 2021-07-27 MED ORDER — MORPHINE SULFATE (PF) 4 MG/ML IV SOLN
4.0000 mg | Freq: Once | INTRAVENOUS | Status: AC
  Administered 2021-07-27: 4 mg via INTRAVENOUS
  Filled 2021-07-27: qty 1

## 2021-07-27 MED ORDER — PANTOPRAZOLE SODIUM 40 MG PO TBEC
40.0000 mg | DELAYED_RELEASE_TABLET | Freq: Every day | ORAL | Status: DC
  Administered 2021-07-28: 40 mg via ORAL
  Filled 2021-07-27: qty 1

## 2021-07-27 NOTE — ED Notes (Signed)
Pt offered morphine states that he does not like taking anything that strong and would like to have something for pain that he can drive home on. Charm Barges ED MD made aware.

## 2021-07-27 NOTE — ED Provider Notes (Signed)
[facility] EMERGENCY DEPARTMENT Provider Note   CSN: [id number] Arrival date & time: 07/27/21  0757     History Chief Complaint  Patient presents with   Chest Pain    Richard Sampson is a 75 y.o. male.  Is a history of A. fib on anticoagulation.  Complaining of pain across to his chest aching in nature radiating through to his upper back that started last evening.  That he did not sleep well because of it.  Rates it as 8 out of 10.  Worse with taking a deep breath although not short of breath.  No diaphoresis nausea vomiting fever cough abdominal pain.  No numbness or weakness.  No known coronary disease.  The history is provided by the patient.  Chest Pain Pain location:  Substernal area Pain quality: aching   Pain radiates to:  Upper back Pain severity:  Severe Onset quality:  Sudden Duration:  12 hours Timing:  Constant Progression:  Unchanged Chronicity:  New Relieved by:  Nothing Worsened by:  Deep breathing Ineffective treatments:  None tried Associated symptoms: back pain   Associated symptoms: no abdominal pain, no cough, no diaphoresis, no dizziness, no dysphagia, no fever, no headache, no lower extremity edema, no nausea, no numbness, no shortness of breath and no weakness   Risk factors: high cholesterol, hypertension and smoking    HPI: A 75 year old patient with a history of peripheral artery disease, hypertension, hypercholesterolemia and obesity presents for evaluation of chest pain. Initial onset of pain was more than 6 hours ago. The patient's chest pain is described as heaviness/pressure/tightness and is not worse with exertion. The patient's chest pain is middle- or left-sided, is not well-localized, is not sharp and does not radiate to the arms/jaw/neck. The patient does not complain of nausea and denies diaphoresis. The patient has smoked in the past 90 days and has a family history of coronary artery disease in a first-degree relative with onset  less than age 93. The patient has no history of stroke and denies any history of treated diabetes.   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   Tobacco abuse 10/18/2015   Acute kidney injury ([place]) 07/18/2014   PAF 2015 07/18/2014   Chest pain with moderate risk of acute coronary syndrome 07/17/2014   HLD (hyperlipidemia) 07/17/2014   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   Hypokalemia 03/16/2013   Angio-edema-secondary to ACE 03/16/2013    Past Surgical History:  Procedure Laterality Date   left wrist surgery Left 1966   glass cut ligaments, surgical repair   TONSILLECTOMY  1955   VASECTOMY  1980       Family History  Problem Relation Age of Onset   Thyroid nodules Mother    Dementia Mother    Heart failure Father    Cancer Father  Kidney disease Son    Diabetes Brother    Cancer Brother    Heart attack Neg Hx    Stroke Neg Hx     Social History   Tobacco Use   Smoking status: Every Day    Packs/day: 1.00    Years: 20.00    Pack years: 20.00    Types: Cigars, Cigarettes    Start date: 1965    Last attempt to quit: 08/18/2018    Years since quitting: 2.9   Smokeless tobacco: Never   Tobacco comments:    Started at age 43 - 1ppdx 59, quit x 5; pipex5 years, cigarsx15  Vaping Use   Vaping Use: Never used   Substance Use Topics   Alcohol use: Yes    Comment: 1-2 oz per month    Drug use: Not Currently    Home Medications Prior to Admission medications   Medication Sig Start Date End Date Taking? Authorizing Provider  amLODipine (NORVASC) 10 MG tablet Take 10 mg by mouth daily.    [provider]  ammonium lactate (LAC-HYDRIN) 12 % lotion Apply 1 application topically 2 (two) times daily.    [provider]  apixaban (ELIQUIS) 5 MG TABS tablet Take 1 tablet by mouth 2 times a day. Need to schedule appt with cardiologist for refills. 12/28/16   Duke Salvia, MD  atorvastatin (LIPITOR) 80 MG tablet Take 1 tablet (80 mg total) by mouth daily. Patient taking differently: Take 80 mg by mouth daily. Pt states 40mg  daily 03/24/20   05/24/20, MD  camphor-menthol [facility]) lotion Apply 1 application topically as needed for itching.    [provider]  cholecalciferol (VITAMIN D3) 10 MCG (400 UNIT) TABS tablet Take 400 Units by mouth.    [provider]  diclofenac sodium (VOLTAREN) 1 % GEL Apply 2 g topically 3 (three) times daily as needed. Patient not taking: No sig reported    [provider]  ferrous sulfate 325 (65 FE) MG tablet Take 325 mg by mouth daily with breakfast.    [provider]  potassium chloride SA (K-DUR,KLOR-CON) 20 MEQ tablet Take 40 mEq by mouth 2 (two) times daily. Morning and evening    [provider]  triamterene-hydrochlorothiazide (MAXZIDE) 75-50 MG per tablet Take 0.5 tablets by mouth every evening.     [provider]    Allergies    Ace inhibitors, Angiotensin receptor blockers, Lisinopril, and Losartan  Review of Systems   Review of Systems  Constitutional:  Negative for diaphoresis and fever.  HENT:  Negative for sore throat and trouble swallowing.   Eyes:  Negative for visual disturbance.  Respiratory:  Negative for cough and shortness of breath.   Cardiovascular:  Positive for chest  pain.  Gastrointestinal:  Negative for abdominal pain and nausea.  Genitourinary:  Negative for dysuria.  Musculoskeletal:  Positive for back pain.  Skin:  Negative for rash.  Neurological:  Negative for dizziness, weakness, numbness and headaches.   Physical Exam Updated Vital Signs BP (!) 144/97   Pulse 89   Temp 98.6 F (37 C) (Oral)   Resp (!) 21   Ht 5\' 10"  (1.778 m)   Wt 97.5 kg   SpO2 98%   BMI 30.85 kg/m   Physical Exam Vitals and nursing note reviewed.  Constitutional:      General: He is not in acute distress.    Appearance: He is well-developed.  HENT:     Head: Normocephalic and  atraumatic.  Eyes:     Conjunctiva/sclera: Conjunctivae normal.  Cardiovascular:     Rate and Rhythm: Normal rate and regular rhythm.     Heart sounds: Normal heart sounds. No murmur heard. Pulmonary:     Effort: Pulmonary effort is normal. No respiratory distress.     Breath sounds: Normal breath sounds.  Abdominal:     Palpations: Abdomen is soft. There is no mass.     Tenderness: There is no abdominal tenderness. There is no guarding or rebound.  Musculoskeletal:        General: No swelling.     Cervical back: Neck supple.     Right lower leg: No tenderness. No edema.     Left lower leg: No tenderness. No edema.  Skin:    General: Skin is warm and dry.     Capillary Refill: Capillary refill takes less than 2 seconds.  Neurological:     General: No focal deficit present.     Mental Status: He is alert.  Psychiatric:        Mood and Affect: Mood normal.    ED Results / Procedures / Treatments   Labs (all labs ordered are listed, but only abnormal results are displayed) Labs Reviewed  RESP PANEL BY RT-PCR (FLU A&B, COVID) ARPGX2  BASIC METABOLIC PANEL  CBC  HEPATIC FUNCTION PANEL  MAGNESIUM  BASIC METABOLIC PANEL  URINALYSIS, ROUTINE W REFLEX MICROSCOPIC  TROPONIN I (HIGH SENSITIVITY)  TROPONIN I (HIGH SENSITIVITY)    EKG EKG  Interpretation  Date/Time:  Wednesday July 27 2021 08:05:40 EST Ventricular Rate:  90 PR Interval:  220 QRS Duration: 107 QT Interval:  383 QTC Calculation: 469 R Axis:   251 Text Interpretation: Sinus rhythm Borderline prolonged PR interval Left atrial enlargement Left anterior fascicular block Abnormal R-wave progression, late transition Borderline ST elevation, lateral leads No significant change since prior 8/22 Confirmed by Aletta Edouard [id number]) on 07/27/2021 8:24:57 AM  Radiology DG Chest Port 1 View  Result Date: 07/27/2021 CLINICAL DATA:  Chest pain beginning last night EXAM: PORTABLE CHEST 1 VIEW COMPARISON:  04/19/2021 FINDINGS: The heart size and mediastinal contours are within normal limits. Both lungs are clear. The visualized skeletal structures are unremarkable. IMPRESSION: No active disease. Electronically Signed   By: Nelson Chimes M.D.   On: 07/27/2021 08:41   CT Angio Chest/Abd/Pel for Dissection W and/or W/WO  Result Date: 07/27/2021 CLINICAL DATA:  Left chest pain EXAM: CT ANGIOGRAPHY CHEST, ABDOMEN AND PELVIS TECHNIQUE: Non-contrast CT of the chest was initially obtained. Multidetector CT imaging through the chest, abdomen and pelvis was performed using the standard protocol during bolus administration of intravenous contrast. Multiplanar reconstructed images and MIPs were obtained and reviewed to evaluate the vascular anatomy. CONTRAST:  150mL OMNIPAQUE IOHEXOL 350 MG/ML SOLN COMPARISON:  02/16/2021 FINDINGS: CTA CHEST FINDINGS Cardiovascular: There is no demonstrable mural hematoma in thoracic aorta. There is homogeneous enhancement in the thoracic aorta. There is no demonstrable intimal flap. Major branches of thoracic aorta appear patent. There are no filling defects in the central pulmonary artery branches. There are scattered coronary artery calcifications. Mediastinum/Nodes: No significant lymphadenopathy seen in mediastinum. There is 1.7 cm low-density nodule in  the left lobe of thyroid. There are other smaller nodules in both lobes. Lungs/Pleura: There is no focal pulmonary consolidation. There is no pleural effusion or pneumothorax. Musculoskeletal: Unremarkable. Review of the MIP images confirms the above findings. CTA ABDOMEN AND PELVIS FINDINGS VASCULAR Aorta: There is mild ectasia of abdominal aorta  at the level of renal arteries measuring 3.3 cm in diameter. There is no demonstrable intimal flap. Celiac: Unremarkable. SMA: Unremarkable. Renals: No significant stenosis. IMA: Patent Inflow: Unremarkable Veins: Major venous structures are unremarkable. Review of the MIP images confirms the above findings. NON-VASCULAR Hepatobiliary: Liver measures 15.8 cm in length. There is 10 mm low-density in the right lobe in image 92 of series 6, possibly a cyst. There is possible 8 mm cyst in the liver in image 82. there is no dilation of bile ducts. Gallbladder stones are seen. Pancreas: No focal abnormality is seen. Spleen: Spleen is not enlarged. Adrenals/Urinary Tract: There is hyperplasia of adrenals. There is no hydronephrosis. There is 3.8 cm cyst in the upper pole. In image 110, there is possible 10 mm cyst in the medial upper pole of left kidney. In the image 108 of series 6, there is 1.8 cm smooth marginated lesion with density measurements higher than usual for simple cyst in the posterolateral aspect of left kidney. In the image 116 of series 6, there is 8 mm smooth marginated exophytic lesion in the lower pole of right kidney with density measurements higher than usual for simple cyst. There are no renal or ureteral stones. Urinary bladder is not distended. Stomach/Bowel: Stomach is not distended. Small bowel loops are not dilated. Appendix is not dilated. Scattered diverticula are seen in the colon without signs of focal acute diverticulitis. Lymphatic: No significant lymphadenopathy seen. Reproductive: Unremarkable. Other: There is no ascites or pneumoperitoneum.  Musculoskeletal: There is first-degree anterolisthesis at L5-S1 level. There is encroachment of neural foramina at L4-L5 and L5-S1 levels. Review of the MIP images confirms the above findings. IMPRESSION: There is no evidence of dissection in the thoracic aorta. There are scattered coronary artery calcifications. There are no filling defects in the central pulmonary artery branches. There is ectasia of abdominal aorta at the level of renal arteries measuring 3.3 cm in diameter. There is no demonstrable dissection in the abdominal aorta. Major branches of the thoracic and abdominal aorta are patent. There are nodular densities in the both lobes of thyroid largest measuring 1.7 cm in size in the left lobe. Thyroid sonogram in outpatient setting should be considered for further characterization. There is no focal pulmonary consolidation. There is no pleural effusion or pneumothorax. There is no evidence of intestinal obstruction or pneumoperitoneum. There is no hydronephrosis. Appendix is not dilated. Gallbladder stones. There small low-density foci in the liver, possibly cysts. There is no dilation of bile ducts. Scattered coronary artery calcifications are seen. Diverticulosis of colon without signs of focal diverticulitis. Lumbar spondylosis. Bilateral renal cysts. There are few low-density lesions in both kidneys with density measurements higher than usual for simple cysts suggesting hemorrhagic cysts or solid lesions. When the patient's clinical condition permits follow-up renal sonogram may be considered. Electronically Signed   By: Elmer Picker M.D.   On: 07/27/2021 10:11   US Abdomen Limited RUQ (LIVER/GB)  Result Date: 07/27/2021 CLINICAL DATA:  Abdominal pain EXAM: ULTRASOUND ABDOMEN LIMITED RIGHT UPPER QUADRANT COMPARISON:  CT done earlier today FINDINGS: Gallbladder: There are small echogenic foci each measuring less than 6 mm in size in the lumen of gallbladder. There is no definite acoustic  shadowing. Technologist did not observe any tenderness over the gallbladder. There is no fluid around the gallbladder. Common bile duct: Diameter: 6 mm Liver: No focal lesion identified. Within normal limits in parenchymal echogenicity. Small low-density foci seen in the CT could not be distinctly identified in the submitted  images of the liver. Portal vein is patent on color Doppler imaging with normal direction of blood flow towards the liver. Other: None. IMPRESSION: There are small echogenic foci in the lumen of gallbladder suggesting gallbladder stones. There are no imaging signs of acute cholecystitis. Common bile duct measures 6 mm without dilation of intrahepatic bile ducts. Electronically Signed   By: Elmer Picker M.D.   On: 07/27/2021 11:15    Procedures Procedures   Medications Ordered in ED Medications  famotidine (PEPCID) tablet 20 mg (has no administration in time range)  sodium chloride flush (NS) 0.9 % injection 3 mL (has no administration in time range)  sodium chloride flush (NS) 0.9 % injection 3 mL (has no administration in time range)  0.9 %  sodium chloride infusion (has no administration in time range)  acetaminophen (TYLENOL) tablet 650 mg (has no administration in time range)    Or  acetaminophen (TYLENOL) suppository 650 mg (has no administration in time range)  HYDROcodone-acetaminophen (NORCO/VICODIN) 5-325 MG per tablet 1 tablet (has no administration in time range)  morphine 2 MG/ML injection 1 mg (has no administration in time range)  amLODipine (NORVASC) tablet 10 mg (has no administration in time range)  atorvastatin (LIPITOR) tablet 40 mg (has no administration in time range)  apixaban (ELIQUIS) tablet 5 mg (has no administration in time range)  ferrous sulfate tablet 325 mg (has no administration in time range)  cholecalciferol (VITAMIN D3) tablet 400 Units (has no administration in time range)  diclofenac sodium (VOLTAREN) 1 % transdermal gel 2 g (has  no administration in time range)  triamterene-hydrochlorothiazide (MAXZIDE) 75-50 MG per tablet 0.5 tablet (has no administration in time range)  potassium chloride SA (KLOR-CON M) CR tablet 40 mEq (has no administration in time range)  iohexol (OMNIPAQUE) 350 MG/ML injection 100 mL (100 mLs Intravenous Contrast Given 07/27/21 0916)  fentaNYL (SUBLIMAZE) injection 50 mcg (50 mcg Intravenous Given 07/27/21 0935)  morphine 4 MG/ML injection 4 mg (4 mg Intravenous Given 07/27/21 1211)  nitroGLYCERIN (NITROGLYN) 2 % ointment 1 inch (1 inch Topical Given 07/27/21 1305)    ED Course  I have reviewed the triage vital signs and the nursing notes.  Pertinent labs & imaging results that were available during my care of the patient were reviewed by me and considered in my medical decision making (see chart for details).  Clinical Course as of 07/27/21 1718  Wed Jul 27, 2021  1152 Assessed patient.  He has ongoing chest pain rates it a 9 out of 10.  His work-up has been fairly unremarkable.  Does have a gallstone but no evidence of Coley cystitis [MB]  1224 Discussed with Dr. Rogers Blocker she will place the patient in for a telemetry bed.  She said Nitropaste if his pain was improved with the morphine. [MB]    Clinical Course User Index [MB] Hayden Rasmussen, MD   MDM Rules/Calculators/A&P HEAR Score: 6                        This patient complains of chest pain radiating into back.; this involves an extensive number of treatment Options and is a complaint that carries with it a high risk of complications and Morbidity. The differential includes ACS, pneumonia, pneumothorax, PE, vascular, musculoskeletal, reflux  I ordered, reviewed and interpreted labs, which included CBC with normal white count normal hemoglobin, chemistries normal, LFTs normal, troponins flat, COVID and flu negative I ordered medication IV pain medication, nitroglycerin  paste I ordered imaging studies which included chest x-ray, CT angio  dissection study, ultrasound right upper quadrant and I independently    visualized and interpreted imaging which showed no acute signs of dissection.  Does have incidental findings including gallstones, thyroid nodules, kidney cysts  Previous records obtained and reviewed in epic, saw cardiology one years ago and had negative nuclear study I consulted Dr. Rogers Blocker [facility] hospitalist and discussed lab and imaging findings  Critical Interventions: None  After the interventions stated above, I reevaluated the patient and found patient still to be in pain.  Morphine seem to help the most.  Patient has an elevated heart score and a little pain seems atypical will need further work-up.  Patient agreeable to plan.   Final Clinical Impression(s) / ED Diagnoses Final diagnoses:  Thyroid nodule  Renal cyst  RUQ abdominal pain  Chest pain, unspecified type    Rx / DC Orders ED Discharge Orders     None        Hayden Rasmussen, MD 07/27/21 1723

## 2021-07-27 NOTE — ED Triage Notes (Signed)
Chest pain since last night.  Central chest, radiating to right shoulder.  Difficult to take a deep breath.  No diaphoresis, no nausea.  No fever.  Dry cough.

## 2021-07-27 NOTE — H&P (Signed)
History and Physical    Richard Sampson [id number] DOB: 28-Nov-1945 DOA: 07/27/2021  PCP: Glendale Chard, MD as well as [facility]:  cardiology: Dr. Radford Pax  Patient coming from: [facility]   lives at home with his wife.   Chief Complaint: chest pain   HPI: Richard Sampson is a 75 y.o. 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 He states yesterday evening around 9:00pm he started to feel tightness around his neck and as the night progressed moved down into the center of his chest. Pain was worse with deep inspiration. He thought it would go away, but the pain woke him up several times during the night. Pain is substernal and pain rated as a 9/10 and described as a pulled muscle. He feels like it radiated down the right side of his neck into his right side a few times, but not up his jaw or down his left arm. He has no associated diaphoresis, shortness of breath, dizziness. Pain initially came and went and then during the night it seemed to be more constant. Pain seemed worse with exertion and breathing. Moving arm did not make it worse, not associated with food, but he hasn't eaten. He tried some tylenol at home with no relief. He also states pain was reproducible, but not as much today. No heavy lifting and no worse with arm movements.   Denies any fever/chills, headaches/vision changes, shortness of breath, cough, abdominal pain, N/V/D, leg swelling, dysuria/frequency or urgency. Has palpitations at times.   Risk factors include HTN, HLD, tobacco abuse. He has no family history of heart disease. He has been riding his bike 5-15 miles up until last week with no issues.   Smokes 4 cigarettes/day and no alcohol.   He has a history of GERD, but is on no px medication. He states when he eats, especially spicy foods, he will have  burning in his chest. Takes tums with little relief.   ED Course: vitals: afebrile, bp: 144/97, HR: 89, RR: 21, oxygen: 98% RA Pertinent labs: troponin wnl x 2. Covid/flu negative. Ekg NSR with rate of 90, borderline prolonged qt, LAFB. No significant changes.  In ED: given fentanyl, morphine and nitro paste. [facility] asked to admit for chest pain.   Review of Systems: As per HPI; otherwise review of systems reviewed and negative.   Ambulatory Status:  Ambulates without assistance   Past Medical History:  Diagnosis Date   A-fib ([place]) 07/17/2014   High cholesterol    Hypertension    Pneumonia     Past Surgical History:  Procedure Laterality Date   left wrist surgery Left 1966   glass cut ligaments, surgical repair   [place] History   Socioeconomic History   Marital status: Married    Spouse name: Not on file   Number of children: Not on file   Years of education: Not on file   Highest education level: Not on file  Occupational History   Occupation: retired  Tobacco Use   Smoking status: Every Day    Packs/day: 1.00    Years: 20.00    Pack years: 20.00    Types: Cigars, Cigarettes    Start date: 1965    Last attempt to quit: 08/18/2018    Years since quitting: 2.9  Smokeless tobacco: Never   Tobacco comments:    Started at age 76 - 1ppdx 77, quit x 5; pipex5 years, cigarsx15  Vaping Use   Vaping Use: Never used  Substance and Sexual Activity   Alcohol use: Yes    Comment: 1-2 oz per month    Drug use: Not Currently   Sexual activity: Yes  Other Topics Concern   Not on file  Social History Narrative   Not on file   Social Determinants of Health   Financial Resource Strain: Low Risk    Difficulty of Paying Living Expenses: Not hard at all  Food Insecurity: No Food Insecurity   Worried About Programme researcher, broadcasting/film/video in the Last Year: Never true   Ran Out of Food in the Last Year: Never true  Transportation Needs: No  Transportation Needs   Lack of Transportation (Medical): No   Lack of Transportation (Non-Medical): No  Physical Activity: Sufficiently Active   Days of Exercise per Week: 4 days   Minutes of Exercise per Session: 60 min  Stress: No Stress Concern Present   Feeling of Stress : Not at all  Social Connections: Not on file  Intimate Partner Violence: Not on file    Allergies  Allergen Reactions   Ace Inhibitors Swelling    Throat swelling; pt was on lisinopril   Angiotensin Receptor Blockers Swelling    Cross reactivity in known ACE I allergy   Lisinopril Swelling    Throat swelling   Losartan Other (See Comments)    Doctor told pt not to take    Family History  Problem Relation Age of Onset   Thyroid nodules Mother    Dementia Mother    Heart failure Father    Cancer Father    Kidney disease Son    Diabetes Brother    Cancer Brother    Heart attack Neg Hx    Stroke Neg Hx     Prior to Admission medications   Medication Sig Start Date End Date Taking? Authorizing Provider  amLODipine (NORVASC) 10 MG tablet Take 10 mg by mouth daily.    [provider]  ammonium lactate (LAC-HYDRIN) 12 % lotion Apply 1 application topically 2 (two) times daily.    [provider]  apixaban (ELIQUIS) 5 MG TABS tablet Take 1 tablet by mouth 2 times a day. Need to schedule appt with cardiologist for refills. 12/28/16   Duke Salvia, MD  atorvastatin (LIPITOR) 80 MG tablet Take 1 tablet (80 mg total) by mouth daily. Patient taking differently: Take 80 mg by mouth daily. Pt states 40mg  daily 03/24/20   05/24/20, MD  camphor-menthol [facility]) lotion Apply 1 application topically as needed for itching.    [provider]  cholecalciferol (VITAMIN D3) 10 MCG (400 UNIT) TABS tablet Take 400 Units by mouth.    [provider]  diclofenac sodium (VOLTAREN) 1 % GEL Apply 2 g topically 3 (three) times daily as needed. Patient not taking: No sig reported     [provider]  ferrous sulfate 325 (65 FE) MG tablet Take 325 mg by mouth daily with breakfast.    [provider]  potassium chloride SA (K-DUR,KLOR-CON) 20 MEQ tablet Take 40 mEq by mouth 2 (two) times daily. Morning and evening    [provider]  triamterene-hydrochlorothiazide (MAXZIDE) 75-50 MG per tablet Take 0.5 tablets by mouth every evening.     [provider]    Physical Exam: Vitals:  07/27/21 1100 07/27/21 1125 07/27/21 1130 07/27/21 1540  BP: (!) 133/94  (!) 152/101 129/80  Pulse: 81 89 89 96  Resp: 16 16 19 20   Temp:    98.2 F (36.8 C)  TempSrc:    Oral  SpO2: 96% 97% 100% 96%  Weight:      Height:         General:  Appears calm and comfortable and is in NAD Eyes:  PERRL, EOMI, normal lids, iris ENT:  grossly normal hearing, lips & tongue, mmm; appropriate dentition Neck:  no LAD, masses or thyromegaly; no carotid bruits Cardiovascular:  RRR, no m/r/g. No LE edema.  Respiratory:   CTA bilaterally with no wheezes/rales/rhonchi.  Normal respiratory effort. Abdomen:  soft, NT, ND, NABS Back:   normal alignment, no CVAT Skin:  no rash or induration seen on limited exam Musculoskeletal:  grossly normal tone BUE/BLE, good ROM, no bony abnormality. No reproducible chest pain with palpation.  Lower extremity:  No LE edema.  Limited foot exam with no ulcerations.  2+ distal pulses. Psychiatric:  grossly normal mood and affect, speech fluent and appropriate, AOx3 Neurologic:  CN 2-12 grossly intact, moves all extremities in coordinated fashion, sensation intact    Radiological Exams on Admission: Independently reviewed - see discussion in A/P where applicable  DG Chest Port 1 View  Result Date: 07/27/2021 CLINICAL DATA:  Chest pain beginning last night EXAM: PORTABLE CHEST 1 VIEW COMPARISON:  04/19/2021 FINDINGS: The heart size and mediastinal contours are within normal limits. Both lungs are clear. The visualized skeletal  structures are unremarkable. IMPRESSION: No active disease. Electronically Signed   By: Nelson Chimes M.D.   On: 07/27/2021 08:41   CT Angio Chest/Abd/Pel for Dissection W and/or W/WO  Result Date: 07/27/2021 CLINICAL DATA:  Left chest pain EXAM: CT ANGIOGRAPHY CHEST, ABDOMEN AND PELVIS TECHNIQUE: Non-contrast CT of the chest was initially obtained. Multidetector CT imaging through the chest, abdomen and pelvis was performed using the standard protocol during bolus administration of intravenous contrast. Multiplanar reconstructed images and MIPs were obtained and reviewed to evaluate the vascular anatomy. CONTRAST:  113mL OMNIPAQUE IOHEXOL 350 MG/ML SOLN COMPARISON:  02/16/2021 FINDINGS: CTA CHEST FINDINGS Cardiovascular: There is no demonstrable mural hematoma in thoracic aorta. There is homogeneous enhancement in the thoracic aorta. There is no demonstrable intimal flap. Major branches of thoracic aorta appear patent. There are no filling defects in the central pulmonary artery branches. There are scattered coronary artery calcifications. Mediastinum/Nodes: No significant lymphadenopathy seen in mediastinum. There is 1.7 cm low-density nodule in the left lobe of thyroid. There are other smaller nodules in both lobes. Lungs/Pleura: There is no focal pulmonary consolidation. There is no pleural effusion or pneumothorax. Musculoskeletal: Unremarkable. Review of the MIP images confirms the above findings. CTA ABDOMEN AND PELVIS FINDINGS VASCULAR Aorta: There is mild ectasia of abdominal aorta at the level of renal arteries measuring 3.3 cm in diameter. There is no demonstrable intimal flap. Celiac: Unremarkable. SMA: Unremarkable. Renals: No significant stenosis. IMA: Patent Inflow: Unremarkable Veins: Major venous structures are unremarkable. Review of the MIP images confirms the above findings. NON-VASCULAR Hepatobiliary: Liver measures 15.8 cm in length. There is 10 mm low-density in the right lobe in image 92  of series 6, possibly a cyst. There is possible 8 mm cyst in the liver in image 82. there is no dilation of bile ducts. Gallbladder stones are seen. Pancreas: No focal abnormality is seen. Spleen: Spleen is not enlarged. Adrenals/Urinary Tract: There is hyperplasia  of adrenals. There is no hydronephrosis. There is 3.8 cm cyst in the upper pole. In image 59, there is possible 10 mm cyst in the medial upper pole of left kidney. In the image 108 of series 6, there is 1.8 cm smooth marginated lesion with density measurements higher than usual for simple cyst in the posterolateral aspect of left kidney. In the image 116 of series 6, there is 8 mm smooth marginated exophytic lesion in the lower pole of right kidney with density measurements higher than usual for simple cyst. There are no renal or ureteral stones. Urinary bladder is not distended. Stomach/Bowel: Stomach is not distended. Small bowel loops are not dilated. Appendix is not dilated. Scattered diverticula are seen in the colon without signs of focal acute diverticulitis. Lymphatic: No significant lymphadenopathy seen. Reproductive: Unremarkable. Other: There is no ascites or pneumoperitoneum. Musculoskeletal: There is first-degree anterolisthesis at L5-S1 level. There is encroachment of neural foramina at L4-L5 and L5-S1 levels. Review of the MIP images confirms the above findings. IMPRESSION: There is no evidence of dissection in the thoracic aorta. There are scattered coronary artery calcifications. There are no filling defects in the central pulmonary artery branches. There is ectasia of abdominal aorta at the level of renal arteries measuring 3.3 cm in diameter. There is no demonstrable dissection in the abdominal aorta. Major branches of the thoracic and abdominal aorta are patent. There are nodular densities in the both lobes of thyroid largest measuring 1.7 cm in size in the left lobe. Thyroid sonogram in outpatient setting should be considered for  further characterization. There is no focal pulmonary consolidation. There is no pleural effusion or pneumothorax. There is no evidence of intestinal obstruction or pneumoperitoneum. There is no hydronephrosis. Appendix is not dilated. Gallbladder stones. There small low-density foci in the liver, possibly cysts. There is no dilation of bile ducts. Scattered coronary artery calcifications are seen. Diverticulosis of colon without signs of focal diverticulitis. Lumbar spondylosis. Bilateral renal cysts. There are few low-density lesions in both kidneys with density measurements higher than usual for simple cysts suggesting hemorrhagic cysts or solid lesions. When the patient's clinical condition permits follow-up renal sonogram may be considered. Electronically Signed   By: Elmer Picker M.D.   On: 07/27/2021 10:11   US Abdomen Limited RUQ (LIVER/GB)  Result Date: 07/27/2021 CLINICAL DATA:  Abdominal pain EXAM: ULTRASOUND ABDOMEN LIMITED RIGHT UPPER QUADRANT COMPARISON:  CT done earlier today FINDINGS: Gallbladder: There are small echogenic foci each measuring less than 6 mm in size in the lumen of gallbladder. There is no definite acoustic shadowing. Technologist did not observe any tenderness over the gallbladder. There is no fluid around the gallbladder. Common bile duct: Diameter: 6 mm Liver: No focal lesion identified. Within normal limits in parenchymal echogenicity. Small low-density foci seen in the CT could not be distinctly identified in the submitted images of the liver. Portal vein is patent on color Doppler imaging with normal direction of blood flow towards the liver. Other: None. IMPRESSION: There are small echogenic foci in the lumen of gallbladder suggesting gallbladder stones. There are no imaging signs of acute cholecystitis. Common bile duct measures 6 mm without dilation of intrahepatic bile ducts. Electronically Signed   By: Elmer Picker M.D.   On: 07/27/2021 11:15    EKG:  Independently reviewed.  NSR with rate 90 with LAFB; nonspecific ST changes with no evidence of acute ischemia. Borderline prolonged qt, new.    Labs on Admission: I have personally reviewed the available  labs and imaging studies at the time of the admission.  Pertinent labs:  troponin wnl x 2.  Covid/flu negative.   Assessment/Plan Principal Problem: Chest pain 75 year old presenting with chest pain that started last night around 9pm. Risk factors include age, sex, HLD, HTN and tobacco abuse. Heart score of 6.  -place in observation on telemetry -pain is atypical and not likely ACS related -continuing eliquis, statin -reproducible last night. Differential includes costochondritis/MSK, GERD. -troponin negative x2, nitro patch with no obvious relief and ekg with no significant changes. Cardiology consulted to see -morphine seemed to help him quite a bit. Continue pain control with tylenol, norco and morphine for severe pain only  -starting him on pepcid BID -heating pad and voltaren to chest PRN for possible costochondritis. Avoiding NSAIDs with CKD.   Active Problems:  PAF 2015 In NSR, rate controlled Continue telemetry, eliquis.  On no rate controlling drugs   CKD (chronic kidney disease) stage 3, GFR 30-59 ml/min (HCC) At baseline, continue to monitor  Bilateral renal cysts -There are few low-density lesions in both kidneys with density measurements higher than usual for simple cysts suggesting hemorrhagic cysts or solid lesions.  -renal US ordered.  -check UA   Thyroid nodules Bilateral densities, with largest 1.7cm in left lobe F/u outpatient for thyroid US     Essential hypertension Well controlled, continued home medication and home potassium     HLD (hyperlipidemia) Lipid panel 9/22: TC: 137, TG: 58, HDL: 51, LDL: 74 Continue statin     GERD (gastroesophageal reflux disease) -starting pepcid BID   Tobacco abuse  Smokes 4 cigarettes/day. Declines nicotine  patch, encouraged to stop.  Borderline prolonged qt On telemetry Follow electrolytes Avoid qt prolonging drugs    Gallstones with no cholecystitis Incidental finding   Body mass index is 30.85 kg/m.   Level of care: Telemetry Cardiac DVT prophylaxis:  eliquis Code Status:  Full - confirmed with patient Family Communication: his wife is at bedside: Merchandiser, retail Disposition Plan:  The patient is from: home  Anticipated d/c is to: home   Patient placed in observation as anticipate less than 2 midnight stay. Requires hospitalization for chest pain work up, close monitoring, and MDM with specialists.    Patient is currently: stable Consults called: cardiology  Admission status:  observation    Orma Flaming MD [facility]   How to contact the [facility] Attending or Consulting provider [place] or covering provider during after hours [place], for this patient?  Check the care team in [facility] and look for a) attending/consulting [facility] provider listed and b) the [facility] team listed Log into [email or web address] and use [place]'s universal password to access. If you do not have the password, please contact the hospital operator. Locate the [facility] provider you are looking for under [facility] and page to a number that you can be directly reached. If you still have difficulty reaching the provider, please page the [facility] (Director on Call) for the Hospitalists listed on amion for assistance.   07/27/2021, 3:53 PM

## 2021-07-27 NOTE — Progress Notes (Signed)
Received a phone call from Facility: [facility]  Requesting MD: Dr. Charm Barges  Patient with h/o HTN, HLD, PAF, presenting with chest pain. Heart score of 6. Followed by Dr. Mayford Knife and on eliquis for atrial fibrillation. Vitals stable and labs unremarkable. Troponin negative x2. Ekg with no ST changes or significant changes from previous ekg. Pain 9/10 in center of chest. Given morphine and ED doc will f/u to see if this helps. CXR clear, dissection study wnl. Needs thyroid US outpatient. US abdomen done with possible gallstones. No signs of acute cholecystitis. Hepatic panel wnl.  Coming in for cardiac w/u and continued chest pain.   Plan of care: place on telemetry, call cards.   The patient will be accepted for admission to telemetry at [facility] when bed is available.  Requested the transferring physician to try nitro patch if pain persists.   Nursing staff, Please call the [facility] Admits & Consults System-Wide number at the top of Amion at the time of the patient's arrival so that the patient can be paged to the admitting physician.   Lanney Gins M.D. [facility]

## 2021-07-27 NOTE — Consult Note (Addendum)
Cardiology Consult    Patient ID: Richard Sampson MRN: [id number], DOB/AGE: Aug 16, 1946   Admit date: 07/27/2021 Date of Consult: 07/27/2021  Primary Physician: Glendale Chard, MD Primary Cardiologist: Fransico Him, MD/EP: Olin Pia, MD  Requesting Provider: A. Rogers Blocker, MD  Patient Profile    Richard Sampson is a 75 y.o. 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 Rogers Blocker.  Past Medical History   Past Medical History:  Diagnosis Date   Agatston coronary artery calcium score between 100 and 199    a. 12/2019 Cardiac CT: 3 vessel cor Ca2+. Ca2+ = 144 (66th %i'le); b. 12/2019 MV: EF 60%, No ischemia/infarct. Ex time 7:30, Max HR 139.   Heart murmur    a. 09/2015 Echo: EF 55-60%, no rwma, GrI DD, mildly dil LA/RA.   High cholesterol    History of GIB (gastrointestinal bleeding)    a. Angiodysplasia of intestine several years ago.   Hypertension    PAF (paroxysmal atrial fibrillation) ([place]) 07/17/2014   a. CHA2DS2VASc = 3-->eliquis.   Pneumonia    Thoracic ascending aortic aneurysm    a. 09/2020 CT Chest: Stable aneurysmal dilatation of the ascending thoracic aorta measuring 4.3 cm.    Past Surgical History:  Procedure Laterality Date   left wrist surgery Left 1966   glass cut ligaments, surgical repair   TONSILLECTOMY  1955   VASECTOMY  1980     Allergies  Allergies  Allergen Reactions   Ace Inhibitors Swelling    Throat swelling; pt was on lisinopril   Angiotensin Receptor Blockers Swelling    Cross reactivity in known ACE I allergy   Lisinopril Swelling    Throat swelling   Losartan Other (See Comments)    Doctor told pt not to take    History of Present Illness    75 year old male with the above past medical history including  paroxysmal atrial fibrillation, coronary calcium noted on CT, ascending thoracic aortic aneurysm,hypertension, hyperlipidemia, cardiac murmur, and 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  Patient lives locally with his wife and is very active, exercising several times per week.  He has a history of paroxysmal atrial fibrillation that dates back several years and he has been on Eliquis without incident for at least 5 years.  He was previously followed by Dr. Caryl Comes but lost to follow-up for about 5 years prior to reestablishing care with Dr. Radford Pax in April 2021.  At that time, it was noted on CT, which was performed to follow small ascending thoracic aortic aneurysm, that he had significant three-vessel coronary calcification.  This was followed by cardiac CT for calcium scoring and showed an elevated calcium score of 144, placing him in the 66 percentile.  This was followed by an exercise Myoview, where he walked for 7 minutes and 30 seconds achieving a peak heart rate of 139 bpm.  He had no evidence of ischemia or infarct with normal LV function (60%).  Patient had been doing well and as noted, exercising regularly but beginning on the evening of December 6, he developed substernal chest discomfort that was worse with deep breathing, certain upper body position changes, and lying down.  He thought perhaps it was just reflux.  He slept poorly throughout the night secondary to  ongoing discomfort and due to ongoing symptoms this morning, who presented to the emergency department.  Here, troponins were normal x2.  ECG showed sinus rhythm with left axis deviation, left anterior fascicular block, and subtle ST segment elevation in leads I, V2-V4.  CTA of the chest abdomen and pelvis was negative for dissection and also without any evidence for PE.  Thyroid nodules were incidentally noted.  Abdominal ultrasound showed small echogenic foci in the lumen of the gallbladder suggesting gallbladder  stones without evidence of acute cholecystitis.  Patient was admitted for further evaluation.  He received fentanyl and morphine in the emergency department.  Currently, he is resting and sitting up and eating.  He continues to have pain with leaning back, sitting forward, and with deep breathing.  He is in no acute distress.  He denies any recent sick contacts or viral illnesses.  Inpatient Medications     [START ON 07/28/2021] amLODipine  10 mg Oral Daily   apixaban  5 mg Oral BID   atorvastatin  40 mg Oral Daily   cholecalciferol  400 Units Oral Daily   colchicine  0.6 mg Oral BID   famotidine  20 mg Oral BID   [START ON 07/28/2021] ferrous sulfate  325 mg Oral Q breakfast   [START ON 07/28/2021] pantoprazole  40 mg Oral Q0600   potassium chloride SA  40 mEq Oral BID   sodium chloride flush  3 mL Intravenous Q12H   triamterene-hydrochlorothiazide  0.5 tablet Oral QPM    Family History    Family History  Problem Relation Age of Onset   Thyroid nodules Mother    Dementia Mother    Heart failure Father    Cancer Father    Kidney disease Son    Diabetes Brother    Cancer Brother    Heart attack Neg Hx    Stroke Neg Hx    He indicated that his mother is deceased. He indicated that his father is deceased. He indicated that the status of his brother is unknown. He indicated that his maternal grandmother is deceased. He indicated that his maternal grandfather is deceased. He indicated that his paternal grandmother is deceased. He indicated that his paternal grandfather is deceased. He indicated that the status of his son is unknown. He indicated that the status of his neg hx is unknown.   Social History    Social History   Socioeconomic History   Marital status: Married    Spouse name: Not on file   Number of children: Not on file   Years of education: Not on file   Highest education level: Not on file  Occupational History   Occupation: retired  Tobacco Use   Smoking status:  Every Day    Packs/day: 1.00    Years: 20.00    Pack years: 20.00    Types: Cigars, Cigarettes    Start date: 1965    Last attempt to quit: 08/18/2018    Years since quitting: 2.9   Smokeless tobacco: Never   Tobacco comments:    Started at age 76 - 1ppdx 41, quit x 5; pipex5 years, cigarsx15  Vaping Use   Vaping Use: Never used  Substance and Sexual Activity   Alcohol use: Yes    Comment: 1-2 oz per month    Drug use: Not Currently   Sexual activity: Yes  Other Topics Concern   Not on file  Social History Narrative   Lives locally with wife.  2 grown children.  Retired Data processing manager. Exercises regularly @ [employer].   Social Determinants of Health   Financial Resource Strain: Low Risk    Difficulty of Paying Living Expenses: Not hard at all  Food Insecurity: No Food Insecurity   Worried About Charity fundraiser in the Last Year: Never true   [place] in the Last Year: Never true  Transportation Needs: No Transportation Needs   Lack of Transportation (Medical): No   Lack of Transportation (Non-Medical): No  Physical Activity: Sufficiently Active   Days of Exercise per Week: 4 days   Minutes of Exercise per Session: 60 min  Stress: No Stress Concern Present   Feeling of Stress : Not at all  Social Connections: Not on file  Intimate Partner Violence: Not on file     Review of Systems    General:  No chills, fever, night sweats or weight changes.  Cardiovascular:  +++ chest pain, no dyspnea on exertion, edema, orthopnea, palpitations, paroxysmal nocturnal dyspnea. Dermatological: No rash, lesions/masses Respiratory: No cough, dyspnea Urologic: No hematuria, dysuria Abdominal:   No nausea, vomiting, diarrhea, bright red blood per rectum, melena, or hematemesis Neurologic:  No visual changes, wkns, changes in mental status. All other systems reviewed and are otherwise negative except as noted above.  Physical Exam    Blood pressure  118/83, pulse 85, temperature 98.2 F (36.8 C), temperature source Oral, resp. rate 18, height 5\' 10"  (1.778 m), weight 97.5 kg, SpO2 95 %.  General: Pleasant, NAD Psych: Normal affect. Neuro: Alert and oriented X 3. Moves all extremities spontaneously. HEENT: Normal  Neck: Supple without bruits or JVD. Lungs:  Resp regular and unlabored, CTA. Heart: RRR no s3, s4.  Soft systolic murmur throughout.  No obvious rub.  Chest wall is nontender. Abdomen: Soft, non-tender, non-distended, BS + x 4.  Extremities: No clubbing, cyanosis or edema. DP/PT2+, Radials 2+ and equal bilaterally.  Labs    Cardiac Enzymes Recent Labs  Lab 07/27/21 0810 07/27/21 1035  TROPONINIHS 3 2      Lab Results  Component Value Date   WBC 5.6 07/27/2021   HGB 13.9 07/27/2021   HCT 40.9 07/27/2021   MCV 90.7 07/27/2021   PLT 208 07/27/2021    Recent Labs  Lab 07/27/21 0810  NA 140  K 3.6  CL 105  CO2 26  BUN 16  CREATININE 1.12  CALCIUM 9.6  PROT 7.4  BILITOT 0.6  ALKPHOS 75  ALT 15  AST 19  GLUCOSE 94   Lab Results  Component Value Date   CHOL 137 05/10/2021   HDL 51 05/10/2021   LDLCALC 74 05/10/2021   TRIG 58 05/10/2021   Lab Results  Component Value Date   DDIMER 0.29 07/17/2014     Radiology Studies    DG Chest Port 1 View  Result Date: 07/27/2021 CLINICAL DATA:  Chest pain beginning last night EXAM: PORTABLE CHEST 1 VIEW COMPARISON:  04/19/2021 FINDINGS: The heart size and mediastinal contours are within normal limits. Both lungs are clear. The visualized skeletal structures are unremarkable. IMPRESSION: No active disease. Electronically Signed   By: Nelson Chimes M.D.   On: 07/27/2021 08:41   CT Angio Chest/Abd/Pel for Dissection W and/or W/WO  Result Date: 07/27/2021 CLINICAL DATA:  Left chest pain EXAM: CT ANGIOGRAPHY CHEST, ABDOMEN AND PELVIS TECHNIQUE: Non-contrast CT of the chest was initially obtained. Multidetector CT imaging through the chest, abdomen and pelvis was  performed using the standard protocol  during bolus administration of intravenous contrast. Multiplanar reconstructed images and MIPs were obtained and reviewed to evaluate the vascular anatomy. CONTRAST:  OMNIPAQUE IOHEXOL 350 MG/ML SOLN COMPARISON:  02/16/2021 FINDINGS: CTA CHEST FINDINGS Cardiovascular: There is no demonstrable mural hematoma in thoracic aorta. There is homogeneous enhancement in the thoracic aorta. There is no demonstrable intimal flap. Major branches of thoracic aorta appear patent. There are no filling defects in the central pulmonary artery branches. There are scattered coronary artery calcifications. Mediastinum/Nodes: No significant lymphadenopathy seen in mediastinum. There is 1.7 cm low-density nodule in the left lobe of thyroid. There are other smaller nodules in both lobes. Lungs/Pleura: There is no focal pulmonary consolidation. There is no pleural effusion or pneumothorax. Musculoskeletal: Unremarkable. Review of the MIP images confirms the above findings. CTA ABDOMEN AND PELVIS FINDINGS VASCULAR Aorta: There is mild ectasia of abdominal aorta at the level of renal arteries measuring 3.3 cm in diameter. There is no demonstrable intimal flap. Celiac: Unremarkable. SMA: Unremarkable. Renals: No significant stenosis. IMA: Patent Inflow: Unremarkable Veins: Major venous structures are unremarkable. Review of the MIP images confirms the above findings. NON-VASCULAR Hepatobiliary: Liver measures 15.8 cm in length. There is 10 mm low-density in the right lobe in image 92 of series 6, possibly a cyst. There is possible 8 mm cyst in the liver in image 82. there is no dilation of bile ducts. Gallbladder stones are seen. Pancreas: No focal abnormality is seen. Spleen: Spleen is not enlarged. Adrenals/Urinary Tract: There is hyperplasia of adrenals. There is no hydronephrosis. There is 3.8 cm cyst in the upper pole. In image 34, there is possible 10 mm cyst in the medial upper pole of left  kidney. In the image 108 of series 6, there is 1.8 cm smooth marginated lesion with density measurements higher than usual for simple cyst in the posterolateral aspect of left kidney. In the image 116 of series 6, there is 8 mm smooth marginated exophytic lesion in the lower pole of right kidney with density measurements higher than usual for simple cyst. There are no renal or ureteral stones. Urinary bladder is not distended. Stomach/Bowel: Stomach is not distended. Small bowel loops are not dilated. Appendix is not dilated. Scattered diverticula are seen in the colon without signs of focal acute diverticulitis. Lymphatic: No significant lymphadenopathy seen. Reproductive: Unremarkable. Other: There is no ascites or pneumoperitoneum. Musculoskeletal: There is first-degree anterolisthesis at L5-S1 level. There is encroachment of neural foramina at L4-L5 and L5-S1 levels. Review of the MIP images confirms the above findings. IMPRESSION: There is no evidence of dissection in the thoracic aorta. There are scattered coronary artery calcifications. There are no filling defects in the central pulmonary artery branches. There is ectasia of abdominal aorta at the level of renal arteries measuring 3.3 cm in diameter. There is no demonstrable dissection in the abdominal aorta. Major branches of the thoracic and abdominal aorta are patent. There are nodular densities in the both lobes of thyroid largest measuring 1.7 cm in size in the left lobe. Thyroid sonogram in outpatient setting should be considered for further characterization. There is no focal pulmonary consolidation. There is no pleural effusion or pneumothorax. There is no evidence of intestinal obstruction or pneumoperitoneum. There is no hydronephrosis. Appendix is not dilated. Gallbladder stones. There small low-density foci in the liver, possibly cysts. There is no dilation of bile ducts. Scattered coronary artery calcifications are seen. Diverticulosis of colon  without signs of focal diverticulitis. Lumbar spondylosis. Bilateral renal cysts. There are few  low-density lesions in both kidneys with density measurements higher than usual for simple cysts suggesting hemorrhagic cysts or solid lesions. When the patient's clinical condition permits follow-up renal sonogram may be considered. Electronically Signed   By: Elmer Picker M.D.   On: 07/27/2021 10:11   US Abdomen Limited RUQ (LIVER/GB)  Result Date: 07/27/2021 CLINICAL DATA:  Abdominal pain EXAM: ULTRASOUND ABDOMEN LIMITED RIGHT UPPER QUADRANT COMPARISON:  CT done earlier today FINDINGS: Gallbladder: There are small echogenic foci each measuring less than 6 mm in size in the lumen of gallbladder. There is no definite acoustic shadowing. Technologist did not observe any tenderness over the gallbladder. There is no fluid around the gallbladder. Common bile duct: Diameter: 6 mm Liver: No focal lesion identified. Within normal limits in parenchymal echogenicity. Small low-density foci seen in the CT could not be distinctly identified in the submitted images of the liver. Portal vein is patent on color Doppler imaging with normal direction of blood flow towards the liver. Other: None. IMPRESSION: There are small echogenic foci in the lumen of gallbladder suggesting gallbladder stones. There are no imaging signs of acute cholecystitis. Common bile duct measures 6 mm without dilation of intrahepatic bile ducts. Electronically Signed   By: Elmer Picker M.D.   On: 07/27/2021 11:15    ECG & Cardiac Imaging    Regular sinus rhythm, 90 bpm, left axis deviation, left anterior fascicular block, subtle ST elevation in leads I and V2 through V4- personally reviewed.  Assessment & Plan    1.  Chest pain/presumed acute pericarditis: Patient presented to the emergency department earlier today with a 12+ hour history of chest discomfort that was worse with deep breathing and certain upper body position changes  including lying down.  His ECG shows subtle J-point/ST elevation in lead I and V2 through V4.  On telemetry, this is even more pronounced in lead II.  Despite prolonged symptoms, troponins are normal at 3  2.  No recent sick contacts or viral illnesses.  White count is normal and he is afebrile.  No evidence of pericardial effusion on CTA of the chest.  CTA was also negative for dissection or large pulmonary filling defects.  Symptoms and ECG findings most consistent with pericarditis.  We will add colchicine 0.6 mg twice daily and check a sed rate and CRP.  Avoiding NSAIDs in the setting of prior history of GI bleed and ongoing Eliquis therapy.  Follow-up echo.  In the setting of normal troponins, agree with primary team that this does not appear to be acute coronary syndrome.  2.  Essential hypertension: Currently stable on his home regimen.  3.  Paroxysmal atrial fibrillation: In sinus rhythm.  No recent palpitations.  He is anticoagulated with Eliquis.  He is not on any AV nodal blocking agents.  4.  Coronary calcifications: Elevated coronary calcium score last year with good exercise tolerance on stress testing May 2021 with no evidence of ischemia or infarct by imaging.  As above, normal troponins.  Continue statin therapy.  5.  Ascending thoracic aortic aneurysm: No evidence of dissection on CT this admission.  Signed, Murray Hodgkins, NP 07/27/2021, 6:31 PM  For questions or updates, please contact   Please consult [email or web address] for contact info under Cardiology/STEMI.   Patient seen and examined. Agree with assessment and plan.  Richard Sampson is a very pleasant 75 year old gentleman who is retired from [employer] after 23 years of service.  He has a remote history of PAF over  5 years ago for which she was placed on Eliquis anticoagulation and has been on it ever since.  There also was a remote history of mild lower GI bleeding related to angiodysplasia of his intestine.  He has  documented coronary calcification and on cardiac CT his calcium score was 144 placing him in the 66 percentile.  A subsequent exercise Myoview study was negative for ischemia or scar.  The patient remains active and exercises.  Last evening he began to notice a chest sensation which seem to be aggravated by taking a deep breath, lying back, or positional changes.  He felt it most likely was due to GI reflux but symptoms continued ultimately leading to his ER evaluation with subsequent admission.  He denies any fevers chills or night sweats or antecedent viral illness.  Initial troponins are normal and very low.  ECG shows sinus rhythm with subtle J-point elevation which seems now more pronounced on telemetry suggesting possible pericardial ECG changes.  On exam, blood pressure is stable at 118/83.  His pulse is regular in the 80s.  He is afebrile.  HEENT is unremarkable.  There is no JVD.  Lungs were clear.  Rhythm was regular with a 2/6 systolic murmur.  In the supine position there was a questionable possible faint rub at the apex which was not heard sitting.  There were no gallops.  Abdomen was nontender.  There was no clubbing cyanosis or edema.  Neurologic was grossly normal.  Presently, will obtain sed rate and CRP laboratory.  We will empirically add colchicine 0.6 mg twice a day.  At present we will avoid nonsteroidal anti-inflammatory agents pending further symptomatology in light of ongoing anticoagulation and remote bleed history.  We will schedule him for 2D echo Doppler study.  At present his blood pressure is stable on his current regimen.  He had been previously noted to have mild ascending thoracic aortic aneurysm.  CT imaging does not reveal any evidence for dissection.  Incidentally, CT also raise concern for possible small gallstones.  We will follow   Richard Sine, MD, [facility] 07/27/2021 6:48 PM

## 2021-07-28 ENCOUNTER — Observation Stay (HOSPITAL_BASED_OUTPATIENT_CLINIC_OR_DEPARTMENT_OTHER): Payer: Medicare PPO

## 2021-07-28 ENCOUNTER — Other Ambulatory Visit (HOSPITAL_COMMUNITY): Payer: Self-pay

## 2021-07-28 DIAGNOSIS — R9431 Abnormal electrocardiogram [ECG] [EKG]: Secondary | ICD-10-CM

## 2021-07-28 DIAGNOSIS — R071 Chest pain on breathing: Secondary | ICD-10-CM | POA: Diagnosis not present

## 2021-07-28 DIAGNOSIS — R079 Chest pain, unspecified: Secondary | ICD-10-CM

## 2021-07-28 DIAGNOSIS — R011 Cardiac murmur, unspecified: Secondary | ICD-10-CM | POA: Diagnosis not present

## 2021-07-28 DIAGNOSIS — Z720 Tobacco use: Secondary | ICD-10-CM

## 2021-07-28 DIAGNOSIS — I10 Essential (primary) hypertension: Secondary | ICD-10-CM | POA: Diagnosis not present

## 2021-07-28 DIAGNOSIS — R072 Precordial pain: Secondary | ICD-10-CM | POA: Diagnosis not present

## 2021-07-28 DIAGNOSIS — I480 Paroxysmal atrial fibrillation: Secondary | ICD-10-CM | POA: Diagnosis not present

## 2021-07-28 DIAGNOSIS — K8020 Calculus of gallbladder without cholecystitis without obstruction: Secondary | ICD-10-CM | POA: Diagnosis not present

## 2021-07-28 DIAGNOSIS — Z7901 Long term (current) use of anticoagulants: Secondary | ICD-10-CM | POA: Diagnosis not present

## 2021-07-28 DIAGNOSIS — E7800 Pure hypercholesterolemia, unspecified: Secondary | ICD-10-CM | POA: Diagnosis not present

## 2021-07-28 DIAGNOSIS — N1831 Chronic kidney disease, stage 3a: Secondary | ICD-10-CM | POA: Diagnosis not present

## 2021-07-28 DIAGNOSIS — K219 Gastro-esophageal reflux disease without esophagitis: Secondary | ICD-10-CM | POA: Diagnosis not present

## 2021-07-28 LAB — ECHOCARDIOGRAM COMPLETE
Area-P 1/2: 2.32 cm2
Height: 70 in
S' Lateral: 3 cm
Weight: 3440 oz

## 2021-07-28 LAB — BASIC METABOLIC PANEL
Anion gap: 8 (ref 5–15)
BUN: 14 mg/dL (ref 8–23)
CO2: 26 mmol/L (ref 22–32)
Calcium: 9 mg/dL (ref 8.9–10.3)
Chloride: 103 mmol/L (ref 98–111)
Creatinine, Ser: 1.17 mg/dL (ref 0.61–1.24)
GFR, Estimated: >60 mL/min (ref >60)
Glucose, Bld: 83 mg/dL (ref 70–99)
Potassium: 3.1 mmol/L — ABNORMAL LOW (ref 3.5–5.1)
Sodium: 137 mmol/L (ref 135–145)

## 2021-07-28 MED ORDER — PANTOPRAZOLE SODIUM 40 MG PO TBEC: 40.0000 mg | DELAYED_RELEASE_TABLET | Freq: Every day | ORAL | 0 refills | Status: DC

## 2021-07-28 MED ORDER — POTASSIUM CHLORIDE CRYS ER 20 MEQ PO TBCR
40.0000 meq | EXTENDED_RELEASE_TABLET | Freq: Once | ORAL | Status: AC
  Administered 2021-07-28: 40 meq via ORAL
  Filled 2021-07-28: qty 2

## 2021-07-28 NOTE — Progress Notes (Addendum)
Progress Note  Patient Name: Richard Sampson Date of Encounter: 07/28/2021  Primary Cardiologist: Dr. Tressia Miners Turner/EP: Dr. Caryl Comes  Subjective   Is much better with resolution of prior inspiratory chest pain or pain which worsened on his back.  Inpatient Medications    Scheduled Meds:  amLODipine  10 mg Oral Daily   apixaban  5 mg Oral BID   atorvastatin  40 mg Oral Daily   cholecalciferol  400 Units Oral Daily   colchicine  0.6 mg Oral BID   ferrous sulfate  325 mg Oral Q breakfast   pantoprazole  40 mg Oral Q0600   potassium chloride SA  40 mEq Oral BID   sodium chloride flush  3 mL Intravenous Q12H   triamterene-hydrochlorothiazide  0.5 tablet Oral QPM   Continuous Infusions:  sodium chloride     PRN Meds: sodium chloride, acetaminophen **OR** acetaminophen, diclofenac Sodium, HYDROcodone-acetaminophen, morphine injection, sodium chloride flush   Vital Signs    Vitals:   07/27/21 1641 07/27/21 2000 07/28/21 0519 07/28/21 0809  BP: 118/83 111/77 106/66 115/72  Pulse: 85 83 76 81  Resp: 18 (!) 21 19   Temp:  98.7 F (37.1 C) 98.7 F (37.1 C)   TempSrc:  Oral Oral   SpO2: 95% 97% 94%   Weight:      Height:        Intake/Output Summary (Last 24 hours) at 07/28/2021 1000 Last data filed at 07/28/2021 0500 Gross per 24 hour  Intake 360 ml  Output 250 ml  Net 110 ml    I/O since admission:   Filed Weights   07/27/21 0808  Weight: 97.5 kg    Telemetry    Sinus in the 70s with early repolarization- Personally Reviewed  ECG    ECG (independently read by me): pending today  07/27/2021 ECG (independently read by me): Normal sinus rhythm at 90 with first-degree AV block and with subtle J-point elevation  Physical Exam    BP 115/72   Pulse 81   Temp 98.7 F (37.1 C) (Oral)   Resp 19   Ht _0  (1.778 m)   Wt 97.5 kg   SpO2 94%   BMI 30.85 kg/m  General: Alert, oriented, no distress.  Skin: normal turgor, no rashes, warm and dry HEENT:  Normocephalic, atraumatic. Pupils equal round and reactive to light; sclera anicteric; extraocular muscles intact;    Nose without nasal septal hypertrophy Mouth/Parynx benign; Mallinpatti scale Neck: No JVD, no carotid bruits; normal carotid upstroke Lungs: clear to ausculatation and percussion; no wheezing or rales Chest wall: without tenderness to palpitation Heart: PMI not displaced, RRR, s1 s2 normal, 9-6/7 systolic murmur LSB and apex, no diastolic murmur, no rubs, gallops, thrills, or heaves Abdomen: soft, nontender; no hepatosplenomehaly, BS+; abdominal aorta nontender and not dilated by palpation. Back: no CVA tenderness Pulses 2+ Musculoskeletal: full range of motion, normal strength, no joint deformities Extremities: no clubbing cyanosis or edema, Homan's sign negative  Neurologic: grossly nonfocal; Cranial nerves grossly wnl Psychologic: Normal mood and affect   Labs    Chemistry Recent Labs  Lab 07/27/21 0810 07/28/21 0129  NA 140 137  K 3.6 3.1*  CL 105 103  CO2 26 26  GLUCOSE 94 83  BUN 16 14  CREATININE 1.12 1.17  CALCIUM 9.6 9.0  PROT 7.4  --   ALBUMIN 4.1  --   AST 19  --   ALT 15  --   ALKPHOS 75  --  BILITOT 0.6  --   GFRNONAA >60 >60  ANIONGAP 9 8     Hematology Recent Labs  Lab 07/27/21 0810  WBC 5.6  RBC 4.51  HGB 13.9  HCT 40.9  MCV 90.7  MCH 30.8  MCHC 34.0  RDW 13.9  PLT 208    CRP 2.1  ESR 15  Cardiac EnzymesNo results for input(s): TROPONINI in the last 168 hours. No results for input(s): TROPIPOC in the last 168 hours.   BNPNo results for input(s): BNP, PROBNP in the last 168 hours.   DDimer No results for input(s): DDIMER in the last 168 hours.   Lipid Panel     Component Value Date/Time   CHOL 137 05/10/2021 0935   TRIG 58 05/10/2021 0935   HDL 51 05/10/2021 0935   CHOLHDL 2.7 05/10/2021 0935   LDLCALC 74 05/10/2021 0935     Radiology    US RENAL  Result Date: 07/27/2021 CLINICAL DATA:  Indeterminate renal  cyst EXAM: RENAL / URINARY TRACT ULTRASOUND COMPLETE COMPARISON:  CT 07/27/2021 FINDINGS: Right Kidney: Renal measurements: 11.1 x 6.0 x 5.2 cm = volume: 182 mL. Echogenicity within normal limits. No mass or hydronephrosis visualized. Left Kidney: Renal measurements: 10.2 x 6.5 x 4.6 cm = volume: 158 mL. Echogenicity within normal limits. No mass or hydronephrosis visualized. At least 2 simple cortical cysts are seen within the left kidney corresponding to the lesions noted on prior examination. The largest is seen within the upper pole measuring 3.6 x 3.4 x 3.4 cm. Bladder: Appears normal for degree of bladder distention. Other: None. IMPRESSION: Previously identified cortical cysts represent simple cortical cysts (Bosniak class 1). Further follow-up is not required. Electronically Signed   By: Fidela Salisbury M.D.   On: 07/27/2021 23:04   DG Chest Port 1 View  Result Date: 07/27/2021 CLINICAL DATA:  Chest pain beginning last night EXAM: PORTABLE CHEST 1 VIEW COMPARISON:  04/19/2021 FINDINGS: The heart size and mediastinal contours are within normal limits. Both lungs are clear. The visualized skeletal structures are unremarkable. IMPRESSION: No active disease. Electronically Signed   By: Nelson Chimes M.D.   On: 07/27/2021 08:41   CT Angio Chest/Abd/Pel for Dissection W and/or W/WO  Result Date: 07/27/2021 CLINICAL DATA:  Left chest pain EXAM: CT ANGIOGRAPHY CHEST, ABDOMEN AND PELVIS TECHNIQUE: Non-contrast CT of the chest was initially obtained. Multidetector CT imaging through the chest, abdomen and pelvis was performed using the standard protocol during bolus administration of intravenous contrast. Multiplanar reconstructed images and MIPs were obtained and reviewed to evaluate the vascular anatomy. CONTRAST:  131m OMNIPAQUE IOHEXOL 350 MG/ML SOLN COMPARISON:  02/16/2021 FINDINGS: CTA CHEST FINDINGS Cardiovascular: There is no demonstrable mural hematoma in thoracic aorta. There is homogeneous  enhancement in the thoracic aorta. There is no demonstrable intimal flap. Major branches of thoracic aorta appear patent. There are no filling defects in the central pulmonary artery branches. There are scattered coronary artery calcifications. Mediastinum/Nodes: No significant lymphadenopathy seen in mediastinum. There is 1.7 cm low-density nodule in the left lobe of thyroid. There are other smaller nodules in both lobes. Lungs/Pleura: There is no focal pulmonary consolidation. There is no pleural effusion or pneumothorax. Musculoskeletal: Unremarkable. Review of the MIP images confirms the above findings. CTA ABDOMEN AND PELVIS FINDINGS VASCULAR Aorta: There is mild ectasia of abdominal aorta at the level of renal arteries measuring 3.3 cm in diameter. There is no demonstrable intimal flap. Celiac: Unremarkable. SMA: Unremarkable. Renals: No significant stenosis. IMA: Patent Inflow: Unremarkable Veins:  Major venous structures are unremarkable. Review of the MIP images confirms the above findings. NON-VASCULAR Hepatobiliary: Liver measures 15.8 cm in length. There is 10 mm low-density in the right lobe in image 92 of series 6, possibly a cyst. There is possible 8 mm cyst in the liver in image 82. there is no dilation of bile ducts. Gallbladder stones are seen. Pancreas: No focal abnormality is seen. Spleen: Spleen is not enlarged. Adrenals/Urinary Tract: There is hyperplasia of adrenals. There is no hydronephrosis. There is 3.8 cm cyst in the upper pole. In image 14, there is possible 10 mm cyst in the medial upper pole of left kidney. In the image 108 of series 6, there is 1.8 cm smooth marginated lesion with density measurements higher than usual for simple cyst in the posterolateral aspect of left kidney. In the image 116 of series 6, there is 8 mm smooth marginated exophytic lesion in the lower pole of right kidney with density measurements higher than usual for simple cyst. There are no renal or ureteral  stones. Urinary bladder is not distended. Stomach/Bowel: Stomach is not distended. Small bowel loops are not dilated. Appendix is not dilated. Scattered diverticula are seen in the colon without signs of focal acute diverticulitis. Lymphatic: No significant lymphadenopathy seen. Reproductive: Unremarkable. Other: There is no ascites or pneumoperitoneum. Musculoskeletal: There is first-degree anterolisthesis at L5-S1 level. There is encroachment of neural foramina at L4-L5 and L5-S1 levels. Review of the MIP images confirms the above findings. IMPRESSION: There is no evidence of dissection in the thoracic aorta. There are scattered coronary artery calcifications. There are no filling defects in the central pulmonary artery branches. There is ectasia of abdominal aorta at the level of renal arteries measuring 3.3 cm in diameter. There is no demonstrable dissection in the abdominal aorta. Major branches of the thoracic and abdominal aorta are patent. There are nodular densities in the both lobes of thyroid largest measuring 1.7 cm in size in the left lobe. Thyroid sonogram in outpatient setting should be considered for further characterization. There is no focal pulmonary consolidation. There is no pleural effusion or pneumothorax. There is no evidence of intestinal obstruction or pneumoperitoneum. There is no hydronephrosis. Appendix is not dilated. Gallbladder stones. There small low-density foci in the liver, possibly cysts. There is no dilation of bile ducts. Scattered coronary artery calcifications are seen. Diverticulosis of colon without signs of focal diverticulitis. Lumbar spondylosis. Bilateral renal cysts. There are few low-density lesions in both kidneys with density measurements higher than usual for simple cysts suggesting hemorrhagic cysts or solid lesions. When the patient's clinical condition permits follow-up renal sonogram may be considered. Electronically Signed   By: Elmer Picker M.D.   On:  07/27/2021 10:11   US Abdomen Limited RUQ (LIVER/GB)  Result Date: 07/27/2021 CLINICAL DATA:  Abdominal pain EXAM: ULTRASOUND ABDOMEN LIMITED RIGHT UPPER QUADRANT COMPARISON:  CT done earlier today FINDINGS: Gallbladder: There are small echogenic foci each measuring less than 6 mm in size in the lumen of gallbladder. There is no definite acoustic shadowing. Technologist did not observe any tenderness over the gallbladder. There is no fluid around the gallbladder. Common bile duct: Diameter: 6 mm Liver: No focal lesion identified. Within normal limits in parenchymal echogenicity. Small low-density foci seen in the CT could not be distinctly identified in the submitted images of the liver. Portal vein is patent on color Doppler imaging with normal direction of blood flow towards the liver. Other: None. IMPRESSION: There are small echogenic foci in  the lumen of gallbladder suggesting gallbladder stones. There are no imaging signs of acute cholecystitis. Common bile duct measures 6 mm without dilation of intrahepatic bile ducts. Electronically Signed   By: Elmer Picker M.D.   On: 07/27/2021 11:15    Cardiac Studies   Echo scheduled for today  Patient Profile     ABDIRIZAK RICHISON is a 75 y.o. 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 Rogers Blocker.    Assessment & Plan    Nonischemic chest pain with pleuritic symptoms: Possible pericarditis.  Sed rate 15, CRP 2.1.  We will repeat ECG today.  Patient was started empirically on colchicine 0.6 mg twice a day yesterday.  Troponins were normal at 3 and 2.  No antecedent viral illness.  Await 2D echo Doppler study today. Cardiac murmur: We will obtain 2D echo Doppler study today to assess LV systolic and diastolic function,  valvular architecture, and pericardium. PAF: Maintaining sinus rhythm, has been on Eliquis for over 5 years. Coronary calcifications: Mild elevated calcium score with normal nuclear stress testing in May 2021.  Continue statin therapy with target LDL less than 70. Ascending thoracic or aneurysm: No evidence for dissection Gallstones: Noted on CT; no signs of cholecystitis GERD: Started on pantoprazole yesterday.  Signed, Troy Sine, MD, [facility] 07/28/2021, 10:00 AM

## 2021-07-28 NOTE — Discharge Summary (Addendum)
Physician Discharge Summary  RENNIE ROUCH PXT:[id number] DOB: [birth year] DOA: 07/27/2021  PCP: Glendale Chard, MD  Admit date: 07/27/2021 Discharge date: 07/28/2021  Admitted From: Home Disposition: Home   Recommendations for Outpatient Follow-up:  Follow up with PCP in 1-2 weeks, consider GI evaluation. Started PPI for atypical chest pain with GERD features.   Home Health: None Equipment/Devices: None Discharge Condition: Stable CODE STATUS: Full Diet recommendation: Heart healthy  Brief/Interim Summary: Richard Sampson is a 75 y.o. 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 He states yesterday evening around 9:00pm he started to feel tightness around his neck and as the night progressed moved down into the center of his chest. Pain was worse with deep inspiration. He thought it would go away, but the pain woke him up several times during the night. Pain is substernal and pain rated as a 9/10 and described as a pulled muscle. He feels like it radiated down the right side of his neck into his right side a few times, but not up his jaw or down his left arm. He has no associated diaphoresis, shortness of breath, dizziness. Pain initially came and went and then during the night it seemed to be more constant. Pain seemed worse with exertion and breathing. Moving arm did not make it worse, not associated with food, but he hasn't eaten. He tried some tylenol at home with no relief. He also states pain was reproducible, but not as much today. No heavy lifting and no worse with arm movements.    Denies any fever/chills, headaches/vision changes, shortness of breath, cough, abdominal pain, N/V/D, leg swelling, dysuria/frequency or urgency. Has palpitations at times.    Risk factors include HTN, HLD, tobacco abuse. He has no family  history of heart disease. He has been riding his bike 5-15 miles up until last week with no issues.    Smokes 4 cigarettes/day and no alcohol.    He has a history of GERD, but is on no px medication. He states when he eats, especially spicy foods, he will have burning in his chest. Takes tums with little relief.    ED Course: vitals: afebrile, bp: 144/97, HR: 89, RR: 21, oxygen: 98% RA Pertinent labs: troponin wnl x 2. Covid/flu negative. Ekg NSR with rate of 90, borderline prolonged qt, LAFB. No significant changes.  In ED: given fentanyl, morphine and nitro paste. TRH asked to admit for chest pain.   Discharge Diagnoses:  Principal Problem:   Chest pain Active Problems:   HLD (hyperlipidemia)   PAF 2015   Tobacco abuse   GERD (gastroesophageal reflux disease)   CKD (chronic kidney disease) stage 3, GFR 30-59 ml/min (HCC)   Essential hypertension   Prolonged QT interval   Gallstones   Anticoagulated   Cardiac murmur  Atypical chest pain: Primary consideration after negative ischemic evaluation was pericarditis though no pericardial abnormality on echo and ESR is normal at 15. Empirically started colchicine, though with this negative evaluation, cardiology does not recommend empirically continuing colchicine. NSAIDs not used due to CKD. Has improved with PPI for possible GERD/esophageal spasm, which will be continued.   LVH: LVEF 65-70% by echo without wall motion abnormalities. Basal septum shows severe hypertrophy with moderate hypertrophy elsewhere without LVOT obstruction, grade 1 DD, moderate LAE, mild RAE, trivial regurgitation is the most noted valvular abnormality.  -  Control BP. Discussed findings with cardiology, Dr. Claiborne Billings who feels this can be followed up as an outpatient without further evaluation or changes in management here.   PAF: NSR. Continue home meds  HLD: Continue statin  Ascending thoracic aorta aneurysm: Surveillance as outpatient recommended.   Obesity:  Estimated body mass index is 30.85 kg/m as calculated from the following:   Height as of this encounter: _0  (1.778 m).   Weight as of this encounter: 97.5 kg.  Discharge Instructions Discharge Instructions     Diet - low sodium heart healthy   Complete by: As directed    Discharge instructions   Complete by: As directed    You were evaluated for chest pain that is not felt to be due to any ischemic heart disease. Your echocardiogram showed normal functioning and no significant abnormality with the valves. There was no evidence of pericarditis, so we will not continue the colchicine at discharge. Your symptoms have improved with initiation of pantoprazole which we can continue daily (prescribed at discharge).   A thyroid nodule was noticed on your imaging while here. An outpatient work up is recommended, which can be managed by your primary doctor. If your symptoms return, seek medical attention right away. Otherwise, follow up with your PCP in the next 1-2 weeks.      Allergies as of 07/28/2021       Reactions   Ace Inhibitors Swelling   Throat swelling; pt was on lisinopril   Angiotensin Receptor Blockers Swelling   Cross reactivity in known ACE I allergy   Lisinopril Swelling   Throat swelling   Losartan Other (See Comments)   Doctor told pt not to take        Medication List     STOP taking these medications    ammonium lactate 12 % lotion Commonly known as: LAC-HYDRIN   camphor-menthol lotion Commonly known as: SARNA   diclofenac sodium 1 % Gel Commonly known as: VOLTAREN       TAKE these medications    acetaminophen 325 MG tablet Commonly known as: TYLENOL Take 650 mg by mouth every 6 (six) hours as needed for moderate pain or mild pain.   amLODipine 10 MG tablet Commonly known as: NORVASC Take 10 mg by mouth daily.   apixaban 5 MG Tabs tablet Commonly known as: ELIQUIS Take 1 tablet by mouth 2 times a day. Need to schedule appt with cardiologist  for refills.   atorvastatin 80 MG tablet Commonly known as: Lipitor Take 1 tablet (80 mg total) by mouth daily. What changed: additional instructions   calcium carbonate 750 MG chewable tablet Commonly known as: TUMS EX Chew 2 tablets by mouth daily as needed for heartburn.   cholecalciferol 10 MCG (400 UNIT) Tabs tablet Commonly known as: VITAMIN D3 Take 400 Units by mouth.   ferrous sulfate 325 (65 FE) MG tablet Take 325 mg by mouth daily with breakfast.   pantoprazole 40 MG tablet Commonly known as: PROTONIX Take 1 tablet (40 mg total) by mouth daily at 6 (six) AM. Start taking on: July 29, 2021   potassium chloride SA 20 MEQ tablet Commonly known as: KLOR-CON M Take 40 mEq by mouth 2 (two) times daily. Morning and evening   triamterene-hydrochlorothiazide 75-50 MG tablet Commonly known as: MAXZIDE Take 0.5 tablets by mouth every evening.        Follow-up Information     Glendale Chard, MD Follow up.   Specialty: Internal Medicine Contact information: [address] [place] [address] [phone]         Sueanne Margarita, MD .   Specialty: Cardiology Contact information: [phone] [place] [address] [phone]                Allergies  Allergen Reactions   Ace Inhibitors Swelling    Throat swelling; pt was on lisinopril   Angiotensin Receptor Blockers Swelling    Cross reactivity in known ACE I allergy   Lisinopril Swelling    Throat swelling   Losartan Other (See Comments)    Doctor told pt not to take    Consultations: Cardiology  Procedures/Studies: US RENAL  Result Date: 07/27/2021 CLINICAL DATA:  Indeterminate renal cyst EXAM: RENAL / URINARY TRACT ULTRASOUND COMPLETE COMPARISON:  CT 07/27/2021 FINDINGS: Right Kidney: Renal measurements: 11.1 x 6.0 x 5.2 cm = volume: 182 mL. Echogenicity within normal limits. No mass or hydronephrosis visualized. Left Kidney: Renal measurements: 10.2 x 6.5  x 4.6 cm = volume: 158 mL. Echogenicity within normal limits. No mass or hydronephrosis visualized. At least 2 simple cortical cysts are seen within the left kidney corresponding to the lesions noted on prior examination. The largest is seen within the upper pole measuring 3.6 x 3.4 x 3.4 cm. Bladder: Appears normal for degree of bladder distention. Other: None. IMPRESSION: Previously identified cortical cysts represent simple cortical cysts (Bosniak class 1). Further follow-up is not required. Electronically Signed   By: Fidela Salisbury M.D.   On: 07/27/2021 23:04   DG Chest Port 1 View  Result Date: 07/27/2021 CLINICAL DATA:  Chest pain beginning last night EXAM: PORTABLE CHEST 1 VIEW COMPARISON:  04/19/2021 FINDINGS: The heart size and mediastinal contours are within normal limits. Both lungs are clear. The visualized skeletal structures are unremarkable. IMPRESSION: No active disease. Electronically Signed   By: Nelson Chimes M.D.   On: 07/27/2021 08:41   ECHOCARDIOGRAM COMPLETE  Result Date: 07/28/2021    ECHOCARDIOGRAM REPORT   Patient Name:   PINCHOS TOPEL Date of Exam: 07/28/2021 Medical Rec #:  [id number]        Height:       70.0 in Accession #:    [id number]       Weight:       215.0 lb Date of Birth:  1945/11/22        BSA:          2.152 m Patient Age:    36 years         BP:           115/72 mmHg Patient Gender: M                HR:           64 bpm. Exam Location:  Inpatient Procedure: 2D Echo Indications:    chest pain  History:        Patient has prior history of Echocardiogram examinations, most                 recent 10/18/2015. Chronic kidney disease, Signs/Symptoms:Murmur                 and Chest Pain; Risk Factors:Dyslipidemia and Current Smoker.  Sonographer:    Johny Chess RDCS Referring Phys: [place]  1. Left ventricular ejection fraction, by estimation, is 65 to 70%. The left ventricle has normal function. The left ventricle has no regional  wall motion abnormalities. There  is severe hypertrophy of the basal septum. The rest of the LV segments demonstrate moderate left ventricular hypertrophy. There is no LVOTO. Left ventricular diastolic parameters are consistent with Grade I diastolic dysfunction (impaired relaxation).  2. Right ventricular systolic function is normal. The right ventricular size is normal. There is normal pulmonary artery systolic pressure.  3. Left atrial size was moderately dilated.  4. Right atrial size was mildly dilated.  5. The mitral valve is degenerative. Trivial mitral valve regurgitation.  6. The aortic valve is tricuspid. There is mild calcification of the aortic valve. There is mild thickening of the aortic valve. Aortic valve regurgitation is trivial. Aortic valve sclerosis/calcification is present, without any evidence of aortic stenosis.  7. Aortic dilatation noted. There is mild dilatation of the aortic root, measuring 41 mm. There is moderate dilatation of the ascending aorta, measuring 43 mm.  8. The inferior vena cava is normal in size with greater than 50% respiratory variability, suggesting right atrial pressure of 3 mmHg. Comparison(s): Compared to prior echo report in 2017, the aortic root was 30m on prior and now 424m Ascending aorta size was not reported but noted as mildly dilated (now moderately). FINDINGS  Left Ventricle: Left ventricular ejection fraction, by estimation, is 65 to 70%. The left ventricle has normal function. The left ventricle has no regional wall motion abnormalities. The left ventricular internal cavity size was normal in size. There is  severe hypertrophy of the basal septum. The rest of the LV segments demonstrate moderate left ventricular hypertrophy. Left ventricular diastolic parameters are consistent with Grade I diastolic dysfunction (impaired relaxation). Right Ventricle: The right ventricular size is normal. No increase in right ventricular wall thickness. Right ventricular  systolic function is normal. There is normal pulmonary artery systolic pressure. The tricuspid regurgitant velocity is 1.86 m/s, and  with an assumed right atrial pressure of 3 mmHg, the estimated right ventricular systolic pressure is 1600.7mHg. Left Atrium: Left atrial size was moderately dilated. Right Atrium: Right atrial size was mildly dilated. Pericardium: There is no evidence of pericardial effusion. Mitral Valve: The mitral valve is degenerative in appearance. There is mild thickening of the mitral valve leaflet(s). There is mild calcification of the mitral valve leaflet(s). Mild to moderate mitral annular calcification. Trivial mitral valve regurgitation. Tricuspid Valve: The tricuspid valve is normal in structure. Tricuspid valve regurgitation is trivial. Aortic Valve: The aortic valve is tricuspid. There is mild calcification of the aortic valve. There is mild thickening of the aortic valve. Aortic valve regurgitation is trivial. Aortic valve sclerosis/calcification is present, without any evidence of aortic stenosis. Pulmonic Valve: The pulmonic valve was normal in structure. Pulmonic valve regurgitation is trivial. Aorta: Aortic dilatation noted. There is mild dilatation of the aortic root, measuring 41 mm. There is moderate dilatation of the ascending aorta, measuring 43 mm. Venous: The inferior vena cava is normal in size with greater than 50% respiratory variability, suggesting right atrial pressure of 3 mmHg. IAS/Shunts: No atrial level shunt detected by color flow Doppler.  LEFT VENTRICLE PLAX 2D LVIDd:         4.00 cm   Diastology LVIDs:         3.00 cm   LV e' medial:    5.22 cm/s LV PW:         1.20 cm   LV E/e' medial:  13.0 LV IVS:        1.60 cm   LV e' lateral:   8.27 cm/s LVOT diam:  2.10 cm   LV E/e' lateral: 8.2 LV SV:         104 LV SV Index:   48 LVOT Area:     3.46 cm  RIGHT VENTRICLE             IVC RV S prime:     11.50 cm/s  IVC diam: 1.70 cm TAPSE (M-mode): 1.9 cm LEFT ATRIUM               Index        RIGHT ATRIUM           Index LA diam:        4.10 cm  1.90 cm/m   RA Area:     19.40 cm LA Vol (A2C):   103.0 ml 47.85 ml/m  RA Volume:   51.70 ml  24.02 ml/m LA Vol (A4C):   79.8 ml  37.07 ml/m LA Biplane Vol: 92.5 ml  42.97 ml/m  AORTIC VALVE LVOT Vmax:   138.00 cm/s LVOT Vmean:  95.500 cm/s LVOT VTI:    0.301 m  AORTA Ao Root diam: 4.10 cm Ao Asc diam:  4.30 cm MITRAL VALVE               TRICUSPID VALVE MV Area (PHT): 2.32 cm    TR Peak grad:   13.8 mmHg MV Decel Time: 327 msec    TR Vmax:        186.00 cm/s MV E velocity: 67.70 cm/s MV A velocity: 77.60 cm/s  SHUNTS MV E/A ratio:  0.87        Systemic VTI:  0.30 m                            Systemic Diam: 2.10 cm Gwyndolyn Kaufman MD Electronically signed by Gwyndolyn Kaufman MD Signature Date/Time: 07/28/2021/1:49:11 PM    Final    CT Angio Chest/Abd/Pel for Dissection W and/or W/WO  Result Date: 07/27/2021 CLINICAL DATA:  Left chest pain EXAM: CT ANGIOGRAPHY CHEST, ABDOMEN AND PELVIS TECHNIQUE: Non-contrast CT of the chest was initially obtained. Multidetector CT imaging through the chest, abdomen and pelvis was performed using the standard protocol during bolus administration of intravenous contrast. Multiplanar reconstructed images and MIPs were obtained and reviewed to evaluate the vascular anatomy. CONTRAST:  150mL OMNIPAQUE IOHEXOL 350 MG/ML SOLN COMPARISON:  02/16/2021 FINDINGS: CTA CHEST FINDINGS Cardiovascular: There is no demonstrable mural hematoma in thoracic aorta. There is homogeneous enhancement in the thoracic aorta. There is no demonstrable intimal flap. Major branches of thoracic aorta appear patent. There are no filling defects in the central pulmonary artery branches. There are scattered coronary artery calcifications. Mediastinum/Nodes: No significant lymphadenopathy seen in mediastinum. There is 1.7 cm low-density nodule in the left lobe of thyroid. There are other smaller nodules in both lobes.  Lungs/Pleura: There is no focal pulmonary consolidation. There is no pleural effusion or pneumothorax. Musculoskeletal: Unremarkable. Review of the MIP images confirms the above findings. CTA ABDOMEN AND PELVIS FINDINGS VASCULAR Aorta: There is mild ectasia of abdominal aorta at the level of renal arteries measuring 3.3 cm in diameter. There is no demonstrable intimal flap. Celiac: Unremarkable. SMA: Unremarkable. Renals: No significant stenosis. IMA: Patent Inflow: Unremarkable Veins: Major venous structures are unremarkable. Review of the MIP images confirms the above findings. NON-VASCULAR Hepatobiliary: Liver measures 15.8 cm in length. There is 10 mm low-density in the right lobe in image 92 of series 6, possibly a cyst.  There is possible 8 mm cyst in the liver in image 82. there is no dilation of bile ducts. Gallbladder stones are seen. Pancreas: No focal abnormality is seen. Spleen: Spleen is not enlarged. Adrenals/Urinary Tract: There is hyperplasia of adrenals. There is no hydronephrosis. There is 3.8 cm cyst in the upper pole. In image 27, there is possible 10 mm cyst in the medial upper pole of left kidney. In the image 108 of series 6, there is 1.8 cm smooth marginated lesion with density measurements higher than usual for simple cyst in the posterolateral aspect of left kidney. In the image 116 of series 6, there is 8 mm smooth marginated exophytic lesion in the lower pole of right kidney with density measurements higher than usual for simple cyst. There are no renal or ureteral stones. Urinary bladder is not distended. Stomach/Bowel: Stomach is not distended. Small bowel loops are not dilated. Appendix is not dilated. Scattered diverticula are seen in the colon without signs of focal acute diverticulitis. Lymphatic: No significant lymphadenopathy seen. Reproductive: Unremarkable. Other: There is no ascites or pneumoperitoneum. Musculoskeletal: There is first-degree anterolisthesis at L5-S1 level. There  is encroachment of neural foramina at L4-L5 and L5-S1 levels. Review of the MIP images confirms the above findings. IMPRESSION: There is no evidence of dissection in the thoracic aorta. There are scattered coronary artery calcifications. There are no filling defects in the central pulmonary artery branches. There is ectasia of abdominal aorta at the level of renal arteries measuring 3.3 cm in diameter. There is no demonstrable dissection in the abdominal aorta. Major branches of the thoracic and abdominal aorta are patent. There are nodular densities in the both lobes of thyroid largest measuring 1.7 cm in size in the left lobe. Thyroid sonogram in outpatient setting should be considered for further characterization. There is no focal pulmonary consolidation. There is no pleural effusion or pneumothorax. There is no evidence of intestinal obstruction or pneumoperitoneum. There is no hydronephrosis. Appendix is not dilated. Gallbladder stones. There small low-density foci in the liver, possibly cysts. There is no dilation of bile ducts. Scattered coronary artery calcifications are seen. Diverticulosis of colon without signs of focal diverticulitis. Lumbar spondylosis. Bilateral renal cysts. There are few low-density lesions in both kidneys with density measurements higher than usual for simple cysts suggesting hemorrhagic cysts or solid lesions. When the patient's clinical condition permits follow-up renal sonogram may be considered. Electronically Signed   By: Elmer Picker M.D.   On: 07/27/2021 10:11   US Abdomen Limited RUQ (LIVER/GB)  Result Date: 07/27/2021 CLINICAL DATA:  Abdominal pain EXAM: ULTRASOUND ABDOMEN LIMITED RIGHT UPPER QUADRANT COMPARISON:  CT done earlier today FINDINGS: Gallbladder: There are small echogenic foci each measuring less than 6 mm in size in the lumen of gallbladder. There is no definite acoustic shadowing. Technologist did not observe any tenderness over the gallbladder.  There is no fluid around the gallbladder. Common bile duct: Diameter: 6 mm Liver: No focal lesion identified. Within normal limits in parenchymal echogenicity. Small low-density foci seen in the CT could not be distinctly identified in the submitted images of the liver. Portal vein is patent on color Doppler imaging with normal direction of blood flow towards the liver. Other: None. IMPRESSION: There are small echogenic foci in the lumen of gallbladder suggesting gallbladder stones. There are no imaging signs of acute cholecystitis. Common bile duct measures 6 mm without dilation of intrahepatic bile ducts. Electronically Signed   By: Elmer Picker M.D.   On: 07/27/2021  11:15    Echocardiogram  Subjective: No chest pain or shortness of breath, hoping to go home.   Discharge Exam: Vitals:   07/28/21 0809 07/28/21 1300  BP: 115/72   Pulse: 81 67  Resp:  17  Temp:  98.4 F (36.9 C)  SpO2:     General: Pt is alert, awake, not in acute distress Cardiovascular: RRR, soft systolic murmur at base. S1/S2 +, no rubs, no gallops Respiratory: CTA bilaterally, no wheezing, no rhonchi Abdominal: Soft, NT, ND, bowel sounds + Extremities: No edema, no cyanosis  Labs: BNP (last 3 results) No results for input(s): BNP in the last 8760 hours. Basic Metabolic Panel: Recent Labs  Lab 07/27/21 0810 07/27/21 1712 07/28/21 0129  NA 140  --  137  K 3.6  --  3.1*  CL 105  --  103  CO2 26  --  26  GLUCOSE 94  --  83  BUN 16  --  14  CREATININE 1.12  --  1.17  CALCIUM 9.6  --  9.0  MG  --  2.0  --    Liver Function Tests: Recent Labs  Lab 07/27/21 0810  AST 19  ALT 15  ALKPHOS 75  BILITOT 0.6  PROT 7.4  ALBUMIN 4.1   No results for input(s): LIPASE, AMYLASE in the last 168 hours. No results for input(s): AMMONIA in the last 168 hours. CBC: Recent Labs  Lab 07/27/21 0810  WBC 5.6  HGB 13.9  HCT 40.9  MCV 90.7  PLT 208   Cardiac Enzymes: No results for input(s): CKTOTAL,  CKMB, CKMBINDEX, TROPONINI in the last 168 hours. BNP: Invalid input(s): POCBNP CBG: No results for input(s): GLUCAP in the last 168 hours. D-Dimer No results for input(s): DDIMER in the last 72 hours. Hgb A1c No results for input(s): HGBA1C in the last 72 hours. Lipid Profile No results for input(s): CHOL, HDL, LDLCALC, TRIG, CHOLHDL, LDLDIRECT in the last 72 hours. Thyroid function studies No results for input(s): TSH, T4TOTAL, T3FREE, THYROIDAB in the last 72 hours.  Invalid input(s): FREET3 Anemia work up No results for input(s): VITAMINB12, FOLATE, FERRITIN, TIBC, IRON, RETICCTPCT in the last 72 hours. Urinalysis    Component Value Date/Time   COLORURINE YELLOW 07/27/2021 1706   APPEARANCEUR CLEAR 07/27/2021 1706   LABSPEC 1.010 07/27/2021 1706   PHURINE 7.0 07/27/2021 1706   GLUCOSEU NEGATIVE 07/27/2021 1706   HGBUR NEGATIVE 07/27/2021 1706   BILIRUBINUR NEGATIVE 07/27/2021 1706   BILIRUBINUR negative 05/10/2021 0940   KETONESUR NEGATIVE 07/27/2021 1706   PROTEINUR NEGATIVE 07/27/2021 1706   UROBILINOGEN 0.2 05/10/2021 0940   NITRITE NEGATIVE 07/27/2021 1706   LEUKOCYTESUR NEGATIVE 07/27/2021 1706    Microbiology Recent Results (from the past 240 hour(s))  Resp Panel by RT-PCR (Flu A&B, Covid) Nasopharyngeal Swab     Status: None   Collection Time: 07/27/21 12:15 PM   Specimen: Nasopharyngeal Swab; Nasopharyngeal(NP) swabs in vial transport medium  Result Value Ref Range Status   SARS Coronavirus 2 by RT PCR NEGATIVE NEGATIVE Final    Comment: (NOTE) SARS-CoV-2 target nucleic acids are NOT DETECTED.  The SARS-CoV-2 RNA is generally detectable in upper respiratory specimens during the acute phase of infection. The lowest concentration of SARS-CoV-2 viral copies this assay can detect is 138 copies/mL. A negative result does not preclude SARS-Cov-2 infection and should not be used as the sole basis for treatment or other patient management decisions. A negative  result may occur with  improper specimen collection/handling, submission  of specimen other than nasopharyngeal swab, presence of viral mutation(s) within the areas targeted by this assay, and inadequate number of viral copies(<138 copies/mL). A negative result must be combined with clinical observations, patient history, and epidemiological information. The expected result is Negative.  Fact Sheet for Patients:  EntrepreneurPulse.com.au  Fact Sheet for Healthcare Providers:  IncredibleEmployment.be  This test is no t yet approved or cleared by the [place] FDA and  has been authorized for detection and/or diagnosis of SARS-CoV-2 by FDA under an Emergency Use Authorization (EUA). This EUA will remain  in effect (meaning this test can be used) for the duration of the COVID-19 declaration under Section 564(b)(1) of the Act, 21 U.S.C.section 360bbb-3(b)(1), unless the authorization is terminated  or revoked sooner.       Influenza A by PCR NEGATIVE NEGATIVE Final   Influenza B by PCR NEGATIVE NEGATIVE Final    Comment: (NOTE) The Xpert Xpress SARS-CoV-2/FLU/RSV plus assay is intended as an aid in the diagnosis of influenza from Nasopharyngeal swab specimens and should not be used as a sole basis for treatment. Nasal washings and aspirates are unacceptable for Xpert Xpress SARS-CoV-2/FLU/RSV testing.  Fact Sheet for Patients: EntrepreneurPulse.com.au  Fact Sheet for Healthcare Providers: IncredibleEmployment.be  This test is not yet approved or cleared by the [place] FDA and has been authorized for detection and/or diagnosis of SARS-CoV-2 by FDA under an Emergency Use Authorization (EUA). This EUA will remain in effect (meaning this test can be used) for the duration of the COVID-19 declaration under Section 564(b)(1) of the Act, 21 U.S.C. section 360bbb-3(b)(1), unless the authorization is  terminated or revoked.  Performed at [facility], [address] [place], [place] [address]     Time coordinating discharge: Approximately 40 minutes  Patrecia Pour, MD  [facility] 07/28/2021, 3:07 PM

## 2021-07-28 NOTE — Progress Notes (Signed)
Explained discharge summary with patient. Reviewed new discharge medication, was given and what is due later today. Removed left FA PIV. Catheter was intact. Transported downstairs for discharge without any issues.

## 2021-07-28 NOTE — Progress Notes (Signed)
  Echocardiogram 2D Echocardiogram has been performed.  Delcie Roch 07/28/2021, 11:39 AM

## 2021-07-28 NOTE — TOC Benefit Eligibility Note (Signed)
Patient Product/process development scientist completed.    The patient is currently admitted and upon discharge could be taking Mitigare 0.6 mg Capsules.  The current 30 day co-pay is, $40.00.   The patient is insured through [facility] Medicare Part D     Richard Sampson, CPhT Pharmacy Patient Advocate Specialist [facility] Patient Advocate Team Direct Number: [phone]  Fax: [phone]

## 2021-07-28 NOTE — Plan of Care (Signed)
  Problem: Education: Goal: Knowledge of General Education information will improve Description: Including pain rating scale, medication(s)/side effects and non-pharmacologic comfort measures Outcome: Progressing   Problem: Health Behavior/Discharge Planning: Goal: Ability to manage health-related needs will improve Outcome: Progressing   Problem: Clinical Measurements: Goal: Ability to maintain clinical measurements within normal limits will improve Outcome: Progressing   Problem: Clinical Measurements: Goal: Cardiovascular complication will be avoided Outcome: Progressing   Problem: Safety: Goal: Ability to remain free from injury will improve Outcome: Progressing   Problem: Skin Integrity: Goal: Risk for impaired skin integrity will decrease Outcome: Progressing

## 2021-08-09 DIAGNOSIS — Z125 Encounter for screening for malignant neoplasm of prostate: Secondary | ICD-10-CM | POA: Diagnosis not present

## 2021-10-26 ENCOUNTER — Other Ambulatory Visit: Payer: Self-pay

## 2021-10-26 ENCOUNTER — Encounter: Payer: Self-pay | Admitting: Internal Medicine

## 2021-10-26 ENCOUNTER — Ambulatory Visit (INDEPENDENT_AMBULATORY_CARE_PROVIDER_SITE_OTHER): Payer: Medicare PPO

## 2021-10-26 ENCOUNTER — Ambulatory Visit (INDEPENDENT_AMBULATORY_CARE_PROVIDER_SITE_OTHER): Payer: Medicare PPO | Admitting: Internal Medicine

## 2021-10-26 VITALS — BP 118/64 | HR 78 | Temp 98.0°F | Ht 68.4 in | Wt 219.8 lb

## 2021-10-26 DIAGNOSIS — I119 Hypertensive heart disease without heart failure: Secondary | ICD-10-CM | POA: Diagnosis not present

## 2021-10-26 DIAGNOSIS — E6609 Other obesity due to excess calories: Secondary | ICD-10-CM

## 2021-10-26 DIAGNOSIS — Z0000 Encounter for general adult medical examination without abnormal findings: Secondary | ICD-10-CM | POA: Diagnosis not present

## 2021-10-26 DIAGNOSIS — I700 Atherosclerosis of aorta: Secondary | ICD-10-CM | POA: Diagnosis not present

## 2021-10-26 DIAGNOSIS — I480 Paroxysmal atrial fibrillation: Secondary | ICD-10-CM

## 2021-10-26 DIAGNOSIS — E876 Hypokalemia: Secondary | ICD-10-CM

## 2021-10-26 DIAGNOSIS — F17210 Nicotine dependence, cigarettes, uncomplicated: Secondary | ICD-10-CM

## 2021-10-26 DIAGNOSIS — D6869 Other thrombophilia: Secondary | ICD-10-CM

## 2021-10-26 DIAGNOSIS — I2510 Atherosclerotic heart disease of native coronary artery without angina pectoris: Secondary | ICD-10-CM | POA: Diagnosis not present

## 2021-10-26 DIAGNOSIS — E7800 Pure hypercholesterolemia, unspecified: Secondary | ICD-10-CM

## 2021-10-26 DIAGNOSIS — Z6833 Body mass index (BMI) 33.0-33.9, adult: Secondary | ICD-10-CM

## 2021-10-26 NOTE — Progress Notes (Signed)
[name],acting as a Education administrator for [name], MD.,have documented all relevant documentation on the behalf of [name], MD,as directed by  [name], MD while in the presence of [name], MD.  This visit occurred during the SARS-CoV-2 public health emergency.  Safety protocols were in place, including screening questions prior to the visit, additional usage of staff PPE, and extensive cleaning of exam room while observing appropriate contact time as indicated for disinfecting solutions.  Subjective:     Patient ID: Richard Sampson , male    DOB: May 09, 1946 , 76 y.o.   MRN: [id number]   Chief Complaint  Patient presents with   Hypertension   Hyperlipidemia    HPI  He is here today for BP and Chol check. He reports compliance with meds.  He denies having any headaches, chest pain and shortness of breath.   He was also seen by [place] for AWV.   Hypertension This is a chronic problem. The current episode started more than 1 year ago. The problem has been gradually improving since onset. The problem is controlled. Pertinent negatives include no blurred vision, chest pain, palpitations or shortness of breath. The current treatment provides moderate improvement. Compliance problems include exercise.   Hyperlipidemia This is a chronic problem. The current episode started more than 1 year ago. The problem is controlled. Recent lipid tests were reviewed and are normal. Exacerbating diseases include obesity. He has no history of diabetes or hypothyroidism. Pertinent negatives include no chest pain or shortness of breath. Current antihyperlipidemic treatment includes statins. There are no compliance problems.     Past Medical History:  Diagnosis Date   Agatston coronary artery calcium score between 100 and 199    a. 12/2019 Cardiac CT: 3 vessel cor Ca2+. Ca2+ = 144 (66th %i'le); b. 12/2019 MV: EF 60%, No ischemia/infarct. Ex time 7:30, Max HR 139.   Heart  murmur    a. 09/2015 Echo: EF 55-60%, no rwma, GrI DD, mildly dil LA/RA.   High cholesterol    History of GIB (gastrointestinal bleeding)    a. Angiodysplasia of intestine several years ago.   Hypertension    PAF (paroxysmal atrial fibrillation) ([place]) 07/17/2014   a. CHA2DS2VASc = 3-->eliquis.   Pneumonia    Thoracic ascending aortic aneurysm    a. 09/2020 CT Chest: Stable aneurysmal dilatation of the ascending thoracic aorta measuring 4.3 cm.     Family History  Problem Relation Age of Onset   Thyroid nodules Mother    Dementia Mother    Heart failure Father    Cancer Father    Kidney disease Son    Diabetes Brother    Cancer Brother    Heart attack Neg Hx    Stroke Neg Hx      Current Outpatient Medications:    acetaminophen (TYLENOL) 325 MG tablet, Take 650 mg by mouth every 6 (six) hours as needed for moderate pain or mild pain. (Patient not taking: Reported on 10/26/2021), Disp: , Rfl:    amLODipine (NORVASC) 10 MG tablet, Take 10 mg by mouth daily., Disp: , Rfl:    apixaban (ELIQUIS) 5 MG TABS tablet, Take 1 tablet by mouth 2 times a day. Need to schedule appt with cardiologist for refills., Disp: 60 tablet, Rfl: 0   atorvastatin (LIPITOR) 80 MG tablet, Take 1 tablet (80 mg total) by mouth daily. (Patient taking differently: Take 80 mg by mouth daily. Pt states 49m daily), Disp: 90 tablet,  Rfl: 3   calcium carbonate (TUMS EX) 750 MG chewable tablet, Chew 2 tablets by mouth daily as needed for heartburn., Disp: , Rfl:    cholecalciferol (VITAMIN D3) 10 MCG (400 UNIT) TABS tablet, Take 400 Units by mouth., Disp: , Rfl:    ferrous sulfate 325 (65 FE) MG tablet, Take 325 mg by mouth daily with breakfast., Disp: , Rfl:    pantoprazole (PROTONIX) 40 MG tablet, Take 1 tablet (40 mg total) by mouth daily at 6 (six) AM., Disp: 30 tablet, Rfl: 0   potassium chloride SA (K-DUR,KLOR-CON) 20 MEQ tablet, Take 40 mEq by mouth 2 (two) times daily. Morning and evening, Disp: , Rfl:     triamterene-hydrochlorothiazide (MAXZIDE) 75-50 MG per tablet, Take 0.5 tablets by mouth every evening. , Disp: , Rfl:    Allergies  Allergen Reactions   Ace Inhibitors Swelling    Throat swelling; pt was on lisinopril   Angiotensin Receptor Blockers Swelling    Cross reactivity in known ACE I allergy   Lisinopril Swelling    Throat swelling   Losartan Other (See Comments)    Doctor told pt not to take     Review of Systems  Constitutional: Negative.   Eyes:  Negative for blurred vision.  Respiratory: Negative.  Negative for shortness of breath.   Cardiovascular: Negative.  Negative for chest pain and palpitations.  Gastrointestinal: Negative.   Neurological: Negative.   Psychiatric/Behavioral: Negative.      Today's Vitals   10/26/21 0825  BP: 118/64  Pulse: 78  Temp: 98 F (36.7 C)  Weight: 219 lb 12.8 oz (99.7 kg)  Height: 5' 8.4" (1.737 m)   Body mass index is 33.03 kg/m.  Wt Readings from Last 3 Encounters:  10/26/21 219 lb 12.8 oz (99.7 kg)  10/26/21 219 lb 12.8 oz (99.7 kg)  07/27/21 215 lb (97.5 kg)    Objective:  Physical Exam Vitals and nursing note reviewed.  Constitutional:      Appearance: Normal appearance. He is obese.  HENT:     Head: Normocephalic and atraumatic.     Nose:     Comments: Masked     Mouth/Throat:     Comments: Masked  Eyes:     Extraocular Movements: Extraocular movements intact.  Cardiovascular:     Rate and Rhythm: Normal rate and regular rhythm.     Heart sounds: Normal heart sounds.  Pulmonary:     Effort: Pulmonary effort is normal.     Breath sounds: Normal breath sounds.  Musculoskeletal:     Cervical back: Normal range of motion.  Skin:    General: Skin is warm.  Neurological:     General: No focal deficit present.     Mental Status: He is alert.  Psychiatric:        Mood and Affect: Mood normal.     Assessment And Plan:     1. Hypertensive heart disease without heart failure Comments: Chronic, well  controlled. No med changes. He is encouraged to follow low sodium diet.  He will f/u in 6 months for CPE.  - CMP14+EGFR - CBC no Diff  2. Pure hypercholesterolemia Comments: Chronic, currently on atorvastatin 21m. Importance of compliance was d/w the patient.  - Lipid panel  3. Paroxysmal atrial fibrillation (HCC) Comments: Chronic, he is in sinus rhythm today. He is properly anticoagulated and he is rate controlled.   4. Atherosclerosis of aorta (HCC) Comments: Chronic, he is anticoagulated w/ Eliquis and on statin therapy. Importance  of medication/dietary compliance was d/w patient .  5. Atherosclerosis of native coronary artery of native heart without angina pectoris Comments: Chronic, encouraged to follow heart healthy lifestyle. He is also encouraged to c/w statin therapy and anticoagulant.   6. Hypokalemia Comments: Pt had low potassium in Dec 2022. I will recheck a BMP today.  He is encouraged to increase his intake of potassium rich foods.   7. 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 He was congratulated on his progress and encouraged to further decrease number of cigs smoked/day. He is aware low dose CT due   8. Acquired thrombophilia ([place]) Comments: He is on Eliquis for PAF. I will recheck CBC today.   9. Class 1 obesity due to excess calories with serious comorbidity and body mass index (BMI) of 33.0 to 33.9 in adult He is encouraged to strive for BMI less than 30 to decrease cardiac risk. He is advised to aim for at least 150 minutes of exercise per week.    Patient was given opportunity to ask questions. Patient verbalized understanding of the plan and was able to repeat key elements of the plan. All questions were answered to their satisfaction.   I, [name], MD, have reviewed all documentation for this visit. The documentation on 10/26/21 for the exam, diagnosis, procedures, and orders are all accurate and  complete.   IF YOU HAVE BEEN REFERRED TO A SPECIALIST, IT MAY TAKE 1-2 WEEKS TO SCHEDULE/PROCESS THE REFERRAL. IF YOU HAVE NOT HEARD FROM US/SPECIALIST IN TWO WEEKS, PLEASE GIVE [place] A CALL AT [phone] X [phone].   THE PATIENT IS ENCOURAGED TO PRACTICE SOCIAL DISTANCING DUE TO THE COVID-19 PANDEMIC.

## 2021-10-26 NOTE — Progress Notes (Signed)
This visit occurred during the SARS-CoV-2 public health emergency.  Safety protocols were in place, including screening questions prior to the visit, additional usage of staff PPE, and extensive cleaning of exam room while observing appropriate contact time as indicated for disinfecting solutions.  Subjective:   Richard Sampson is a 76 y.o. male who presents for Medicare Annual/Subsequent preventive examination.  Review of Systems     Cardiac Risk Factors include: advanced age (>11men, >57 women);dyslipidemia;hypertension;male gender;obesity (BMI >30kg/m2)     Objective:    Today's Vitals   10/26/21 0822 10/26/21 0823  BP: 118/64   Pulse: 78   Temp: 98 F (36.7 C)   TempSrc: Oral   SpO2: 95%   Weight: 219 lb 12.8 oz (99.7 kg)   Height: 5' 8.4" (1.737 m)   PainSc:  1    Body mass index is 33.03 kg/m.  Advanced Directives 10/26/2021 07/27/2021 07/27/2021 04/19/2021 10/20/2020 09/21/2020 01/28/2020  Does Patient Have a Medical Advance Directive? Yes Yes No No Yes No Yes  Type of Paramedic of [place];Living will Living will - - [place];Living will - [place];Living will  Does patient want to make changes to medical advance directive? - No - Patient declined - - - - -  Copy of [place] in Chart? Yes - validated most recent copy scanned in chart (See row information) - - - Yes - validated most recent copy scanned in chart (See row information) - Yes - validated most recent copy scanned in chart (See row information)  Would patient like information on creating a medical advance directive? - No - Patient declined No - Patient declined - - No - Patient declined -  Pre-existing out of facility DNR order (yellow form or pink MOST form) - - - - - - -    Current Medications (verified) Outpatient Encounter Medications as of 10/26/2021  Medication Sig   amLODipine (NORVASC) 10 MG tablet Take 10 mg by mouth daily.    apixaban (ELIQUIS) 5 MG TABS tablet Take 1 tablet by mouth 2 times a day. Need to schedule appt with cardiologist for refills.   atorvastatin (LIPITOR) 80 MG tablet Take 1 tablet (80 mg total) by mouth daily. (Patient taking differently: Take 80 mg by mouth daily. Pt states 40mg  daily)   calcium carbonate (TUMS EX) 750 MG chewable tablet Chew 2 tablets by mouth daily as needed for heartburn.   cholecalciferol (VITAMIN D3) 10 MCG (400 UNIT) TABS tablet Take 400 Units by mouth.   ferrous sulfate 325 (65 FE) MG tablet Take 325 mg by mouth daily with breakfast.   pantoprazole (PROTONIX) 40 MG tablet Take 1 tablet (40 mg total) by mouth daily at 6 (six) AM.   potassium chloride SA (K-DUR,KLOR-CON) 20 MEQ tablet Take 40 mEq by mouth 2 (two) times daily. Morning and evening   triamterene-hydrochlorothiazide (MAXZIDE) 75-50 MG per tablet Take 0.5 tablets by mouth every evening.    acetaminophen (TYLENOL) 325 MG tablet Take 650 mg by mouth every 6 (six) hours as needed for moderate pain or mild pain. (Patient not taking: Reported on 10/26/2021)   No facility-administered encounter medications on file as of 10/26/2021.    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 12/2019 Cardiac CT: 3 vessel cor Ca2+. Ca2+ = 144 (66th %i'le);  b. 12/2019 MV: EF 60%, No ischemia/infarct. Ex time 7:30, Max HR 139.   Heart murmur    a. 09/2015 Echo: EF 55-60%, no rwma, GrI DD, mildly dil LA/RA.   High cholesterol    History of GIB (gastrointestinal bleeding)    a. Angiodysplasia of intestine several years ago.   Hypertension    PAF (paroxysmal atrial fibrillation) ([place]) 07/17/2014   a. CHA2DS2VASc = 3-->eliquis.   Pneumonia    Thoracic ascending aortic aneurysm    a. 09/2020 CT Chest: Stable aneurysmal dilatation of the ascending thoracic aorta measuring 4.3  cm.   Past Surgical History:  Procedure Laterality Date   left wrist surgery Left 1966   glass cut ligaments, surgical repair   [place]   Family History  Problem Relation Age of Onset   Thyroid nodules Mother    Dementia Mother    Heart failure Father    Cancer Father    Kidney disease Son    Diabetes Brother    Cancer Brother    Heart attack Neg Hx    Stroke Neg Hx    Social History   Socioeconomic History   Marital status: Married    Spouse name: Not on file   Number of children: Not on file   Years of education: Not on file   Highest education level: Not on file  Occupational History   Occupation: retired  Tobacco Use   Smoking status: Every Day    Packs/day: 1.00    Years: 20.00    Pack years: 20.00    Types: Cigars, Cigarettes    Start date: 1965    Last attempt to quit: 08/18/2018    Years since quitting: 3.1   Smokeless tobacco: Never   Tobacco comments:    Started at age 63 - 1ppdx 67, quit x 5; pipex5 years, cigarsx15  Vaping Use   Vaping Use: Never used  Substance and Sexual Activity   Alcohol use: Not Currently    Comment: occasionally   Drug use: Not Currently   Sexual activity: Yes  Other Topics Concern   Not on file  Social History Narrative   Lives locally with wife.  2 grown children.  Retired Data processing manager. Exercises regularly @ [employer].   Social Determinants of Health   Financial Resource Strain: Low Risk    Difficulty of Paying Living Expenses: Not hard at all  Food Insecurity: No Food Insecurity   Worried About Charity fundraiser in the Last Year: Never true   [place] in the Last Year: Never true  Transportation Needs: No Transportation Needs   Lack of Transportation (Medical): No   Lack of Transportation (Non-Medical): No  Physical Activity: Sufficiently Active   Days of Exercise per Week: 3 days   Minutes of Exercise per Session: 50 min  Stress: No Stress Concern  Present   Feeling of Stress : Not at all  Social Connections: Not on file    Tobacco Counseling Ready to quit: Not Answered Counseling given: Not Answered Tobacco comments: Started at age 89 - 1ppdx 64, quit x 5; pipex5 years, cigarsx15   Clinical Intake:  Pre-visit preparation completed: Yes  Pain : 0-10 Pain Score: 1  Pain Type: Chronic pain Pain Location: Generalized Pain Descriptors / Indicators: Aching Pain Onset: More than a month ago Pain Frequency: Constant     Nutritional Status: BMI > 30  Obese Nutritional Risks: None Diabetes:  No  How often do you need to have someone help you when you read instructions, pamphlets, or other written materials from your doctor or pharmacy?: 1 - Never  Diabetic? no  Interpreter Needed?: No  Information entered by :: NAllen LPN   Activities of Daily Living In your present state of health, do you have any difficulty performing the following activities: 10/26/2021 07/27/2021  Hearing? N N  Comment - wear glasses  Vision? N N  Difficulty concentrating or making decisions? N N  Walking or climbing stairs? N N  Dressing or bathing? N N  Doing errands, shopping? N N  Preparing Food and eating ? N -  Using the Toilet? N -  In the past six months, have you accidently leaked urine? N -  Do you have problems with loss of bowel control? N -  Managing your Medications? N -  Managing your Finances? N -  Housekeeping or managing your Housekeeping? N -  Some recent data might be hidden    Patient Care Team: Glendale Chard, MD as PCP - General (Internal Medicine) Sueanne Margarita, MD as PCP - Cardiology (Cardiology)  Indicate any recent Medical Services you may have received from other than [facility] providers in the past year (date may be approximate).     Assessment:   This is a routine wellness examination for [place].  Hearing/Vision screen No results found.  Dietary issues and exercise activities discussed: Current Exercise  Habits: Home exercise routine, Type of exercise: Other - see comments (biking), Time (Minutes): 45, Frequency (Times/Week): 3, Weekly Exercise (Minutes/Week): 135   Goals Addressed             This Visit's Progress    Patient Stated       10/26/2021, no goals       Depression Screen PHQ 2/9 Scores 10/26/2021 10/20/2020 01/28/2020 05/01/2019 01/16/2019 10/10/2018 09/26/2018  PHQ - 2 Score 0 0 0 0 0 0 0  PHQ- 9 Score - - 0 - 0 - -    Fall Risk Fall Risk  10/26/2021 10/20/2020 01/28/2020 05/01/2019 01/16/2019  Falls in the past year? 0 0 1 0 0  Comment - - lost balance - -  Number falls in past yr: - - 0 - -  Injury with Fall? - - 0 - -  Risk for fall due to : Medication side effect Medication side effect Medication side effect - Medication side effect  Follow up Falls evaluation completed;Education provided;Falls prevention discussed Falls evaluation completed;Education provided;Falls prevention discussed Falls evaluation completed;Education provided;Falls prevention discussed - Falls prevention discussed    FALL RISK PREVENTION PERTAINING TO THE HOME:  Any stairs in or around the home? No  If so, are there any without handrails? N/a Home free of loose throw rugs in walkways, pet beds, electrical cords, etc? Yes  Adequate lighting in your home to reduce risk of falls? Yes   ASSISTIVE DEVICES UTILIZED TO PREVENT FALLS:  Life alert? No  Use of a cane, walker or w/c? No  Grab bars in the bathroom? Yes  Shower chair or bench in shower? Yes  Elevated toilet seat or a handicapped toilet? No   TIMED UP AND GO:  Was the test performed? No .     Gait steady and fast without use of assistive device  Cognitive Function:     6CIT Screen 10/26/2021 10/20/2020 01/28/2020 01/16/2019  What Year? 0 points 0 points 0 points 0 points  What month? 0 points 0 points 0  points 0 points  What time? 0 points 0 points 0 points 3 points  Count back from 20 0 points 0 points 0 points 0 points  Months in reverse 0  points 0 points 0 points 0 points  Repeat phrase 2 points 4 points 0 points 0 points  Total Score 2 4 0 3    Immunizations Immunization History  Administered Date(s) Administered   Fluad Quad(high Dose 65+) 06/02/2020, 05/10/2021   Influenza, High Dose Seasonal PF 06/01/2016, 05/10/2017, 06/04/2018, 05/01/2019   Influenza, Seasonal, Injecte, Preservative Fre 05/08/2012, 06/18/2014   Influenza-Unspecified 07/07/2002, 07/12/2004, 07/22/2008, 09/20/2009, 05/17/2010, 05/11/2011, 05/22/2015, 06/21/2018, 04/22/2019   PFIZER Comirnaty(Gray Top)Covid-19 Tri-Sucrose Vaccine 12/15/2020   PFIZER(Purple Top)SARS-COV-2 Vaccination 09/21/2019, 10/12/2019, 05/28/2020   Pneumococcal Conjugate-13 11/07/2013, 05/22/2015   Pneumococcal Polysaccharide-23 01/01/2015, 01/31/2017   Pneumococcal-Unspecified 07/22/2008   Tdap 07/25/2010, 10/24/2011, 08/21/2012   Zoster Recombinat (Shingrix) 06/14/2018, 09/16/2018    TDAP status: Up to date  Flu Vaccine status: Up to date  Pneumococcal vaccine status: Up to date  Covid-19 vaccine status: Completed vaccines  Qualifies for Shingles Vaccine? Yes   Zostavax completed No   Shingrix Completed?: Yes  Screening Tests Health Maintenance  Topic Date Due   COLONOSCOPY (Pts 45-38yrs Insurance coverage will need to be confirmed)  04/30/2018   COVID-19 Vaccine (5 - Booster for Pfizer series) 02/09/2021   TETANUS/TDAP  08/21/2022   Pneumonia Vaccine 75+ Years old  Completed   INFLUENZA VACCINE  Completed   Hepatitis C Screening  Completed   Zoster Vaccines- Shingrix  Completed   HPV VACCINES  Aged Out    Health Maintenance  Health Maintenance Due  Topic Date Due   COLONOSCOPY (Pts 45-60yrs Insurance coverage will need to be confirmed)  04/30/2018   COVID-19 Vaccine (5 - Booster for Pfizer series) 02/09/2021    Colorectal cancer screening: requesting report  Lung Cancer Screening: (Low Dose CT Chest recommended if Age 74-80 years, 30 pack-year  currently smoking OR have quit w/in 15years.) does qualify.   Lung Cancer Screening Referral: CT scan 02/16/2021  Additional Screening:  Hepatitis C Screening: does qualify; Completed 09/02/2012  Vision Screening: Recommended annual ophthalmology exams for early detection of glaucoma and other disorders of the eye. Is the patient up to date with their annual eye exam?  Yes  Who is the provider or what is the name of the office in which the patient attends [facility] exams[facility] If pt is not established with a provider, would they like to be referred to a provider to establish care? No .   Dental Screening: Recommended annual dental exams for proper oral hygiene  Community Resource Referral / Chronic Care Management: CRR required this visit?  No   CCM required this visit?  No      Plan:     I have personally reviewed and noted the following in the patients chart:   Medical and social history Use of alcohol, tobacco or illicit drugs  Current medications and supplements including opioid prescriptions. Patient is not currently taking opioid prescriptions. Functional ability and status Nutritional status Physical activity Advanced directives List of other physicians Hospitalizations, surgeries, and ER visits in previous 12 months Vitals Screenings to include cognitive, depression, and falls Referrals and appointments  In addition, I have reviewed and discussed with patient certain preventive protocols, quality metrics, and best practice recommendations. A written personalized care plan for preventive services as well as general preventive health recommendations were provided to patient.     Marissa Calamity  Zenia Resides, LPN   [phone]   Nurse Notes: none

## 2021-10-26 NOTE — Patient Instructions (Signed)
Richard Sampson , Thank you for taking time to come for your Medicare Wellness Visit. I appreciate your ongoing commitment to your health goals. Please review the following plan we discussed and let me know if I can assist you in the future.   Screening recommendations/referrals: Colonoscopy: requesting report from [place] Recommended yearly ophthalmology/optometry visit for glaucoma screening and checkup Recommended yearly dental visit for hygiene and checkup  Vaccinations: Influenza vaccine: completed 05/10/2021, due next flu season Pneumococcal vaccine: completed 01/31/2017 Tdap vaccine: completed 08/21/2012, due 08/21/2022 Shingles vaccine: completed    Covid-19:  12/15/2020, 05/28/2020, 10/12/2019, 09/21/2019  Advanced directives: copy in chart  Conditions/risks identified: none  Next appointment: Follow up in one year for your annual wellness visit.   Preventive Care 33 Years and Older, Male Preventive care refers to lifestyle choices and visits with your health care provider that can promote health and wellness. What does preventive care include? A yearly physical exam. This is also called an annual well check. Dental exams once or twice a year. Routine eye exams. Ask your health care provider how often you should have your eyes checked. Personal lifestyle choices, including: Daily care of your teeth and gums. Regular physical activity. Eating a healthy diet. Avoiding tobacco and drug use. Limiting alcohol use. Practicing safe sex. Taking low doses of aspirin every day. Taking vitamin and mineral supplements as recommended by your health care provider. What happens during an annual well check? The services and screenings done by your health care provider during your annual well check will depend on your age, overall health, lifestyle risk factors, and family history of disease. Counseling  Your health care provider may ask you questions about your: Alcohol use. Tobacco use. Drug  use. Emotional well-being. Home and relationship well-being. Sexual activity. Eating habits. History of falls. Memory and ability to understand (cognition). Work and work Astronomer. Screening  You may have the following tests or measurements: Height, weight, and BMI. Blood pressure. Lipid and cholesterol levels. These may be checked every 5 years, or more frequently if you are over 43 years old. Skin check. Lung cancer screening. You may have this screening every year starting at age 31 if you have a 30-pack-year history of smoking and currently smoke or have quit within the past 15 years. Fecal occult blood test (FOBT) of the stool. You may have this test every year starting at age 57. Flexible sigmoidoscopy or colonoscopy. You may have a sigmoidoscopy every 5 years or a colonoscopy every 10 years starting at age 62. Prostate cancer screening. Recommendations will vary depending on your family history and other risks. Hepatitis C blood test. Hepatitis B blood test. Sexually transmitted disease (STD) testing. Diabetes screening. This is done by checking your blood sugar (glucose) after you have not eaten for a while (fasting). You may have this done every 1-3 years. Abdominal aortic aneurysm (AAA) screening. You may need this if you are a current or former smoker. Osteoporosis. You may be screened starting at age 8 if you are at high risk. Talk with your health care provider about your test results, treatment options, and if necessary, the need for more tests. Vaccines  Your health care provider may recommend certain vaccines, such as: Influenza vaccine. This is recommended every year. Tetanus, diphtheria, and acellular pertussis (Tdap, Td) vaccine. You may need a Td booster every 10 years. Zoster vaccine. You may need this after age 69. Pneumococcal 13-valent conjugate (PCV13) vaccine. One dose is recommended after age 30. Pneumococcal polysaccharide (PPSV23) vaccine.  One dose is  recommended after age 100. Talk to your health care provider about which screenings and vaccines you need and how often you need them. This information is not intended to replace advice given to you by your health care provider. Make sure you discuss any questions you have with your health care provider. Document Released: 09/03/2015 Document Revised: 04/26/2016 Document Reviewed: 06/08/2015 Elsevier Interactive Patient Education  2017 [employer] Prevention in the Home Falls can cause injuries. They can happen to people of all ages. There are many things you can do to make your home safe and to help prevent falls. What can I do on the outside of my home? Regularly fix the edges of walkways and driveways and fix any cracks. Remove anything that might make you trip as you walk through a door, such as a raised step or threshold. Trim any bushes or trees on the path to your home. Use bright outdoor lighting. Clear any walking paths of anything that might make someone trip, such as rocks or tools. Regularly check to see if handrails are loose or broken. Make sure that both sides of any steps have handrails. Any raised decks and porches should have guardrails on the edges. Have any leaves, snow, or ice cleared regularly. Use sand or salt on walking paths during winter. Clean up any spills in your garage right away. This includes oil or grease spills. What can I do in the bathroom? Use night lights. Install grab bars by the toilet and in the tub and shower. Do not use towel bars as grab bars. Use non-skid mats or decals in the tub or shower. If you need to sit down in the shower, use a plastic, non-slip stool. Keep the floor dry. Clean up any water that spills on the floor as soon as it happens. Remove soap buildup in the tub or shower regularly. Attach bath mats securely with double-sided non-slip rug tape. Do not have throw rugs and other things on the floor that can make you  trip. What can I do in the bedroom? Use night lights. Make sure that you have a light by your bed that is easy to reach. Do not use any sheets or blankets that are too big for your bed. They should not hang down onto the floor. Have a firm chair that has side arms. You can use this for support while you get dressed. Do not have throw rugs and other things on the floor that can make you trip. What can I do in the kitchen? Clean up any spills right away. Avoid walking on wet floors. Keep items that you use a lot in easy-to-reach places. If you need to reach something above you, use a strong step stool that has a grab bar. Keep electrical cords out of the way. Do not use floor polish or wax that makes floors slippery. If you must use wax, use non-skid floor wax. Do not have throw rugs and other things on the floor that can make you trip. What can I do with my stairs? Do not leave any items on the stairs. Make sure that there are handrails on both sides of the stairs and use them. Fix handrails that are broken or loose. Make sure that handrails are as long as the stairways. Check any carpeting to make sure that it is firmly attached to the stairs. Fix any carpet that is loose or worn. Avoid having throw rugs at the top or bottom of  the stairs. If you do have throw rugs, attach them to the floor with carpet tape. Make sure that you have a light switch at the top of the stairs and the bottom of the stairs. If you do not have them, ask someone to add them for you. What else can I do to help prevent falls? Wear shoes that: Do not have high heels. Have rubber bottoms. Are comfortable and fit you well. Are closed at the toe. Do not wear sandals. If you use a stepladder: Make sure that it is fully opened. Do not climb a closed stepladder. Make sure that both sides of the stepladder are locked into place. Ask someone to hold it for you, if possible. Clearly mark and make sure that you can  see: Any grab bars or handrails. First and last steps. Where the edge of each step is. Use tools that help you move around (mobility aids) if they are needed. These include: Canes. Walkers. Scooters. Crutches. Turn on the lights when you go into a dark area. Replace any light bulbs as soon as they burn out. Set up your furniture so you have a clear path. Avoid moving your furniture around. If any of your floors are uneven, fix them. If there are any pets around you, be aware of where they are. Review your medicines with your doctor. Some medicines can make you feel dizzy. This can increase your chance of falling. Ask your doctor what other things that you can do to help prevent falls. This information is not intended to replace advice given to you by your health care provider. Make sure you discuss any questions you have with your health care provider. Document Released: 06/03/2009 Document Revised: 01/13/2016 Document Reviewed: 09/11/2014 Elsevier Interactive Patient Education  2017 [employer].

## 2021-10-26 NOTE — Patient Instructions (Signed)

## 2021-10-27 LAB — CMP14+EGFR
ALT: 14 IU/L (ref 0–44)
AST: 17 IU/L (ref 0–40)
Albumin/Globulin Ratio: 1.6 (ref 1.2–2.2)
Albumin: 4.4 g/dL (ref 3.7–4.7)
Alkaline Phosphatase: 79 IU/L (ref 44–121)
BUN/Creatinine Ratio: 15 (ref 10–24)
BUN: 18 mg/dL (ref 8–27)
Bilirubin Total: 0.4 mg/dL (ref 0.0–1.2)
CO2: 24 mmol/L (ref 20–29)
Calcium: 9.7 mg/dL (ref 8.6–10.2)
Chloride: 106 mmol/L (ref 96–106)
Creatinine, Ser: 1.17 mg/dL (ref 0.76–1.27)
Globulin, Total: 2.7 g/dL (ref 1.5–4.5)
Glucose: 100 mg/dL — ABNORMAL HIGH (ref 70–99)
Potassium: 4 mmol/L (ref 3.5–5.2)
Sodium: 144 mmol/L (ref 134–144)
Total Protein: 7.1 g/dL (ref 6.0–8.5)
eGFR: 66 mL/min/{1.73_m2} (ref >59)

## 2021-10-27 LAB — CBC
Hematocrit: 37.6 % (ref 37.5–51.0)
Hemoglobin: 12.4 g/dL — ABNORMAL LOW (ref 13.0–17.7)
MCH: 30.2 pg (ref 26.6–33.0)
MCHC: 33 g/dL (ref 31.5–35.7)
MCV: 92 fL (ref 79–97)
Platelets: 247 10*3/uL (ref 150–450)
RBC: 4.11 x10E6/uL — ABNORMAL LOW (ref 4.14–5.80)
RDW: 13 % (ref 11.6–15.4)
WBC: 3.2 10*3/uL — ABNORMAL LOW (ref 3.4–10.8)

## 2021-10-27 LAB — LIPID PANEL
Chol/HDL Ratio: 2.7 ratio (ref 0.0–5.0)
Cholesterol, Total: 131 mg/dL (ref 100–199)
HDL: 48 mg/dL (ref >39)
LDL Chol Calc (NIH): 70 mg/dL (ref 0–99)
Triglycerides: 61 mg/dL (ref 0–149)
VLDL Cholesterol Cal: 13 mg/dL (ref 5–40)

## 2021-11-10 ENCOUNTER — Other Ambulatory Visit (INDEPENDENT_AMBULATORY_CARE_PROVIDER_SITE_OTHER): Payer: Medicare PPO

## 2021-11-10 DIAGNOSIS — D649 Anemia, unspecified: Secondary | ICD-10-CM

## 2021-11-10 LAB — HEMOCCULT GUIAC POC 1CARD (OFFICE)
Card #2 Fecal Occult Blod, POC: NEGATIVE
Card #3 Fecal Occult Blood, POC: POSITIVE
Fecal Occult Blood, POC: NEGATIVE

## 2022-04-07 ENCOUNTER — Encounter (HOSPITAL_BASED_OUTPATIENT_CLINIC_OR_DEPARTMENT_OTHER): Payer: Self-pay | Admitting: Emergency Medicine

## 2022-04-07 ENCOUNTER — Other Ambulatory Visit: Payer: Self-pay

## 2022-04-07 ENCOUNTER — Emergency Department (HOSPITAL_BASED_OUTPATIENT_CLINIC_OR_DEPARTMENT_OTHER): Payer: Medicare PPO

## 2022-04-07 ENCOUNTER — Emergency Department (HOSPITAL_BASED_OUTPATIENT_CLINIC_OR_DEPARTMENT_OTHER)
Admission: EM | Admit: 2022-04-07 | Discharge: 2022-04-07 | Disposition: A | Discharge status: Home / Self Care | Payer: Medicare PPO | Attending: Emergency Medicine | Admitting: Emergency Medicine

## 2022-04-07 DIAGNOSIS — R079 Chest pain, unspecified: Secondary | ICD-10-CM | POA: Diagnosis not present

## 2022-04-07 DIAGNOSIS — Z79899 Other long term (current) drug therapy: Secondary | ICD-10-CM | POA: Insufficient documentation

## 2022-04-07 DIAGNOSIS — F17210 Nicotine dependence, cigarettes, uncomplicated: Secondary | ICD-10-CM | POA: Insufficient documentation

## 2022-04-07 DIAGNOSIS — I7121 Aneurysm of the ascending aorta, without rupture: Secondary | ICD-10-CM | POA: Diagnosis not present

## 2022-04-07 DIAGNOSIS — Z7901 Long term (current) use of anticoagulants: Secondary | ICD-10-CM | POA: Diagnosis not present

## 2022-04-07 DIAGNOSIS — R0789 Other chest pain: Secondary | ICD-10-CM | POA: Diagnosis not present

## 2022-04-07 DIAGNOSIS — I10 Essential (primary) hypertension: Secondary | ICD-10-CM | POA: Insufficient documentation

## 2022-04-07 LAB — BASIC METABOLIC PANEL
Anion gap: 7 (ref 5–15)
BUN: 24 mg/dL — ABNORMAL HIGH (ref 8–23)
CO2: 27 mmol/L (ref 22–32)
Calcium: 9.5 mg/dL (ref 8.9–10.3)
Chloride: 106 mmol/L (ref 98–111)
Creatinine, Ser: 1.2 mg/dL (ref 0.61–1.24)
GFR, Estimated: >60 mL/min (ref >60)
Glucose, Bld: 100 mg/dL — ABNORMAL HIGH (ref 70–99)
Potassium: 3.1 mmol/L — ABNORMAL LOW (ref 3.5–5.1)
Sodium: 140 mmol/L (ref 135–145)

## 2022-04-07 LAB — TROPONIN I (HIGH SENSITIVITY)
Troponin I (High Sensitivity): 3 ng/L (ref <18)
Troponin I (High Sensitivity): 3 ng/L (ref <18)
Troponin I (High Sensitivity): 3 ng/L (ref <18)

## 2022-04-07 LAB — CBC WITH DIFFERENTIAL/PLATELET
Abs Immature Granulocytes: 0.01 10*3/uL (ref 0.00–0.07)
Basophils Absolute: 0 10*3/uL (ref 0.0–0.1)
Basophils Relative: 0 %
Eosinophils Absolute: 0.1 10*3/uL (ref 0.0–0.5)
Eosinophils Relative: 2 %
HCT: 36.3 % — ABNORMAL LOW (ref 39.0–52.0)
Hemoglobin: 12.2 g/dL — ABNORMAL LOW (ref 13.0–17.0)
Immature Granulocytes: 0 %
Lymphocytes Relative: 28 %
Lymphs Abs: 1.3 10*3/uL (ref 0.7–4.0)
MCH: 30.8 pg (ref 26.0–34.0)
MCHC: 33.6 g/dL (ref 30.0–36.0)
MCV: 91.7 fL (ref 80.0–100.0)
Monocytes Absolute: 0.5 10*3/uL (ref 0.1–1.0)
Monocytes Relative: 11 %
Neutro Abs: 2.7 10*3/uL (ref 1.7–7.7)
Neutrophils Relative %: 59 %
Platelets: 201 10*3/uL (ref 150–400)
RBC: 3.96 MIL/uL — ABNORMAL LOW (ref 4.22–5.81)
RDW: 14.2 % (ref 11.5–15.5)
WBC: 4.6 10*3/uL (ref 4.0–10.5)
nRBC: 0 % (ref 0.0–0.2)

## 2022-04-07 MED ORDER — IOHEXOL 350 MG/ML SOLN
75.0000 mL | Freq: Once | INTRAVENOUS | Status: AC | PRN
  Administered 2022-04-07: 75 mL via INTRAVENOUS

## 2022-04-07 MED ORDER — SUCRALFATE 1 GM/10ML PO SUSP
1.0000 g | Freq: Once | ORAL | Status: AC
  Administered 2022-04-07: 1 g via ORAL
  Filled 2022-04-07: qty 10

## 2022-04-07 MED ORDER — PANTOPRAZOLE SODIUM 40 MG IV SOLR
40.0000 mg | Freq: Once | INTRAVENOUS | Status: AC
Start: 2022-04-07 — End: 2022-04-07
  Administered 2022-04-07: 40 mg via INTRAVENOUS
  Filled 2022-04-07: qty 10

## 2022-04-07 MED ORDER — POTASSIUM CHLORIDE 20 MEQ PO PACK
40.0000 meq | PACK | Freq: Once | ORAL | Status: AC
  Administered 2022-04-07: 40 meq via ORAL
  Filled 2022-04-07: qty 2

## 2022-04-07 MED ORDER — PANTOPRAZOLE SODIUM 40 MG PO TBEC: 40.0000 mg | DELAYED_RELEASE_TABLET | Freq: Every day | ORAL | 0 refills | Status: DC

## 2022-04-07 NOTE — ED Provider Notes (Signed)
[facility] Provider Note: Richard Dell, MD, FACEP  CSN: [id number] MRN: [id number] ARRIVAL: 04/07/22 at 0402 ROOM: [facility]01/[facility]01   CHIEF COMPLAINT  Chest Pain   HISTORY OF PRESENT ILLNESS  04/07/22 4:17 AM Richard Sampson is a 76 y.o. male with a known thoracic ascending aortic aneurysm.  He began having chest pain yesterday evening about 8:30 PM.  He initially attributed this to eating fried food for supper and he took Tums but without relief.  The pain is persisted though has waxed and waned since.  It has been as severe as an 8 out of 10 and has gone away completely at times (including right now).  He describes it as feeling a pressure or like indigestion.  It has also radiated into his back at times.  Nothing makes it better or worse.  He has no associated shortness of breath, diaphoresis or nausea.  He is on Eliquis and has been compliant.   Past Medical History:  Diagnosis Date   Agatston coronary artery calcium score between 100 and 199    a. 12/2019 Cardiac CT: 3 vessel cor Ca2+. Ca2+ = 144 (66th %i'le); b. 12/2019 MV: EF 60%, No ischemia/infarct. Ex time 7:30, Max HR 139.   Heart murmur    a. 09/2015 Echo: EF 55-60%, no rwma, GrI DD, mildly dil LA/RA.   High cholesterol    History of GIB (gastrointestinal bleeding)    a. Angiodysplasia of intestine several years ago.   Hypertension    PAF (paroxysmal atrial fibrillation) (HCC) 07/17/2014   a. CHA2DS2VASc = 3-->eliquis.   Pneumonia    Thoracic ascending aortic aneurysm (HCC)    a. 09/2020 CT Chest: Stable aneurysmal dilatation of the ascending thoracic aorta measuring 4.3 cm.    Past Surgical History:  Procedure Laterality Date   left wrist surgery Left 1966   glass cut ligaments, surgical repair   TONSILLECTOMY  1955   VASECTOMY  1980    Family History  Problem Relation Age of Onset   Thyroid nodules Mother    Dementia Mother    Heart failure Father    Cancer Father    Kidney disease Son    Diabetes  Brother    Cancer Brother    Heart attack Neg Hx    Stroke Neg Hx     Social History   Tobacco Use   Smoking status: Every Day    Types: Cigars   Smokeless tobacco: Never   Tobacco comments:    Started at age 34 - 1ppdx 44, quit x 5; pipex5 years, cigarsx15  Vaping Use   Vaping Use: Never used  Substance Use Topics   Alcohol use: Yes    Comment: 1.5 oz every three to four months   Drug use: Not Currently    Prior to Admission medications   Medication Sig Start Date End Date Taking? Authorizing Provider  acetaminophen (TYLENOL) 325 MG tablet Take 650 mg by mouth every 6 (six) hours as needed for moderate pain or mild pain. Patient not taking: Reported on 10/26/2021    [provider]  amLODipine (NORVASC) 10 MG tablet Take 10 mg by mouth daily.    [provider]  apixaban (ELIQUIS) 5 MG TABS tablet Take 1 tablet by mouth 2 times a day. Need to schedule appt with cardiologist for refills. 12/28/16   Duke Salvia, MD  atorvastatin (LIPITOR) 80 MG tablet Take 1 tablet (80 mg total) by mouth daily. Patient taking differently: Take 80 mg  by mouth daily. Pt states 40mg  daily 03/24/20   05/24/20, MD  calcium carbonate (TUMS EX) 750 MG chewable tablet Chew 2 tablets by mouth daily as needed for heartburn.    [provider]  cholecalciferol (VITAMIN D3) 10 MCG (400 UNIT) TABS tablet Take 400 Units by mouth.    [provider]  ferrous sulfate 325 (65 FE) MG tablet Take 325 mg by mouth daily with breakfast.    [provider]  pantoprazole (PROTONIX) 40 MG tablet Take 1 tablet (40 mg total) by mouth daily at 6 (six) AM. 07/29/21   14/9/22, MD  potassium chloride SA (K-DUR,KLOR-CON) 20 MEQ tablet Take 40 mEq by mouth 2 (two) times daily. Morning and evening    [provider]  triamterene-hydrochlorothiazide (MAXZIDE) 75-50 MG per tablet Take 0.5 tablets by mouth every evening.     [provider]    Allergies Ace  inhibitors, Angiotensin receptor blockers, Lisinopril, and Losartan   REVIEW OF SYSTEMS  Negative except as noted here or in the History of Present Illness.   PHYSICAL EXAMINATION  Initial Vital Signs Blood pressure (!) 144/90, pulse 71, temperature 98 F (36.7 C), temperature source Oral, resp. rate 17, height 5\' 10"  (1.778 m), weight 101.2 kg, SpO2 97 %.  Examination General: Well-developed, well-nourished male in no acute distress; appearance consistent with age of record HENT: normocephalic; atraumatic Eyes: Normal appearance Neck: supple Heart: regular rate and rhythm Lungs: clear to auscultation bilaterally Abdomen: soft; nondistended; nontender; bowel sounds present Extremities: No deformity; full range of motion; pulses normal; trace edema of lower legs Neurologic: Awake, alert and oriented; motor function intact in all extremities and symmetric; no facial droop Skin: Warm and dry Psychiatric: Normal mood and affect   RESULTS  Summary of this visit's results, reviewed and interpreted by myself:   EKG Interpretation  Date/Time:  Friday April 07 2022 04:11:43 EDT Ventricular Rate:  71 PR Interval:  197 QRS Duration: 108 QT Interval:  432 QTC Calculation: 470 R Axis:   258 Text Interpretation: Sinus rhythm Left atrial enlargement Left anterior fascicular block LVH by voltage No significant change was found Confirmed by ([id number]) on 04/07/2022 4:16:13 AM       Laboratory Studies: Results for orders placed or performed during the hospital encounter of 04/07/22 (from the past 24 hour(s))  CBC with Differential     Status: Abnormal   Collection Time: 04/07/22  4:17 AM  Result Value Ref Range   WBC 4.6 4.0 - 10.5 K/uL   RBC 3.96 (L) 4.22 - 5.81 MIL/uL   Hemoglobin 12.2 (L) 13.0 - 17.0 g/dL   HCT 04/09/22 (L) 04/09/22 - 60.4 %   MCV 91.7 80.0 - 100.0 fL   MCH 30.8 26.0 - 34.0 pg   MCHC 33.6 30.0 - 36.0 g/dL   RDW 54.0 98.1 - 19.1 %   Platelets 201 150 - 400  K/uL   nRBC 0.0 0.0 - 0.2 %   Neutrophils Relative % 59 %   Neutro Abs 2.7 1.7 - 7.7 K/uL   Lymphocytes Relative 28 %   Lymphs Abs 1.3 0.7 - 4.0 K/uL   Monocytes Relative 11 %   Monocytes Absolute 0.5 0.1 - 1.0 K/uL   Eosinophils Relative 2 %   Eosinophils Absolute 0.1 0.0 - 0.5 K/uL   Basophils Relative 0 %   Basophils Absolute 0.0 0.0 - 0.1 K/uL   Immature Granulocytes 0 %   Abs Immature Granulocytes 0.01  0.00 - 0.07 K/uL  Basic metabolic panel     Status: Abnormal   Collection Time: 04/07/22  4:17 AM  Result Value Ref Range   Sodium 140 135 - 145 mmol/L   Potassium 3.1 (L) 3.5 - 5.1 mmol/L   Chloride 106 98 - 111 mmol/L   CO2 27 22 - 32 mmol/L   Glucose, Bld 100 (H) 70 - 99 mg/dL   BUN 24 (H) 8 - 23 mg/dL   Creatinine, Ser 2.72 0.61 - 1.24 mg/dL   Calcium 9.5 8.9 - 53.6 mg/dL   GFR, Estimated >64 >40 mL/min   Anion gap 7 5 - 15  Troponin I (High Sensitivity)     Status: None   Collection Time: 04/07/22  4:17 AM  Result Value Ref Range   Troponin I (High Sensitivity) 3 <18 ng/L   Imaging Studies: CT Angio Chest Aorta W and/or Wo Contrast  Result Date: 04/07/2022 CLINICAL DATA:  Chest pain radiating to the back. History of ascending thoracic aortic aneurysm. EXAM: CT ANGIOGRAPHY CHEST WITH CONTRAST TECHNIQUE: Multidetector CT imaging of the chest was performed using the standard protocol during bolus administration of intravenous contrast. Multiplanar CT image reconstructions and MIPs were obtained to evaluate the vascular anatomy. RADIATION DOSE REDUCTION: This exam was performed according to the departmental dose-optimization program which includes automated exposure control, adjustment of the mA and/or kV according to patient size and/or use of iterative reconstruction technique. CONTRAST:  9mL OMNIPAQUE IOHEXOL 350 MG/ML SOLN COMPARISON:  07/27/2021 FINDINGS: Cardiovascular: Bolus timing for aorta on this study. Heart size upper normal to mildly enlarged without pericardial  effusion. Coronary artery calcification is evident. Ascending thoracic aorta measures 4.2 cm diameter. No evidence for dissection of the thoracic aorta. No wall thickening in the thoracic aorta. Pulmonary arteries are not well opacified but there is no large central filling defect in the pulmonary outflow tract or main pulmonary arteries. Mediastinum/Nodes: No mediastinal lymphadenopathy. 12 mm left thyroid nodule evident. Not clinically significant; no follow-up imaging recommended (ref: J Am Coll Radiol. 2015 Feb;12(2): 143-50).There is no hilar lymphadenopathy. The esophagus has normal imaging features. There is no axillary lymphadenopathy. Lungs/Pleura: No suspicious pulmonary nodule or mass. No focal airspace consolidation. There is no evidence of pleural effusion. Upper Abdomen: Several tiny hypodensities in the liver parenchyma are stable since 07/27/2021 consistent with benign etiology such as cyst. No followup recommended. Stable thickening left adrenal gland. Stable cysts again noted left kidney. No followup recommended. Musculoskeletal: No worrisome lytic or sclerotic osseous abnormality. Review of the MIP images confirms the above findings. IMPRESSION: 1. 4.2 cm ascending thoracic aortic aneurysm, stable since 07/21/2021. No evidence for dissection. 2. Poor opacification of the pulmonary arteries on this exam of the thoracic aorta. Within this limitation, there is no large central embolus in the pulmonary outflow tract or main pulmonary arteries. 3. No acute findings in the chest. Specifically, no findings to explain the patient's history of chest pain. Electronically Signed   By: Kennith Center M.D.   On: 04/07/2022 05:35   DG Chest 2 View  Result Date: 04/07/2022 CLINICAL DATA:  Chest pain. EXAM: CHEST - 2 VIEW COMPARISON:  July 27, 2021 FINDINGS: The heart size and mediastinal contours are within normal limits. Both lungs are clear. The visualized skeletal structures are unremarkable.  IMPRESSION: No active cardiopulmonary disease. Electronically Signed   By: Aram Candela M.D.   On: 04/07/2022 04:46    ED COURSE and MDM  Nursing notes, initial and subsequent vitals signs,  including pulse oximetry, reviewed and interpreted by myself.  Vitals:   04/07/22 0545 04/07/22 0600 04/07/22 0615 04/07/22 0630  BP: 134/86 (!) 138/92 136/89 (!) 134/94  Pulse: 60 (!) 57 60 60  Resp: 19 16 13 18   Temp:      TempSrc:      SpO2: 96% 94% 97% 96%  Weight:      Height:       Medications  potassium chloride (KLOR-CON) packet 40 mEq (40 mEq Oral Given 04/07/22 0526)  iohexol (OMNIPAQUE) 350 MG/ML injection 75 mL (75 mLs Intravenous Contrast Given 04/07/22 0501)  pantoprazole (PROTONIX) injection 40 mg (40 mg Intravenous Given 04/07/22 0526)  sucralfate (CARAFATE) 1 GM/10ML suspension 1 g (1 g Oral Given 04/07/22 0551)   6:31 AM Patient pain-free but has had a couple of brief episodes of chest discomfort while in the ED which he likens to indigestion. He was given IV Protonix and oral Carafate to treat possible GI etiology.  His CT is essentially unchanged with no evidence of aortic dissection or aneurysm rupture.  His first troponin is normal and his EKG is unchanged.  His second troponin is pending.   7:01 AM Signed out to Dr. 04/09/22.    PROCEDURES  Procedures   ED DIAGNOSES     ICD-10-CM   1. Chest pain at rest  R07.9          Eloise Harman, MD 04/07/22 [phone]

## 2022-04-07 NOTE — ED Provider Notes (Signed)
  Physical Exam  BP (!) 142/98 (BP Location: Right Arm)   Pulse 70   Temp 98 F (36.7 C) (Oral)   Resp 16   Ht 5\' 10"  (1.778 m)   Wt 101.2 kg   SpO2 99%   BMI 32.00 kg/m   Physical Exam Constitutional:      General: He is not in acute distress.    Appearance: Normal appearance. He is not diaphoretic.  HENT:     Head: Normocephalic and atraumatic.     Right Ear: External ear normal.     Left Ear: External ear normal.     Nose: Nose normal.  Eyes:     Extraocular Movements: Extraocular movements intact.  Cardiovascular:     Rate and Rhythm: Normal rate and regular rhythm.     Heart sounds: No murmur heard. Pulmonary:     Effort: Pulmonary effort is normal. No respiratory distress.  Neurological:     Mental Status: He is alert.     Procedures  Procedures  ED Course / MDM   Clinical Course as of 04/08/22 1224  Fri Apr 07, 2022  Apr 09, 2022 Assumed care from Dr [id number]. 76 yo M with TAA and AF on eliquis. Pw chest pain since 830 pm last night. Waxing and waning. Known thoracic aortic pathology with reassuring CTA today. Initial hs troponin of 3. Considering 3rd troponin vs obs.  [RP]  1300 Pt remained stable in the ED. 3rd troponin was WNL. Feel that ACS has been adequately evaluated for and his workup has been very reassuring. Discussed that pt should fu with his PCP as an outpatient and to continue protonix in case his symptoms are related to GERD. Return precautions discussed prior to dc.  [RP]    Clinical Course User Index [RP] 65, MD   Medical Decision Making Amount and/or Complexity of Data Reviewed Labs: ordered. Radiology: ordered.  Risk Prescription drug management.         Rondel Baton, MD 04/08/22 1224

## 2022-04-07 NOTE — Discharge Instructions (Signed)
Today you were seen in the emergency department for your your chest discomfort.    In the emergency department you had an EKG, lab work (troponins), and chest x-ray that were reassuring.    At home, please continue the Protonix you were prescribed for your reflux.    Follow-up with your primary doctor in 2-3 days regarding your visit and discussed with them the need for possible stress testing.  Return immediately to the emergency department if you experience any of the following: Worsening discomfort, unexplained sweating, vomiting, or any other concerning symptoms.    Thank you for visiting our Emergency Department. It was a pleasure taking care of you today.

## 2022-04-07 NOTE — ED Triage Notes (Signed)
Pt states he started having chest pain tonight around 2030  Pt states he ate fried food for supper so he thought it was that so he took some tums without relief  Pt states the pain is now radiating into his back  Pt states the pain started out like a burning and then he has sharp pain and pressure  Pt states he is not having any pain at this time but states it was an 8 earlier

## 2022-04-20 ENCOUNTER — Telehealth: Payer: Self-pay

## 2022-04-20 NOTE — Telephone Encounter (Signed)
Transition Care Management Follow-up Telephone Call Date of discharge and from where: 04/07/2022 [facility]  How have you been since you were released from the hospital? Called pt to check on status of health, pt stated he slowly but surely getting better. Ed f/u apt offered he did not feel the need to have one sch. He has an phys coming up 05/10/2021. Pt reassured, if apt needed, he could give the office a call back & we could get him in before phys.  Any questions or concerns? No  Items Reviewed: Did the pt receive and understand the discharge instructions provided? Yes  Medications obtained and verified? Yes  Other? No  Any new allergies since your discharge? No  Dietary orders reviewed? Yes Do you have support at home? Yes   Home Care and Equipment/Supplies: Were home health services ordered? no If so, what is the name of the agency? N/a  Has the agency set up a time to come to the patient's home? no Were any new equipment or medical supplies ordered?  No What is the name of the medical supply agency? N/a Were you able to get the supplies/equipment? no Do you have any questions related to the use of the equipment or supplies? No  Functional Questionnaire: (I = Independent and D = Dependent) ADLs: i  Bathing/Dressing- i  Meal Prep- i  Eating- i  Maintaining continence- i  Transferring/Ambulation- i  Managing Meds- i  Follow up appointments reviewed:  PCP Hospital f/u appt confirmed? Yes  Scheduled to see robyn sanders on 05/10/2022  @ [facility]. Specialist Hospital f/u appt confirmed? No  Scheduled to see n/a on n/a @ n/a. Are transportation arrangements needed? No  If their condition worsens, is the pt aware to call PCP or go to the Emergency Dept.? Yes Was the patient provided with contact information for the PCP's office or ED? Yes Was to pt encouraged to call back with questions or concerns? Yes

## 2022-05-09 ENCOUNTER — Ambulatory Visit (INDEPENDENT_AMBULATORY_CARE_PROVIDER_SITE_OTHER): Payer: Medicare PPO

## 2022-05-09 VITALS — BP 112/70 | HR 70 | Temp 98.4°F

## 2022-05-09 DIAGNOSIS — Z23 Encounter for immunization: Secondary | ICD-10-CM | POA: Diagnosis not present

## 2022-05-09 NOTE — Patient Instructions (Signed)
Influenza Virus Vaccine injection What is this medication? INFLUENZA VIRUS VACCINE (in floo EN zuh VAHY ruhs vak SEEN) helps to reduce the risk of getting influenza also known as the flu. The vaccine only helps protect you against some strains of the flu. This medicine may be used for other purposes; ask your health care provider or pharmacist if you have questions. COMMON BRAND NAME(S): Afluria, Afluria Quadrivalent, Agriflu, Alfuria, FLUAD, FLUAD Quadrivalent, Fluarix, Fluarix Quadrivalent, Flublok, Flublok Quadrivalent, FLUCELVAX, FLUCELVAX Quadrivalent, Flulaval, Flulaval Quadrivalent, Fluvirin, Fluzone, Fluzone High-Dose, Fluzone Intradermal, Fluzone Quadrivalent What should I tell my care team before I take this medication? They need to know if you have any of these conditions: bleeding disorder like hemophilia fever or infection Guillain-Barre syndrome or other neurological problems immune system problems infection with the human immunodeficiency virus (HIV) or AIDS low blood platelet counts multiple sclerosis an unusual or allergic reaction to influenza virus vaccine, latex, other medicines, foods, dyes, or preservatives. Different brands of vaccines contain different allergens. Some may contain latex or eggs. Talk to your doctor about your allergies to make sure that you get the right vaccine. pregnant or trying to get pregnant breast-feeding How should I use this medication? This vaccine is for injection into a muscle or under the skin. It is given by a health care professional. A copy of Vaccine Information Statements will be given before each vaccination. Read this sheet carefully each time. The sheet may change frequently. Talk to your healthcare provider to see which vaccines are right for you. Some vaccines should not be used in all age groups. Overdosage: If you think you have taken too much of this medicine contact a poison control center or emergency room at once. NOTE: This  medicine is only for you. Do not share this medicine with others. What if I miss a dose? This does not apply. What may interact with this medication? chemotherapy or radiation therapy medicines that lower your immune system like etanercept, anakinra, infliximab, and adalimumab medicines that treat or prevent blood clots like warfarin phenytoin steroid medicines like prednisone or cortisone theophylline vaccines This list may not describe all possible interactions. Give your health care provider a list of all the medicines, herbs, non-prescription drugs, or dietary supplements you use. Also tell them if you smoke, drink alcohol, or use illegal drugs. Some items may interact with your medicine. What should I watch for while using this medication? Report any side effects that do not go away within 3 days to your doctor or health care professional. Call your health care provider if any unusual symptoms occur within 6 weeks of receiving this vaccine. You may still catch the flu, but the illness is not usually as bad. You cannot get the flu from the vaccine. The vaccine will not protect against colds or other illnesses that may cause fever. The vaccine is needed every year. What side effects may I notice from receiving this medication? Side effects that you should report to your doctor or health care professional as soon as possible: allergic reactions like skin rash, itching or hives, swelling of the face, lips, or tongue Side effects that usually do not require medical attention (report to your doctor or health care professional if they continue or are bothersome): fever headache muscle aches and pains pain, tenderness, redness, or swelling at the injection site tiredness This list may not describe all possible side effects. Call your doctor for medical advice about side effects. You may report side effects to FDA at 1-800-FDA-1088.   Where should I keep my medication? The vaccine will be given  by a health care professional in a clinic, pharmacy, doctor's office, or other health care setting. You will not be given vaccine doses to store at home. NOTE: This sheet is a summary. It may not cover all possible information. If you have questions about this medicine, talk to your doctor, pharmacist, or health care provider.  2023 Elsevier/Gold Standard (2021-03-11 00:00:00)  

## 2022-05-09 NOTE — Progress Notes (Signed)
Patient presents today for flu shot.  

## 2022-05-23 ENCOUNTER — Ambulatory Visit (INDEPENDENT_AMBULATORY_CARE_PROVIDER_SITE_OTHER): Payer: Medicare PPO | Admitting: Internal Medicine

## 2022-05-23 ENCOUNTER — Encounter: Payer: Self-pay | Admitting: Internal Medicine

## 2022-05-23 VITALS — BP 128/68 | HR 78 | Temp 97.9°F | Ht 70.0 in | Wt 220.8 lb

## 2022-05-23 DIAGNOSIS — I700 Atherosclerosis of aorta: Secondary | ICD-10-CM

## 2022-05-23 DIAGNOSIS — E6609 Other obesity due to excess calories: Secondary | ICD-10-CM | POA: Diagnosis not present

## 2022-05-23 DIAGNOSIS — Z0000 Encounter for general adult medical examination without abnormal findings: Secondary | ICD-10-CM | POA: Diagnosis not present

## 2022-05-23 DIAGNOSIS — B360 Pityriasis versicolor: Secondary | ICD-10-CM | POA: Diagnosis not present

## 2022-05-23 DIAGNOSIS — I480 Paroxysmal atrial fibrillation: Secondary | ICD-10-CM

## 2022-05-23 DIAGNOSIS — E66811 Obesity, class 1: Secondary | ICD-10-CM

## 2022-05-23 DIAGNOSIS — I119 Hypertensive heart disease without heart failure: Secondary | ICD-10-CM

## 2022-05-23 DIAGNOSIS — E7800 Pure hypercholesterolemia, unspecified: Secondary | ICD-10-CM | POA: Diagnosis not present

## 2022-05-23 DIAGNOSIS — Z6831 Body mass index (BMI) 31.0-31.9, adult: Secondary | ICD-10-CM

## 2022-05-23 DIAGNOSIS — R972 Elevated prostate specific antigen [PSA]: Secondary | ICD-10-CM | POA: Diagnosis not present

## 2022-05-23 DIAGNOSIS — R351 Nocturia: Secondary | ICD-10-CM | POA: Diagnosis not present

## 2022-05-23 DIAGNOSIS — F17200 Nicotine dependence, unspecified, uncomplicated: Secondary | ICD-10-CM

## 2022-05-23 LAB — POCT URINALYSIS DIPSTICK
Bilirubin, UA: NEGATIVE
Blood, UA: NEGATIVE
Glucose, UA: POSITIVE — AB
Ketones, UA: NEGATIVE
Leukocytes, UA: NEGATIVE
Nitrite, UA: NEGATIVE
Protein, UA: NEGATIVE
Spec Grav, UA: 1.02 (ref 1.010–1.025)
Urobilinogen, UA: 0.2 E.U./dL
pH, UA: 7 (ref 5.0–8.0)

## 2022-05-23 MED ORDER — PANTOPRAZOLE SODIUM 40 MG PO TBEC: 40.0000 mg | DELAYED_RELEASE_TABLET | Freq: Every day | ORAL | 2 refills | Status: DC

## 2022-05-23 NOTE — Patient Instructions (Addendum)
Apply Selsun blue shampoo to affected areas, lather - sit for 10 minutes, then rinse off    Health Maintenance, Male Adopting a healthy lifestyle and getting preventive care are important in promoting health and wellness. Ask your health care provider about: The right schedule for you to have regular tests and exams. Things you can do on your own to prevent diseases and keep yourself healthy. What should I know about diet, weight, and exercise? Eat a healthy diet  Eat a diet that includes plenty of vegetables, fruits, low-fat dairy products, and lean protein. Do not eat a lot of foods that are high in solid fats, added sugars, or sodium. Maintain a healthy weight Body mass index (BMI) is a measurement that can be used to identify possible weight problems. It estimates body fat based on height and weight. Your health care provider can help determine your BMI and help you achieve or maintain a healthy weight. Get regular exercise Get regular exercise. This is one of the most important things you can do for your health. Most adults should: Exercise for at least 150 minutes each week. The exercise should increase your heart rate and make you sweat (moderate-intensity exercise). Do strengthening exercises at least twice a week. This is in addition to the moderate-intensity exercise. Spend less time sitting. Even light physical activity can be beneficial. Watch cholesterol and blood lipids Have your blood tested for lipids and cholesterol at 74 years of age, then have this test every 5 years. You may need to have your cholesterol levels checked more often if: Your lipid or cholesterol levels are high. You are older than 74 years of age. You are at high risk for heart disease. What should I know about cancer screening? Many types of cancers can be detected early and may often be prevented. Depending on your health history and family history, you may need to have cancer screening at various ages.  This may include screening for: Colorectal cancer. Prostate cancer. Skin cancer. Lung cancer. What should I know about heart disease, diabetes, and high blood pressure? Blood pressure and heart disease High blood pressure causes heart disease and increases the risk of stroke. This is more likely to develop in people who have high blood pressure readings or are overweight. Talk with your health care provider about your target blood pressure readings. Have your blood pressure checked: Every 3-5 years if you are 2-69 years of age. Every year if you are 19 years old or older. If you are between the ages of 64 and 40 and are a current or former smoker, ask your health care provider if you should have a one-time screening for abdominal aortic aneurysm (AAA). Diabetes Have regular diabetes screenings. This checks your fasting blood sugar level. Have the screening done: Once every three years after age 1 if you are at a normal weight and have a low risk for diabetes. More often and at a younger age if you are overweight or have a high risk for diabetes. What should I know about preventing infection? Hepatitis B If you have a higher risk for hepatitis B, you should be screened for this virus. Talk with your health care provider to find out if you are at risk for hepatitis B infection. Hepatitis C Blood testing is recommended for: Everyone born from [birth year] through 1965. Anyone with known risk factors for hepatitis C. Sexually transmitted infections (STIs) You should be screened each year for STIs, including gonorrhea and chlamydia, if: You are sexually  active and are younger than 74 years of age. You are older than 74 years of age and your health care provider tells you that you are at risk for this type of infection. Your sexual activity has changed since you were last screened, and you are at increased risk for chlamydia or gonorrhea. Ask your health care provider if you are at risk. Ask your  health care provider about whether you are at high risk for HIV. Your health care provider may recommend a prescription medicine to help prevent HIV infection. If you choose to take medicine to prevent HIV, you should first get tested for HIV. You should then be tested every 3 months for as long as you are taking the medicine. Follow these instructions at home: Alcohol use Do not drink alcohol if your health care provider tells you not to drink. If you drink alcohol: Limit how much you have to 0-2 drinks a day. Know how much alcohol is in your drink. In the [place] one drink equals one 12 oz bottle of beer (355 mL), one 5 oz glass of wine (148 mL), or one 1 oz glass of hard liquor (44 mL). Lifestyle Do not use any products that contain nicotine or tobacco. These products include cigarettes, chewing tobacco, and vaping devices, such as e-cigarettes. If you need help quitting, ask your health care provider. Do not use street drugs. Do not share needles. Ask your health care provider for help if you need support or information about quitting drugs. General instructions Schedule regular health, dental, and eye exams. Stay current with your vaccines. Tell your health care provider if: You often feel depressed. You have ever been abused or do not feel safe at home. Summary Adopting a healthy lifestyle and getting preventive care are important in promoting health and wellness. Follow your health care provider's instructions about healthy diet, exercising, and getting tested or screened for diseases. Follow your health care provider's instructions on monitoring your cholesterol and blood pressure. This information is not intended to replace advice given to you by your health care provider. Make sure you discuss any questions you have with your health care provider. Document Revised: 12/27/2020 Document Reviewed: 12/27/2020 Elsevier Patient Education  [place].

## 2022-05-23 NOTE — Progress Notes (Unsigned)
Richard Sampson,acting as a Education administrator for Richard Greenland, MD.,have documented all relevant documentation on the behalf of Richard Greenland, MD,as directed by  Richard Greenland, MD while in the presence of Richard Greenland, MD.   Subjective:     Patient ID: Richard Sampson , male    DOB: [birth year] , 76 y.o.   MRN: [id number]   Chief Complaint  Patient presents with   Annual Exam    HPI  He is here today for a full physical exam. He has no specific concerns or complaints at this time. He has his Urology exams performed at the [place]. He reports compliance with meds. He denies headaches, chest pain and shortness of breath.   Hypertension This is a chronic problem. The current episode started more than 1 year ago. The problem has been gradually improving since onset. The problem is controlled. Pertinent negatives include no blurred vision. Risk factors for coronary artery disease include dyslipidemia, obesity, male gender and sedentary lifestyle. Past treatments include calcium channel blockers and diuretics. The current treatment provides moderate improvement. Compliance problems include exercise.      Past Medical History:  Diagnosis Date   Agatston coronary artery calcium score between 100 and 199    a. 12/2019 Cardiac CT: 3 vessel cor Ca2+. Ca2+ = 144 (66th %i'le); b. 12/2019 MV: EF 60%, No ischemia/infarct. Ex time 7:30, Max HR 139.   Heart murmur    a. 09/2015 Echo: EF 55-60%, no rwma, GrI DD, mildly dil LA/RA.   High cholesterol    History of GIB (gastrointestinal bleeding)    a. Angiodysplasia of intestine several years ago.   Hypertension    PAF (paroxysmal atrial fibrillation) ([place]) 07/17/2014   a. CHA2DS2VASc = 3-->eliquis.   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 09/2020 CT Chest: Stable aneurysmal dilatation of the ascending thoracic aorta measuring 4.3 cm.     Family History  Problem Relation Age of Onset   Thyroid nodules Mother    Dementia Mother     Heart failure Father    Cancer Father    Kidney disease Son    Diabetes Brother    Cancer Brother    Heart attack Neg Hx    Stroke Neg Hx      Current Outpatient Medications:    amLODipine (NORVASC) 10 MG tablet, Take 10 mg by mouth daily., Disp: , Rfl:    apixaban (ELIQUIS) 5 MG TABS tablet, Take 1 tablet by mouth 2 times a day. Need to schedule appt with cardiologist for refills., Disp: 60 tablet, Rfl: 0   atorvastatin (LIPITOR) 80 MG tablet, Take 1 tablet (80 mg total) by mouth daily. (Patient taking differently: Take 80 mg by mouth daily. Pt states 56m daily), Disp: 90 tablet, Rfl: 3   cholecalciferol (VITAMIN D3) 10 MCG (400 UNIT) TABS tablet, Take 400 Units by mouth., Disp: , Rfl:    ferrous sulfate 325 (65 FE) MG tablet, Take 325 mg by mouth daily with breakfast., Disp: , Rfl:    potassium chloride SA (K-DUR,KLOR-CON) 20 MEQ tablet, Take 40 mEq by mouth 2 (two) times daily. Morning and evening, Disp: , Rfl:    triamterene-hydrochlorothiazide (MAXZIDE) 75-50 MG per tablet, Take 0.5 tablets by mouth every evening. , Disp: , Rfl:    pantoprazole (PROTONIX) 40 MG tablet, Take 1 tablet (40 mg total) by mouth daily at 6 (six) AM., Disp: 90 tablet, Rfl: 2   Allergies  Allergen Reactions  Ace Inhibitors Swelling    Throat swelling; pt was on lisinopril   Angiotensin Receptor Blockers Swelling    Cross reactivity in known ACE I allergy   Lisinopril Swelling    Throat swelling   Losartan Other (See Comments)    Doctor told pt not to take     Men's preventive visit. Patient Health Questionnaire (PHQ-2) is  Flowsheet Row Clinical Support from 10/26/2021 in [facility]  PHQ-2 Total Score 0     . Patient is on a healthy, low sodium diet. Marital status: Married. Relevant history for alcohol use is:  Social History   Substance and Sexual Activity  Alcohol Use Yes   Comment: 1.5 oz every three to four months  . Relevant history for tobacco use is:  Social  History   Tobacco Use  Smoking Status Every Day   Types: Cigars  Smokeless Tobacco Never  Tobacco Comments   Started at age 61 - 1ppdx 55, quit x 5; pipex5 years, cigarsx15  .   Review of Systems  Constitutional: Negative.   HENT: Negative.    Eyes: Negative.  Negative for blurred vision.  Respiratory: Negative.    Cardiovascular: Negative.   Gastrointestinal: Negative.   Endocrine: Negative.   Genitourinary: Negative.   Musculoskeletal: Negative.   Skin: Negative.   Neurological: Negative.   Hematological: Negative.   Psychiatric/Behavioral: Negative.       Today's Vitals   05/23/22 0830  BP: 128/68  Pulse: 78  Temp: 97.9 F (36.6 C)  Weight: 220 lb 12.8 oz (100.2 kg)  Height: $Remove'5\' 10"'kXEJrsu$  (1.778 m)  PainSc: 0-No pain   Body mass index is 31.68 kg/m.  Wt Readings from Last 3 Encounters:  05/23/22 220 lb 12.8 oz (100.2 kg)  04/07/22 223 lb (101.2 kg)  10/26/21 219 lb 12.8 oz (99.7 kg)    Objective:  Physical Exam Vitals and nursing note reviewed.  Constitutional:      Appearance: Normal appearance.  HENT:     Head: Normocephalic and atraumatic.     Right Ear: Tympanic membrane, ear canal and external ear normal.     Left Ear: Tympanic membrane, ear canal and external ear normal.     Nose: Nose normal.     Mouth/Throat:     Mouth: Mucous membranes are moist.     Pharynx: Oropharynx is clear.  Eyes:     Extraocular Movements: Extraocular movements intact.     Conjunctiva/sclera: Conjunctivae normal.     Pupils: Pupils are equal, round, and reactive to light.  Cardiovascular:     Rate and Rhythm: Normal rate and regular rhythm.     Pulses: Normal pulses.     Heart sounds: Normal heart sounds.  Pulmonary:     Effort: Pulmonary effort is normal.     Breath sounds: Normal breath sounds.  Chest:  Breasts:    Right: Normal. No swelling, bleeding, inverted nipple, mass or nipple discharge.     Left: Normal. No swelling, bleeding, inverted nipple, mass or nipple  discharge.  Abdominal:     General: Abdomen is flat. Bowel sounds are normal.     Palpations: Abdomen is soft.  Genitourinary:    Comments: Deferred as per patient Musculoskeletal:        General: Normal range of motion.     Cervical back: Normal range of motion and neck supple.  Skin:    General: Skin is warm.     Comments: Areas of hyperpigmentation on anterior chest in midline  and below his neck. NO vesicular lesions noted.   Neurological:     General: No focal deficit present.     Mental Status: He is alert.  Psychiatric:        Mood and Affect: Mood normal.        Behavior: Behavior normal.         Assessment And Plan:    1. Encounter for general adult medical examination w/o abnormal findings Comments: A full exam was performed. DRE deferred as per patient request. PATIENT IS ADVISED TO GET 30-45 MINUTES REGULAR EXERCISE NO LESS THAN FOUR TO FIVE DAYS PER WEEK - BOTH WEIGHTBEARING EXERCISES AND AEROBIC ARE RECOMMENDED.  PATIENT IS ADVISED TO FOLLOW A HEALTHY DIET WITH AT LEAST SIX FRUITS/VEGGIES PER DAY, DECREASE INTAKE OF RED MEAT, AND TO INCREASE FISH INTAKE TO TWO DAYS PER WEEK.  MEATS/FISH SHOULD NOT BE FRIED, BAKED OR BROILED IS PREFERABLE.  IT IS ALSO IMPORTANT TO CUT BACK ON YOUR SUGAR INTAKE. PLEASE AVOID ANYTHING WITH ADDED SUGAR, CORN SYRUP OR OTHER SWEETENERS. IF YOU MUST USE A SWEETENER, YOU CAN TRY STEVIA. IT IS ALSO IMPORTANT TO AVOID ARTIFICIALLY SWEETENERS AND DIET BEVERAGES. LASTLY, I SUGGEST WEARING SPF 50 SUNSCREEN ON EXPOSED PARTS AND ESPECIALLY WHEN IN THE DIRECT SUNLIGHT FOR AN EXTENDED PERIOD OF TIME.  PLEASE AVOID FAST FOOD RESTAURANTS AND INCREASE YOUR WATER INTAKE.  2. Hypertensive heart disease without heart failure Comments: Chronic, well controlled.  EKG performed, NSR w/ PACs, nonspecific QRS widening, AFB, and nonspecific T abnormality. He will c/w amlodipine 69m and 1/2 Maxzide 75/550m He will rto in 6 months for re-evaluation.  - EKG 12-Lead -  POCT Urinalysis Dipstick (81002) - Microalbumin / Creatinine Urine Ratio - Lipid panel - CBC no Diff - Hemoglobin A1c - CMP14+EGFR  3. Pure hypercholesterolemia Comments: Chronic, LDL goal <70.  He is currently taking atorvastatin 8068maily without any issues.  - Lipid panel - CMP14+EGFR  4. Atherosclerosis of aorta (HCC) Comments: Chronic, LDL goal <70.  He will c/w atorvastatin. He is not on ASA due to anticoagulation w/ Eliquis.   5. Abnormal PSA Comments: He is followed by Urology. Most recent notes were reviewed.   6. Paroxysmal atrial fibrillation (HCC) Comments: Chronic, currently in NSR. He is properly anticoagulated with Eliquis BID.   7. Tobacco use disorder Comments: Previous low dose CT results reviewed in detail. He agrees to repeat testing.  - CT CHEST LUNG CA SCREEN LOW DOSE W/O CM; Future  8. Nocturia - PSA Total (Reflex To Free)  9. Tinea versicolor Comments: He is advised to apply Selsun Blue lotion to affected area, lather and rinse after ten minutes.   10. Class 1 obesity due to excess calories with serious comorbidity and body mass index (BMI) of 31.0 to 31.9 in adult Comments: He is encouraged to aim for at least 150 minutes of exercise per week, while striving BMI<30 to decrease cardiac risk.   Patient was given opportunity to ask questions. Patient verbalized understanding of the plan and was able to repeat key elements of the plan. All questions were answered to their satisfaction.   I, RobMaximino GreenlandD, have reviewed all documentation for this visit. The documentation on 05/23/22 for the exam, diagnosis, procedures, and orders are all accurate and complete.   THE PATIENT IS ENCOURAGED TO PRACTICE SOCIAL DISTANCING DUE TO THE COVID-19 PANDEMIC.

## 2022-05-24 ENCOUNTER — Encounter: Payer: Self-pay | Admitting: Internal Medicine

## 2022-05-24 LAB — CMP14+EGFR
ALT: 14 IU/L (ref 0–44)
AST: 13 IU/L (ref 0–40)
Albumin/Globulin Ratio: 1.8 (ref 1.2–2.2)
Albumin: 4.4 g/dL (ref 3.8–4.8)
Alkaline Phosphatase: 80 IU/L (ref 44–121)
BUN/Creatinine Ratio: 13 (ref 10–24)
BUN: 14 mg/dL (ref 8–27)
Bilirubin Total: 0.3 mg/dL (ref 0.0–1.2)
CO2: 23 mmol/L (ref 20–29)
Calcium: 9.3 mg/dL (ref 8.6–10.2)
Chloride: 103 mmol/L (ref 96–106)
Creatinine, Ser: 1.06 mg/dL (ref 0.76–1.27)
Globulin, Total: 2.4 g/dL (ref 1.5–4.5)
Glucose: 94 mg/dL (ref 70–99)
Potassium: 3.6 mmol/L (ref 3.5–5.2)
Sodium: 141 mmol/L (ref 134–144)
Total Protein: 6.8 g/dL (ref 6.0–8.5)
eGFR: 74 mL/min/{1.73_m2} (ref >59)

## 2022-05-24 LAB — PSA TOTAL (REFLEX TO FREE): Prostate Specific Ag, Serum: 1.7 ng/mL (ref 0.0–4.0)

## 2022-05-24 LAB — LIPID PANEL
Chol/HDL Ratio: 2.5 ratio (ref 0.0–5.0)
Cholesterol, Total: 136 mg/dL (ref 100–199)
HDL: 54 mg/dL (ref >39)
LDL Chol Calc (NIH): 68 mg/dL (ref 0–99)
Triglycerides: 72 mg/dL (ref 0–149)
VLDL Cholesterol Cal: 14 mg/dL (ref 5–40)

## 2022-05-24 LAB — HEMOGLOBIN A1C
Est. average glucose Bld gHb Est-mCnc: 111 mg/dL
Hgb A1c MFr Bld: 5.5 % (ref 4.8–5.6)

## 2022-05-24 LAB — CBC
Hematocrit: 39.2 % (ref 37.5–51.0)
Hemoglobin: 13.1 g/dL (ref 13.0–17.7)
MCH: 30.3 pg (ref 26.6–33.0)
MCHC: 33.4 g/dL (ref 31.5–35.7)
MCV: 91 fL (ref 79–97)
Platelets: 235 10*3/uL (ref 150–450)
RBC: 4.32 x10E6/uL (ref 4.14–5.80)
RDW: 12.5 % (ref 11.6–15.4)
WBC: 3.9 10*3/uL (ref 3.4–10.8)

## 2022-05-24 LAB — MICROALBUMIN / CREATININE URINE RATIO
Creatinine, Urine: 128.8 mg/dL
Microalb/Creat Ratio: 11 mg/g creat (ref 0–29)
Microalbumin, Urine: 13.9 ug/mL

## 2022-05-25 ENCOUNTER — Other Ambulatory Visit: Payer: Self-pay

## 2022-05-25 MED ORDER — ATORVASTATIN CALCIUM 80 MG PO TABS: 80.0000 mg | ORAL_TABLET | Freq: Every day | ORAL | 3 refills | Status: DC

## 2022-05-25 NOTE — Addendum Note (Signed)
Addended by: Blanca Friend on: 05/25/2022 05:21 PM   Modules accepted: Orders

## 2022-06-06 ENCOUNTER — Ambulatory Visit (HOSPITAL_BASED_OUTPATIENT_CLINIC_OR_DEPARTMENT_OTHER)
Admission: RE | Admit: 2022-06-06 | Discharge: 2022-06-06 | Disposition: A | Discharge status: Home / Self Care | Payer: Medicare PPO | Source: Ambulatory Visit | Attending: Internal Medicine | Admitting: Internal Medicine

## 2022-06-06 DIAGNOSIS — Z122 Encounter for screening for malignant neoplasm of respiratory organs: Secondary | ICD-10-CM | POA: Diagnosis not present

## 2022-06-06 DIAGNOSIS — F17210 Nicotine dependence, cigarettes, uncomplicated: Secondary | ICD-10-CM | POA: Insufficient documentation

## 2022-06-06 DIAGNOSIS — F17200 Nicotine dependence, unspecified, uncomplicated: Secondary | ICD-10-CM | POA: Insufficient documentation

## 2022-08-30 DIAGNOSIS — E876 Hypokalemia: Secondary | ICD-10-CM | POA: Diagnosis not present

## 2022-08-30 DIAGNOSIS — I129 Hypertensive chronic kidney disease with stage 1 through stage 4 chronic kidney disease, or unspecified chronic kidney disease: Secondary | ICD-10-CM | POA: Diagnosis not present

## 2022-08-30 DIAGNOSIS — L309 Dermatitis, unspecified: Secondary | ICD-10-CM | POA: Diagnosis not present

## 2022-08-30 DIAGNOSIS — K219 Gastro-esophageal reflux disease without esophagitis: Secondary | ICD-10-CM | POA: Diagnosis not present

## 2022-08-30 DIAGNOSIS — N182 Chronic kidney disease, stage 2 (mild): Secondary | ICD-10-CM | POA: Diagnosis not present

## 2022-08-30 DIAGNOSIS — E559 Vitamin D deficiency, unspecified: Secondary | ICD-10-CM | POA: Diagnosis not present

## 2022-08-30 DIAGNOSIS — E785 Hyperlipidemia, unspecified: Secondary | ICD-10-CM | POA: Diagnosis not present

## 2022-08-30 DIAGNOSIS — E669 Obesity, unspecified: Secondary | ICD-10-CM | POA: Diagnosis not present

## 2022-08-30 DIAGNOSIS — I4891 Unspecified atrial fibrillation: Secondary | ICD-10-CM | POA: Diagnosis not present

## 2022-10-31 ENCOUNTER — Encounter: Payer: Self-pay | Admitting: Internal Medicine

## 2022-10-31 ENCOUNTER — Ambulatory Visit (INDEPENDENT_AMBULATORY_CARE_PROVIDER_SITE_OTHER): Payer: Medicare PPO | Admitting: Internal Medicine

## 2022-10-31 VITALS — BP 122/88 | HR 89 | Temp 98.3°F | Ht 70.0 in | Wt 224.0 lb

## 2022-10-31 DIAGNOSIS — E7800 Pure hypercholesterolemia, unspecified: Secondary | ICD-10-CM

## 2022-10-31 DIAGNOSIS — I480 Paroxysmal atrial fibrillation: Secondary | ICD-10-CM

## 2022-10-31 DIAGNOSIS — I2510 Atherosclerotic heart disease of native coronary artery without angina pectoris: Secondary | ICD-10-CM

## 2022-10-31 DIAGNOSIS — D649 Anemia, unspecified: Secondary | ICD-10-CM

## 2022-10-31 DIAGNOSIS — I700 Atherosclerosis of aorta: Secondary | ICD-10-CM

## 2022-10-31 DIAGNOSIS — D6869 Other thrombophilia: Secondary | ICD-10-CM

## 2022-10-31 DIAGNOSIS — R351 Nocturia: Secondary | ICD-10-CM

## 2022-10-31 DIAGNOSIS — I119 Hypertensive heart disease without heart failure: Secondary | ICD-10-CM | POA: Diagnosis not present

## 2022-10-31 DIAGNOSIS — E876 Hypokalemia: Secondary | ICD-10-CM

## 2022-10-31 DIAGNOSIS — Z6832 Body mass index (BMI) 32.0-32.9, adult: Secondary | ICD-10-CM | POA: Diagnosis not present

## 2022-10-31 DIAGNOSIS — F17200 Nicotine dependence, unspecified, uncomplicated: Secondary | ICD-10-CM | POA: Diagnosis not present

## 2022-10-31 DIAGNOSIS — E6609 Other obesity due to excess calories: Secondary | ICD-10-CM

## 2022-10-31 LAB — CMP14+EGFR
ALT: 19 IU/L (ref 0–44)
AST: 17 IU/L (ref 0–40)
Albumin/Globulin Ratio: 1.7 (ref 1.2–2.2)
Albumin: 4.2 g/dL (ref 3.8–4.8)
Alkaline Phosphatase: 87 IU/L (ref 44–121)
BUN/Creatinine Ratio: 14 (ref 10–24)
BUN: 16 mg/dL (ref 8–27)
Bilirubin Total: 0.3 mg/dL (ref 0.0–1.2)
CO2: 26 mmol/L (ref 20–29)
Calcium: 9.5 mg/dL (ref 8.6–10.2)
Chloride: 106 mmol/L (ref 96–106)
Creatinine, Ser: 1.18 mg/dL (ref 0.76–1.27)
Globulin, Total: 2.5 g/dL (ref 1.5–4.5)
Glucose: 95 mg/dL (ref 70–99)
Potassium: 3.7 mmol/L (ref 3.5–5.2)
Sodium: 146 mmol/L — ABNORMAL HIGH (ref 134–144)
Total Protein: 6.7 g/dL (ref 6.0–8.5)
eGFR: 65 mL/min/{1.73_m2} (ref >59)

## 2022-10-31 LAB — LIPID PANEL
Chol/HDL Ratio: 2.5 ratio (ref 0.0–5.0)
Cholesterol, Total: 130 mg/dL (ref 100–199)
HDL: 51 mg/dL (ref >39)
LDL Chol Calc (NIH): 63 mg/dL (ref 0–99)
Triglycerides: 79 mg/dL (ref 0–149)
VLDL Cholesterol Cal: 16 mg/dL (ref 5–40)

## 2022-10-31 LAB — CBC
Hematocrit: 41 % (ref 37.5–51.0)
Hemoglobin: 13.6 g/dL (ref 13.0–17.7)
MCH: 30.5 pg (ref 26.6–33.0)
MCHC: 33.2 g/dL (ref 31.5–35.7)
MCV: 92 fL (ref 79–97)
Platelets: 255 10*3/uL (ref 150–450)
RBC: 4.46 x10E6/uL (ref 4.14–5.80)
RDW: 13.2 % (ref 11.6–15.4)
WBC: 3.8 10*3/uL (ref 3.4–10.8)

## 2022-10-31 NOTE — Patient Instructions (Signed)
Hypertension, Adult ?Hypertension is another name for high blood pressure. High blood pressure forces your heart to work harder to pump blood. This can cause problems over time. ?There are two numbers in a blood pressure reading. There is a top number (systolic) over a bottom number (diastolic). It is best to have a blood pressure that is below 120/80. ?What are the causes? ?The cause of this condition is not known. Some other conditions can lead to high blood pressure. ?What increases the risk? ?Some lifestyle factors can make you more likely to develop high blood pressure: ?Smoking. ?Not getting enough exercise or physical activity. ?Being overweight. ?Having too much fat, sugar, calories, or salt (sodium) in your diet. ?Drinking too much alcohol. ?Other risk factors include: ?Having any of these conditions: ?Heart disease. ?Diabetes. ?High cholesterol. ?Kidney disease. ?Obstructive sleep apnea. ?Having a family history of high blood pressure and high cholesterol. ?Age. The risk increases with age. ?Stress. ?What are the signs or symptoms? ?High blood pressure may not cause symptoms. Very high blood pressure (hypertensive crisis) may cause: ?Headache. ?Fast or uneven heartbeats (palpitations). ?Shortness of breath. ?Nosebleed. ?Vomiting or feeling like you may vomit (nauseous). ?Changes in how you see. ?Very bad chest pain. ?Feeling dizzy. ?Seizures. ?How is this treated? ?This condition is treated by making healthy lifestyle changes, such as: ?Eating healthy foods. ?Exercising more. ?Drinking less alcohol. ?Your doctor may prescribe medicine if lifestyle changes do not help enough and if: ?Your top number is above 130. ?Your bottom number is above 80. ?Your personal target blood pressure may vary. ?Follow these instructions at home: ?Eating and drinking ? ?If told, follow the DASH eating plan. To follow this plan: ?Fill one half of your plate at each meal with fruits and vegetables. ?Fill one fourth of your plate  at each meal with whole grains. Whole grains include whole-wheat pasta, brown rice, and whole-grain bread. ?Eat or drink low-fat dairy products, such as skim milk or low-fat yogurt. ?Fill one fourth of your plate at each meal with low-fat (lean) proteins. Low-fat proteins include fish, chicken without skin, eggs, beans, and tofu. ?Avoid fatty meat, cured and processed meat, or chicken with skin. ?Avoid pre-made or processed food. ?Limit the amount of salt in your diet to less than 1,500 mg each day. ?Do not drink alcohol if: ?Your doctor tells you not to drink. ?You are pregnant, may be pregnant, or are planning to become pregnant. ?If you drink alcohol: ?Limit how much you have to: ?0-1 drink a day for women. ?0-2 drinks a day for men. ?Know how much alcohol is in your drink. In the [place] one drink equals one 12 oz bottle of beer (355 mL), one 5 oz glass of wine (148 mL), or one 1? oz glass of hard liquor (44 mL). ?Lifestyle ? ?Work with your doctor to stay at a healthy weight or to lose weight. Ask your doctor what the best weight is for you. ?Get at least 30 minutes of exercise that causes your heart to beat faster (aerobic exercise) most days of the week. This may include walking, swimming, or biking. ?Get at least 30 minutes of exercise that strengthens your muscles (resistance exercise) at least 3 days a week. This may include lifting weights or doing Pilates. ?Do not smoke or use any products that contain nicotine or tobacco. If you need help quitting, ask your doctor. ?Check your blood pressure at home as told by your doctor. ?Keep all follow-up visits. ?Medicines ?Take over-the-counter and prescription medicines   only as told by your doctor. Follow directions carefully. ?Do not skip doses of blood pressure medicine. The medicine does not work as well if you skip doses. Skipping doses also puts you at risk for problems. ?Ask your doctor about side effects or reactions to medicines that you should watch  for. ?Contact a doctor if: ?You think you are having a reaction to the medicine you are taking. ?You have headaches that keep coming back. ?You feel dizzy. ?You have swelling in your ankles. ?You have trouble with your vision. ?Get help right away if: ?You get a very bad headache. ?You start to feel mixed up (confused). ?You feel weak or numb. ?You feel faint. ?You have very bad pain in your: ?Chest. ?Belly (abdomen). ?You vomit more than once. ?You have trouble breathing. ?These symptoms may be an emergency. Get help right away. Call 911. ?Do not wait to see if the symptoms will go away. ?Do not drive yourself to the hospital. ?Summary ?Hypertension is another name for high blood pressure. ?High blood pressure forces your heart to work harder to pump blood. ?For most people, a normal blood pressure is less than 120/80. ?Making healthy choices can help lower blood pressure. If your blood pressure does not get lower with healthy choices, you may need to take medicine. ?This information is not intended to replace advice given to you by your health care provider. Make sure you discuss any questions you have with your health care provider. ?Document Revised: 05/26/2021 Document Reviewed: 05/26/2021 ?Elsevier Patient Education ? 2023 [employer] ? ?

## 2022-10-31 NOTE — Progress Notes (Signed)
I,Victoria T Hamilton,acting as a scribe for [name], MD.,have documented all relevant documentation on the behalf of [name], MD,as directed by  [name], MD while in the presence of [name], MD.    Subjective:     Patient ID: Richard Sampson , male    DOB: 1946/08/09 , 77 y.o.   MRN: [id number]   Chief Complaint  Patient presents with   Hypertension   Hyperlipidemia    HPI  He is here today for BP and Chol check. He reports compliance with meds.  He denies having any headaches, chest pain and shortness of breath.   [facility] rejected for tdap.   He reports having an upcoming surgery. When he completed CT scan a growth was found on his left kidney.     Hypertension This is a chronic problem. The current episode started more than 1 year ago. The problem has been gradually improving since onset. The problem is controlled. Pertinent negatives include no blurred vision, chest pain, palpitations or shortness of breath. The current treatment provides moderate improvement. Compliance problems include exercise.   Hyperlipidemia This is a chronic problem. The current episode started more than 1 year ago. The problem is controlled. Recent lipid tests were reviewed and are normal. Exacerbating diseases include obesity. He has no history of diabetes or hypothyroidism. Pertinent negatives include no chest pain or shortness of breath. Current antihyperlipidemic treatment includes statins. There are no compliance problems.      Past Medical History:  Diagnosis Date   Agatston coronary artery calcium score between 100 and 199    a. 12/2019 Cardiac CT: 3 vessel cor Ca2+. Ca2+ = 144 (66th %i'le); b. 12/2019 MV: EF 60%, No ischemia/infarct. Ex time 7:30, Max HR 139.   Heart murmur    a. 09/2015 Echo: EF 55-60%, no rwma, GrI DD, mildly dil LA/RA.   High cholesterol    History of GIB (gastrointestinal bleeding)    a. Angiodysplasia of intestine several years ago.    Hypertension    PAF (paroxysmal atrial fibrillation) ([place]) 07/17/2014   a. CHA2DS2VASc = 3-->eliquis.   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 09/2020 CT Chest: Stable aneurysmal dilatation of the ascending thoracic aorta measuring 4.3 cm.     Family History  Problem Relation Age of Onset   Thyroid nodules Mother    Dementia Mother    Heart failure Father    Cancer Father    Kidney disease Son    Diabetes Brother    Cancer Brother    Heart attack Neg Hx    Stroke Neg Hx      Current Outpatient Medications:    amLODipine (NORVASC) 10 MG tablet, Take 10 mg by mouth daily., Disp: , Rfl:    apixaban (ELIQUIS) 5 MG TABS tablet, Take 1 tablet by mouth 2 times a day. Need to schedule appt with cardiologist for refills., Disp: 60 tablet, Rfl: 0   atorvastatin (LIPITOR) 40 MG tablet, Take 40 mg by mouth daily., Disp: , Rfl:    cholecalciferol (VITAMIN D3) 10 MCG (400 UNIT) TABS tablet, Take 400 Units by mouth., Disp: , Rfl:    ferrous sulfate 325 (65 FE) MG tablet, Take 325 mg by mouth daily with breakfast., Disp: , Rfl:    pantoprazole (PROTONIX) 40 MG tablet, Take 1 tablet (40 mg total) by mouth daily at 6 (six) AM., Disp: 90 tablet, Rfl: 2   potassium chloride SA (K-DUR,KLOR-CON)  20 MEQ tablet, Take 40 mEq by mouth 2 (two) times daily. Morning and evening, Disp: , Rfl:    triamterene-hydrochlorothiazide (MAXZIDE) 75-50 MG per tablet, Take 0.5 tablets by mouth every evening. , Disp: , Rfl:    Allergies  Allergen Reactions   Ace Inhibitors Swelling    Throat swelling; pt was on lisinopril   Angiotensin Receptor Blockers Swelling    Cross reactivity in known ACE I allergy   Lisinopril Swelling    Throat swelling   Losartan Other (See Comments)    Doctor told pt not to take     Review of Systems  Constitutional: Negative.   HENT: Negative.    Eyes:  Negative for blurred vision.  Respiratory:  Negative for shortness of breath.   Cardiovascular:  Negative for  chest pain and palpitations.  Gastrointestinal: Negative.   Genitourinary: Negative.   Skin: Negative.   Allergic/Immunologic: Negative.   Hematological: Negative.      Today's Vitals   10/31/22 0859  BP: 122/88  Pulse: 89  Temp: 98.3 F (36.8 C)  SpO2: 96%  Weight: 224 lb (101.6 kg)  Height: '5\' 10"'$  (1.778 m)   Body mass index is 32.14 kg/m. Wt Readings from Last 3 Encounters:  10/31/22 224 lb (101.6 kg)  05/23/22 220 lb 12.8 oz (100.2 kg)  04/07/22 223 lb (101.2 kg)    Objective:  Physical Exam      Assessment And Plan:     1. Hypertensive heart disease without heart failure  2. Atherosclerosis of aorta (HCC)  3. Paroxysmal atrial fibrillation ([place])  4. Pure hypercholesterolemia  5. Nocturia  6. Anemia, unspecified type  7. Hypokalemia  8. Class 1 obesity due to excess calories with serious comorbidity and body mass index (BMI) of 32.0 to 32.9 in adult  9. Tobacco use disorder He is encouraged to strive for BMI less than 30 to decrease cardiac risk. Advised to aim for at least 150 minutes of exercise per week.     Patient was given opportunity to ask questions. Patient verbalized understanding of the plan and was able to repeat key elements of the plan. All questions were answered to their satisfaction.  [name], MD   I, [name], MD, have reviewed all documentation for this visit. The documentation on 10/31/22 for the exam, diagnosis, procedures, and orders are all accurate and complete.   IF YOU HAVE BEEN REFERRED TO A SPECIALIST, IT MAY TAKE 1-2 WEEKS TO SCHEDULE/PROCESS THE REFERRAL. IF YOU HAVE NOT HEARD FROM US/SPECIALIST IN TWO WEEKS, PLEASE GIVE [place] A CALL AT [phone] X [phone].   THE PATIENT IS ENCOURAGED TO PRACTICE SOCIAL DISTANCING DUE TO THE COVID-19 PANDEMIC.

## 2022-11-02 ENCOUNTER — Ambulatory Visit: Payer: TRICARE For Life (TFL) | Admitting: Internal Medicine

## 2022-11-02 ENCOUNTER — Ambulatory Visit (INDEPENDENT_AMBULATORY_CARE_PROVIDER_SITE_OTHER): Payer: Medicare PPO

## 2022-11-02 VITALS — Ht 70.0 in | Wt 224.0 lb

## 2022-11-02 DIAGNOSIS — Z0000 Encounter for general adult medical examination without abnormal findings: Secondary | ICD-10-CM | POA: Diagnosis not present

## 2022-11-02 DIAGNOSIS — D6869 Other thrombophilia: Secondary | ICD-10-CM | POA: Insufficient documentation

## 2022-11-02 NOTE — Progress Notes (Signed)
I connected with  Colin Broach on 11/02/22 by a audio enabled telemedicine application and verified that I am speaking with the correct person using two identifiers.  Patient Location: Home  Provider Location: Office/Clinic  I discussed the limitations of evaluation and management by telemedicine. The patient expressed understanding and agreed to proceed.  Subjective:   Richard Sampson is a 77 y.o. male who presents for Medicare Annual/Subsequent preventive examination.  Review of Systems     Cardiac Risk Factors include: advanced age (>80mn, >>24women);dyslipidemia;hypertension;male gender;obesity (BMI >30kg/m2)     Objective:    Today's Vitals   11/02/22 0856 11/02/22 0857  Weight: 224 lb (101.6 kg)   Height: '5\' 10"'$  (1.778 m)   PainSc:  8    Body mass index is 32.14 kg/m.     11/02/2022    9:04 AM 04/07/2022    4:15 AM 10/26/2021    8:31 AM 07/27/2021    4:24 PM 07/27/2021    8:10 AM 04/19/2021    7:26 PM 10/20/2020    8:48 AM  Advanced Directives  Does Patient Have a Medical Advance Directive? Yes Yes Yes Yes No No Yes  Type of AParamedicof [place]arm SpringsLiving will [place]ag[place]Living will [place]Living will Living will   [place]Living will  Does patient want to make changes to medical advance directive?  No - Patient declined  No - Patient declined     Copy of [place] Chart? Yes - validated most recent copy scanned in chart (See row information) No - copy requested Yes - validated most recent copy scanned in chart (See row information)    Yes - validated most recent copy scanned in chart (See row information)  Would patient like information on creating a medical advance directive?    No - Patient declined No - Patient declined      Current Medications (verified) Outpatient Encounter Medications as of 11/02/2022  Medication Sig   amLODipine (NORVASC) 10 MG tablet  Take 10 mg by mouth daily.   apixaban (ELIQUIS) 5 MG TABS tablet Take 1 tablet by mouth 2 times a day. Need to schedule appt with cardiologist for refills.   atorvastatin (LIPITOR) 40 MG tablet Take 40 mg by mouth daily.   cholecalciferol (VITAMIN D3) 10 MCG (400 UNIT) TABS tablet Take 400 Units by mouth.   ferrous sulfate 325 (65 FE) MG tablet Take 325 mg by mouth daily with breakfast.   pantoprazole (PROTONIX) 40 MG tablet Take 1 tablet (40 mg total) by mouth daily at 6 (six) AM.   potassium chloride SA (K-DUR,KLOR-CON) 20 MEQ tablet Take 40 mEq by mouth 2 (two) times daily. Morning and evening   triamterene-hydrochlorothiazide (MAXZIDE) 75-50 MG per tablet Take 0.5 tablets by mouth every evening.    No facility-administered encounter medications on file as of 11/02/2022.    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 12/2019 Cardiac CT: 3 vessel cor Ca2+. Ca2+ = 144 (66th %i'le); b. 12/2019 MV: EF 60%, No ischemia/infarct. Ex time 7:30, Max HR 139.   Heart murmur    a. 09/2015 Echo: EF 55-60%, no rwma, GrI DD, mildly dil LA/RA.   High cholesterol    History of GIB (gastrointestinal bleeding)    a. Angiodysplasia of intestine several years  ago.   Hypertension    PAF (paroxysmal atrial fibrillation) ([place]) 07/17/2014   a. CHA2DS2VASc = 3-->eliquis.   Pneumonia    Thoracic ascending aortic aneurysm ([place])    a. 09/2020 CT Chest: Stable aneurysmal dilatation of the ascending thoracic aorta measuring 4.3 cm.   Past Surgical History:  Procedure Laterality Date   left wrist surgery Left 1966   glass cut ligaments, surgical repair   [place]   Family History  Problem Relation Age of Onset   Thyroid nodules Mother    Dementia Mother    Heart failure Father    Cancer Father    Kidney disease Son     Diabetes Brother    Cancer Brother    Heart attack Neg Hx    Stroke Neg Hx    Social History   Socioeconomic History   Marital status: Married    Spouse name: Not on file   Number of children: Not on file   Years of education: Not on file   Highest education level: Not on file  Occupational History   Occupation: retired  Tobacco Use   Smoking status: Every Day    Types: Cigars   Smokeless tobacco: Never   Tobacco comments:    Started at age 46 - 1ppdx 74, quit x 5; pipex5 years, cigarsx15  Vaping Use   Vaping Use: Never used  Substance and Sexual Activity   Alcohol use: Yes    Comment: 1.5 oz every three to four months   Drug use: Not Currently   Sexual activity: Yes  Other Topics Concern   Not on file  Social History Narrative   Lives locally with wife.  2 grown children.  Retired Data processing manager. Exercises regularly @ [employer].   Social Determinants of Health   Financial Resource Strain: Low Risk  (11/02/2022)   Overall Financial Resource Strain (CARDIA)    Difficulty of Paying Living Expenses: Not hard at all  Food Insecurity: No Food Insecurity (11/02/2022)   Hunger Vital Sign    Worried About Running Out of Food in the Last Year: Never true    Ran Out of Food in the Last Year: Never true  Transportation Needs: No Transportation Needs (11/02/2022)   PRAPARE - Hydrologist (Medical): No    Lack of Transportation (Non-Medical): No  Physical Activity: Inactive (11/02/2022)   Exercise Vital Sign    Days of Exercise per Week: 0 days    Minutes of Exercise per Session: 0 min  Stress: No Stress Concern Present (11/02/2022)   [place]    Feeling of Stress : Not at all  Social Connections: Not on file    Tobacco Counseling Ready to quit: Not Answered Counseling given: Not Answered Tobacco comments: Started at age 78 - 1ppdx 20, quit x 5; pipex5  years, cigarsx15   Clinical Intake:  Pre-visit preparation completed: Yes  Pain : 0-10 Pain Score: 8  Pain Type: Chronic pain Pain Location: Back Pain Orientation: Lower Pain Descriptors / Indicators: Aching Pain Onset: More than a month ago Pain Frequency: Intermittent     Nutritional Status: BMI > 30  Obese Nutritional Risks: None Diabetes: No  How often do you need to have someone help you when you read instructions, pamphlets, or other written materials from your doctor or pharmacy?: 1 - Never  Diabetic? no  Interpreter Needed?: No  Information entered by :: NAllen LPN   Activities of Daily Living    11/02/2022    9:04 AM  In your present state of health, do you have any difficulty performing the following activities:  Hearing? 0  Vision? 0  Difficulty concentrating or making decisions? 0  Walking or climbing stairs? 0  Dressing or bathing? 0  Doing errands, shopping? 0  Preparing Food and eating ? N  Using the Toilet? N  In the past six months, have you accidently leaked urine? N  Do you have problems with loss of bowel control? N  Managing your Medications? N  Managing your Finances? N  Housekeeping or managing your Housekeeping? N    Patient Care Team: Glendale Chard, MD as PCP - General (Internal Medicine) Sueanne Margarita, MD as PCP - Cardiology (Cardiology)  Indicate any recent Medical Services you may have received from other than [facility] providers in the past year (date may be approximate).     Assessment:   This is a routine wellness examination for [place].  Hearing/Vision screen Vision Screening - Comments:: [facility]  Dietary issues and exercise activities discussed: Current Exercise Habits: The patient does not participate in regular exercise at present   Goals Addressed             This Visit's Progress    Patient Stated       11/02/2022, no goals       Depression Screen    11/02/2022    9:04 AM 10/31/2022    9:01  AM 10/26/2021    8:32 AM 10/20/2020    8:49 AM 01/28/2020    9:13 AM 05/01/2019    8:53 AM 01/16/2019    3:02 PM  PHQ 2/9 Scores  PHQ - 2 Score 0 0 0 0 0 0 0  PHQ- 9 Score     0  0    Fall Risk    11/02/2022    9:04 AM 10/31/2022    9:01 AM 10/26/2021    8:31 AM 10/20/2020    8:48 AM 01/28/2020    9:12 AM  Fall Risk   Falls in the past year? 0 0 0 0 1  Comment     lost balance  Number falls in past yr: 0 0   0  Injury with Fall? 0 0   0  Risk for fall due to : No Fall Risks;Medication side effect No Fall Risks Medication side effect Medication side effect Medication side effect  Follow up Falls prevention discussed;Education provided;Falls evaluation completed Falls evaluation completed Falls evaluation completed;Education provided;Falls prevention discussed Falls evaluation completed;Education provided;Falls prevention discussed Falls evaluation completed;Education provided;Falls prevention discussed    FALL RISK PREVENTION PERTAINING TO THE HOME:  Any stairs in or around the home? No  If so, are there any without handrails? N/a Home free of loose throw rugs in walkways, pet beds, electrical cords, etc? Yes  Adequate lighting in your home to reduce risk of falls? Yes   ASSISTIVE DEVICES UTILIZED TO PREVENT FALLS:  Life alert? No  Use of a cane, walker or w/c? No  Grab bars in the bathroom? Yes  Shower chair or bench in shower? Yes  Elevated toilet seat or a handicapped toilet? No   TIMED UP AND GO:  Was the test performed? No .      Cognitive Function:        11/02/2022    9:05 AM 10/26/2021    8:34 AM 10/20/2020  8:50 AM 01/28/2020    9:17 AM 01/16/2019    3:05 PM  6CIT Screen  What Year? 0 points 0 points 0 points 0 points 0 points  What month? 0 points 0 points 0 points 0 points 0 points  What time? 0 points 0 points 0 points 0 points 3 points  Count back from 20 0 points 0 points 0 points 0 points 0 points  Months in reverse 0 points 0 points 0 points 0 points 0 points   Repeat phrase 0 points 2 points 4 points 0 points 0 points  Total Score 0 points 2 points 4 points 0 points 3 points    Immunizations Immunization History  Administered Date(s) Administered   COVID-19, mRNA, vaccine(Comirnaty)12 years and older 05/30/2022   Fluad Quad(high Dose 65+) 06/02/2020, 05/10/2021, 05/09/2022   Influenza, High Dose Seasonal PF 06/01/2016, 05/10/2017, 06/04/2018, 05/01/2019   Influenza, Seasonal, Injecte, Preservative Fre 05/08/2012, 06/18/2014   Influenza-Unspecified 07/07/2002, 07/12/2004, 07/22/2008, 09/20/2009, 05/17/2010, 05/11/2011, 05/22/2015, 06/21/2018, 04/22/2019, 05/10/2021, 05/09/2022   PFIZER Comirnaty(Gray Top)Covid-19 Tri-Sucrose Vaccine 12/15/2020   PFIZER(Purple Top)SARS-COV-2 Vaccination 09/21/2019, 10/12/2019, 05/28/2020   Pfizer Covid-19 Vaccine Bivalent Booster 71yr & up 05/31/2021   Pneumococcal Conjugate-13 11/07/2013, 05/22/2015   Pneumococcal Polysaccharide-23 01/01/2015, 01/31/2017   Pneumococcal-Unspecified 07/22/2008   Tdap 07/25/2010, 10/24/2011, 08/21/2012   Zoster Recombinat (Shingrix) 06/14/2018, 09/16/2018    TDAP status: Due, Education has been provided regarding the importance of this vaccine. Advised may receive this vaccine at local pharmacy or Health Dept. Aware to provide a copy of the vaccination record if obtained from local pharmacy or Health Dept. Verbalized acceptance and understanding.  Flu Vaccine status: Up to date  Pneumococcal vaccine status: Up to date  Covid-19 vaccine status: Completed vaccines  Qualifies for Shingles Vaccine? Yes   Zostavax completed Yes   Shingrix Completed?: Yes  Screening Tests Health Maintenance  Topic Date Due   COLONOSCOPY (Pts [phone] coverage will need to be confirmed)  04/30/2018   DTaP/Tdap/Td (4 - Td or Tdap) 08/21/2022   Medicare Annual Wellness (AWV)  10/27/2022   Pneumonia Vaccine 6553Years old  Completed   INFLUENZA VACCINE  Completed   COVID-19 Vaccine   Completed   Hepatitis C Screening  Completed   Zoster Vaccines- Shingrix  Completed   HPV VACCINES  Aged Out    Health Maintenance  Health Maintenance Due  Topic Date Due   COLONOSCOPY (Pts [id number] coverage will need to be confirmed)  04/30/2018   DTaP/Tdap/Td (4 - Td or Tdap) 08/21/2022   Medicare Annual Wellness (AWV)  10/27/2022    Colorectal cancer screening: due   Lung Cancer Screening: (Low Dose CT Chest recommended if Age 48-50-80ars, 30 pack-year currently smoking OR have quit w/in 15years.) does not qualify.   Lung Cancer Screening Referral: no  Additional Screening:  Hepatitis C Screening: does qualify; Completed 09/02/2012  Vision Screening: Recommended annual ophthalmology exams for early detection of glaucoma and other disorders of the eye. Is the patient up to date with their annual eye exam?  Yes  Who is the provider or what is the name of the office in which the patient attends [facility] exams[facility] If pt is not established with a provider, would they like to be referred to a provider to establish care? No .   Dental Screening: Recommended annual dental exams for proper oral hygiene  Community Resource Referral / Chronic Care Management: CRR required this visit?  No   CCM required this visit?  No      Plan:     I have personally reviewed and noted the following in the patient's chart:   Medical and social history Use of alcohol, tobacco or illicit drugs  Current medications and supplements including opioid prescriptions. Patient is not currently taking opioid prescriptions. Functional ability and status Nutritional status Physical activity Advanced directives List of other physicians Hospitalizations, surgeries, and ER visits in previous 12 months Vitals Screenings to include cognitive, depression, and falls Referrals and appointments  In addition, I have reviewed and discussed with patient certain preventive protocols, quality  metrics, and best practice recommendations. A written personalized care plan for preventive services as well as general preventive health recommendations were provided to patient.     Kellie Simmering, LPN   [id number]   Nurse Notes: none  Due to this being a virtual visit, the after visit summary with patients personalized plan was offered to patient via mail or my-chart.  to pick up at office at next visit

## 2022-11-02 NOTE — Patient Instructions (Signed)
[name] , Thank you for taking time to come for your Medicare Wellness Visit. I appreciate your ongoing commitment to your health goals. Please review the following plan we discussed and let me know if I can assist you in the future.   These are the goals we discussed:  Goals      Patient Stated     01/28/2020, no goals     Patient Stated     10/20/2020, no goals     Patient Stated     10/26/2021, no goals     Patient Stated     11/02/2022, no goals     Quit Smoking        This is a list of the screening recommended for you and due dates:  Health Maintenance  Topic Date Due   Colon Cancer Screening  04/30/2018   DTaP/Tdap/Td vaccine (4 - Td or Tdap) 08/21/2022   Medicare Annual Wellness Visit  11/02/2023   Pneumonia Vaccine  Completed   Flu Shot  Completed   COVID-19 Vaccine  Completed   Hepatitis C Screening: USPSTF Recommendation to screen - Ages 18-79 yo.  Completed   Zoster (Shingles) Vaccine  Completed   HPV Vaccine  Aged Out    Advanced directives: copy in chart  Conditions/risks identified: none  Next appointment: Follow up in one year for your annual wellness visit.   Preventive Care 24 Years and Older, Male  Preventive care refers to lifestyle choices and visits with your health care provider that can promote health and wellness. What does preventive care include? A yearly physical exam. This is also called an annual well check. Dental exams once or twice a year. Routine eye exams. Ask your health care provider how often you should have your eyes checked. Personal lifestyle choices, including: Daily care of your teeth and gums. Regular physical activity. Eating a healthy diet. Avoiding tobacco and drug use. Limiting alcohol use. Practicing safe sex. Taking low doses of aspirin every day. Taking vitamin and mineral supplements as recommended by your health care provider. What happens during an annual well check? The services and screenings done by your  health care provider during your annual well check will depend on your age, overall health, lifestyle risk factors, and family history of disease. Counseling  Your health care provider may ask you questions about your: Alcohol use. Tobacco use. Drug use. Emotional well-being. Home and relationship well-being. Sexual activity. Eating habits. History of falls. Memory and ability to understand (cognition). Work and work Statistician. Screening  You may have the following tests or measurements: Height, weight, and BMI. Blood pressure. Lipid and cholesterol levels. These may be checked every 5 years, or more frequently if you are over 51 years old. Skin check. Lung cancer screening. You may have this screening every year starting at age 61 if you have a 30-pack-year history of smoking and currently smoke or have quit within the past 15 years. Fecal occult blood test (FOBT) of the stool. You may have this test every year starting at age 63. Flexible sigmoidoscopy or colonoscopy. You may have a sigmoidoscopy every 5 years or a colonoscopy every 10 years starting at age 42. Prostate cancer screening. Recommendations will vary depending on your family history and other risks. Hepatitis C blood test. Hepatitis B blood test. Sexually transmitted disease (STD) testing. Diabetes screening. This is done by checking your blood sugar (glucose) after you have not eaten for a while (fasting). You may have this done every 1-3 years.  Abdominal aortic aneurysm (AAA) screening. You may need this if you are a current or former smoker. Osteoporosis. You may be screened starting at age 75 if you are at high risk. Talk with your health care provider about your test results, treatment options, and if necessary, the need for more tests. Vaccines  Your health care provider may recommend certain vaccines, such as: Influenza vaccine. This is recommended every year. Tetanus, diphtheria, and acellular pertussis  (Tdap, Td) vaccine. You may need a Td booster every 10 years. Zoster vaccine. You may need this after age 64. Pneumococcal 13-valent conjugate (PCV13) vaccine. One dose is recommended after age 45. Pneumococcal polysaccharide (PPSV23) vaccine. One dose is recommended after age 70. Talk to your health care provider about which screenings and vaccines you need and how often you need them. This information is not intended to replace advice given to you by your health care provider. Make sure you discuss any questions you have with your health care provider. Document Released: 09/03/2015 Document Revised: 04/26/2016 Document Reviewed: 06/08/2015 Elsevier Interactive Patient Education  2017 [employer] Prevention in the Home Falls can cause injuries. They can happen to people of all ages. There are many things you can do to make your home safe and to help prevent falls. What can I do on the outside of my home? Regularly fix the edges of walkways and driveways and fix any cracks. Remove anything that might make you trip as you walk through a door, such as a raised step or threshold. Trim any bushes or trees on the path to your home. Use bright outdoor lighting. Clear any walking paths of anything that might make someone trip, such as rocks or tools. Regularly check to see if handrails are loose or broken. Make sure that both sides of any steps have handrails. Any raised decks and porches should have guardrails on the edges. Have any leaves, snow, or ice cleared regularly. Use sand or salt on walking paths during winter. Clean up any spills in your garage right away. This includes oil or grease spills. What can I do in the bathroom? Use night lights. Install grab bars by the toilet and in the tub and shower. Do not use towel bars as grab bars. Use non-skid mats or decals in the tub or shower. If you need to sit down in the shower, use a plastic, non-slip stool. Keep the floor dry. Clean  up any water that spills on the floor as soon as it happens. Remove soap buildup in the tub or shower regularly. Attach bath mats securely with double-sided non-slip rug tape. Do not have throw rugs and other things on the floor that can make you trip. What can I do in the bedroom? Use night lights. Make sure that you have a light by your bed that is easy to reach. Do not use any sheets or blankets that are too big for your bed. They should not hang down onto the floor. Have a firm chair that has side arms. You can use this for support while you get dressed. Do not have throw rugs and other things on the floor that can make you trip. What can I do in the kitchen? Clean up any spills right away. Avoid walking on wet floors. Keep items that you use a lot in easy-to-reach places. If you need to reach something above you, use a strong step stool that has a grab bar. Keep electrical cords out of the way. Do not  use floor polish or wax that makes floors slippery. If you must use wax, use non-skid floor wax. Do not have throw rugs and other things on the floor that can make you trip. What can I do with my stairs? Do not leave any items on the stairs. Make sure that there are handrails on both sides of the stairs and use them. Fix handrails that are broken or loose. Make sure that handrails are as long as the stairways. Check any carpeting to make sure that it is firmly attached to the stairs. Fix any carpet that is loose or worn. Avoid having throw rugs at the top or bottom of the stairs. If you do have throw rugs, attach them to the floor with carpet tape. Make sure that you have a light switch at the top of the stairs and the bottom of the stairs. If you do not have them, ask someone to add them for you. What else can I do to help prevent falls? Wear shoes that: Do not have high heels. Have rubber bottoms. Are comfortable and fit you well. Are closed at the toe. Do not wear sandals. If you  use a stepladder: Make sure that it is fully opened. Do not climb a closed stepladder. Make sure that both sides of the stepladder are locked into place. Ask someone to hold it for you, if possible. Clearly mark and make sure that you can see: Any grab bars or handrails. First and last steps. Where the edge of each step is. Use tools that help you move around (mobility aids) if they are needed. These include: Canes. Walkers. Scooters. Crutches. Turn on the lights when you go into a dark area. Replace any light bulbs as soon as they burn out. Set up your furniture so you have a clear path. Avoid moving your furniture around. If any of your floors are uneven, fix them. If there are any pets around you, be aware of where they are. Review your medicines with your doctor. Some medicines can make you feel dizzy. This can increase your chance of falling. Ask your doctor what other things that you can do to help prevent falls. This information is not intended to replace advice given to you by your health care provider. Make sure you discuss any questions you have with your health care provider. Document Released: 06/03/2009 Document Revised: 01/13/2016 Document Reviewed: 09/11/2014 Elsevier Interactive Patient Education  2017 [employer].

## 2022-11-07 HISTORY — PX: PARTIAL NEPHRECTOMY: SHX414

## 2023-03-22 ENCOUNTER — Ambulatory Visit (INDEPENDENT_AMBULATORY_CARE_PROVIDER_SITE_OTHER): Payer: Medicare PPO | Admitting: Internal Medicine

## 2023-03-22 ENCOUNTER — Encounter: Payer: Self-pay | Admitting: Internal Medicine

## 2023-03-22 VITALS — BP 118/80 | HR 81 | Temp 97.7°F | Ht 70.0 in | Wt 215.2 lb

## 2023-03-22 DIAGNOSIS — D6869 Other thrombophilia: Secondary | ICD-10-CM

## 2023-03-22 DIAGNOSIS — E7800 Pure hypercholesterolemia, unspecified: Secondary | ICD-10-CM | POA: Diagnosis not present

## 2023-03-22 DIAGNOSIS — I480 Paroxysmal atrial fibrillation: Secondary | ICD-10-CM | POA: Diagnosis not present

## 2023-03-22 DIAGNOSIS — I700 Atherosclerosis of aorta: Secondary | ICD-10-CM

## 2023-03-22 DIAGNOSIS — F17200 Nicotine dependence, unspecified, uncomplicated: Secondary | ICD-10-CM

## 2023-03-22 DIAGNOSIS — I1310 Hypertensive heart and chronic kidney disease without heart failure, with stage 1 through stage 4 chronic kidney disease, or unspecified chronic kidney disease: Secondary | ICD-10-CM | POA: Diagnosis not present

## 2023-03-22 DIAGNOSIS — Z85528 Personal history of other malignant neoplasm of kidney: Secondary | ICD-10-CM

## 2023-03-22 DIAGNOSIS — N1831 Chronic kidney disease, stage 3a: Secondary | ICD-10-CM

## 2023-03-22 DIAGNOSIS — B360 Pityriasis versicolor: Secondary | ICD-10-CM | POA: Diagnosis not present

## 2023-03-22 MED ORDER — SELENIUM SULFIDE 2.5 % EX LOTN: TOPICAL_LOTION | Freq: Every day | CUTANEOUS | 1 refills | Status: AC | PRN

## 2023-03-22 NOTE — Progress Notes (Signed)
I,Victoria T Deloria Lair, CMA,acting as a Neurosurgeon for Gwynneth Aliment, MD.,have documented all relevant documentation on the behalf of Gwynneth Aliment, MD,as directed by  Gwynneth Aliment, MD while in the presence of Gwynneth Aliment, MD.  Subjective:  Patient ID: Richard Sampson , male    DOB: Mar 29, 1946 , 77 y.o.   MRN: [id number]  Chief Complaint  Patient presents with   Hypertension   Hyperlipidemia    HPI  He is here today for BP and Chol check. He reports compliance with meds.  He denies having any headaches, chest pain and shortness of breath.   He adds having rash along with bump, dryness & itchiness on both of his inner arms. He admits only having this issue in the summer. He reports seeing a dermatologist at the [place] in the past, he has not visited them in a while.   He reports completing colonoscopy, he does not remember name of specialist.       Hypertension This is a chronic problem. The current episode started more than 1 year ago. The problem has been gradually improving since onset. The problem is controlled. Pertinent negatives include no blurred vision, chest pain, palpitations or shortness of breath. The current treatment provides moderate improvement. Compliance problems include exercise.   Hyperlipidemia This is a chronic problem. The current episode started more than 1 year ago. The problem is controlled. Recent lipid tests were reviewed and are normal. Exacerbating diseases include obesity. He has no history of diabetes or hypothyroidism. Pertinent negatives include no chest pain or shortness of breath. Current antihyperlipidemic treatment includes statins. There are no compliance problems.      Past Medical History:  Diagnosis Date   Agatston coronary artery calcium score between 100 and 199    a. 12/2019 Cardiac CT: 3 vessel cor Ca2+. Ca2+ = 144 (66th %i'le); b. 12/2019 MV: EF 60%, No ischemia/infarct. Ex time 7:30, Max HR 139.   Heart murmur    a. 09/2015 Echo: EF  55-60%, no rwma, GrI DD, mildly dil LA/RA.   High cholesterol    History of GIB (gastrointestinal bleeding)    a. Angiodysplasia of intestine several years ago.   Hypertension    PAF (paroxysmal atrial fibrillation) (HCC) 07/17/2014   a. CHA2DS2VASc = 3-->eliquis.   Pneumonia    Thoracic ascending aortic aneurysm (HCC)    a. 09/2020 CT Chest: Stable aneurysmal dilatation of the ascending thoracic aorta measuring 4.3 cm.     Family History  Problem Relation Age of Onset   Thyroid nodules Mother    Dementia Mother    Heart failure Father    Cancer Father    Kidney disease Son    Diabetes Brother    Cancer Brother    Heart attack Neg Hx    Stroke Neg Hx      Current Outpatient Medications:    amLODipine (NORVASC) 10 MG tablet, Take 10 mg by mouth daily., Disp: , Rfl:    apixaban (ELIQUIS) 5 MG TABS tablet, Take 1 tablet by mouth 2 times a day. Need to schedule appt with cardiologist for refills., Disp: 60 tablet, Rfl: 0   atorvastatin (LIPITOR) 40 MG tablet, Take 40 mg by mouth daily., Disp: , Rfl:    cholecalciferol (VITAMIN D3) 10 MCG (400 UNIT) TABS tablet, Take 400 Units by mouth., Disp: , Rfl:    ferrous sulfate 325 (65 FE) MG tablet, Take 325 mg by mouth daily with breakfast., Disp: , Rfl:  pantoprazole (PROTONIX) 40 MG tablet, Take 1 tablet (40 mg total) by mouth daily at 6 (six) AM., Disp: 90 tablet, Rfl: 2   potassium chloride SA (K-DUR,KLOR-CON) 20 MEQ tablet, Take 40 mEq by mouth 2 (two) times daily. Morning and evening, Disp: , Rfl:    camphor-menthol (SARNA) lotion, Apply topically., Disp: , Rfl:    selenium sulfide (SELSUN) 2.5 % lotion, Apply topically daily as needed for irritation. Apply topically., Disp: 118 mL, Rfl: 1   triamterene-hydrochlorothiazide (MAXZIDE) 75-50 MG per tablet, Take 0.5 tablets by mouth every evening. , Disp: , Rfl:    Allergies  Allergen Reactions   Ace Inhibitors Swelling    Throat swelling; pt was on lisinopril   Angiotensin Receptor  Blockers Swelling    Cross reactivity in known ACE I allergy   Lisinopril Swelling    Throat swelling   Losartan Other (See Comments)    Doctor told pt not to take     Review of Systems  Constitutional: Negative.   HENT: Negative.    Eyes:  Negative for blurred vision.  Respiratory: Negative.  Negative for shortness of breath.   Cardiovascular: Negative.  Negative for chest pain and palpitations.  Skin: Negative.   Allergic/Immunologic: Negative.   Neurological: Negative.   Hematological: Negative.      Today's Vitals   03/22/23 1012  BP: 118/80  Pulse: 81  Temp: 97.7 F (36.5 C)  SpO2: 98%  Weight: 215 lb 3.2 oz (97.6 kg)  Height: 5\' 10"  (1.778 m)   Body mass index is 30.88 kg/m.  Wt Readings from Last 3 Encounters:  03/22/23 215 lb 3.2 oz (97.6 kg)  11/02/22 224 lb (101.6 kg)  10/31/22 224 lb (101.6 kg)     Objective:  Physical Exam Vitals and nursing note reviewed.  Constitutional:      Appearance: Normal appearance.  HENT:     Head: Normocephalic and atraumatic.  Eyes:     Extraocular Movements: Extraocular movements intact.  Cardiovascular:     Rate and Rhythm: Normal rate. Rhythm irregular.     Heart sounds: Normal heart sounds.  Pulmonary:     Effort: Pulmonary effort is normal.     Breath sounds: Normal breath sounds.  Skin:    General: Skin is warm.  Neurological:     General: No focal deficit present.     Mental Status: He is alert.  Psychiatric:        Mood and Affect: Mood normal.         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 He will continue with Maxzide 75/50 1/2 tab daily and amlodipine 10mg  daily. He is reminded to follow a heart healthy diet.    Atherosclerosis of aorta [facility]) Assessment & Plan: Chronic, encouraged to comply with statin therapy and follow heart  healthy diet. He will continue with atorvastatin 40mg  daily.    Paroxysmal atrial fibrillation (HCC) Assessment & Plan: Chronic, he is rate controlled and properly anticoagulated.    Chronic kidney disease, stage 3a (HCC) Assessment & Plan: Chronic, he is encouraged to stay well hydrated, avoid NSAIDs and keep BP controlled to prevent progression of CKD.     Acquired thrombophilia (HCC) Assessment & Plan: He is currently on Eliquis due to underlying PAF.     Pure hypercholesterolemia Assessment & Plan: Chronic, he will continue with atorvastatin 40mg  daily.  He is encouraged to follow a heart healthy lifestyle.    Tinea versicolor Assessment & Plan: He is advised to use selenium sulfide lotion prn. He will let me know if his sx persist.    Tobacco use disorder Assessment & Plan: He agrees to LDCT. He is no longer smoking cigarettes, he has switched to cigars.   Orders: -     CT CHEST LUNG CANCER SCREENING LOW DOSE WO CONTRAST; Future  History of renal cell carcinoma  Other orders -     Selenium Sulfide; Apply topically daily as needed for irritation. Apply topically.  Dispense: 118 mL; Refill: 1     Return if symptoms worsen or fail to improve.  Patient was given opportunity to ask questions. Patient verbalized understanding of the plan and was able to repeat key elements of the plan. All questions were answered to their satisfaction.   I, Gwynneth Aliment, MD, have reviewed all documentation for this visit. The documentation on 03/22/23 for the exam, diagnosis, procedures, and orders are all accurate and complete.   IF YOU HAVE BEEN REFERRED TO A SPECIALIST, IT MAY TAKE 1-2 WEEKS TO SCHEDULE/PROCESS THE REFERRAL. IF YOU HAVE NOT HEARD FROM US/SPECIALIST IN TWO WEEKS, PLEASE GIVE [place] A CALL AT [phone].   THE PATIENT IS ENCOURAGED TO PRACTICE SOCIAL DISTANCING DUE TO THE COVID-19 PANDEMIC.

## 2023-03-22 NOTE — Patient Instructions (Signed)
Hypertension, Adult Hypertension is another name for high blood pressure. High blood pressure forces your heart to work harder to pump blood. This can cause problems over time. There are two numbers in a blood pressure reading. There is a top number (systolic) over a bottom number (diastolic). It is best to have a blood pressure that is below 120/80. What are the causes? The cause of this condition is not known. Some other conditions can lead to high blood pressure. What increases the risk? Some lifestyle factors can make you more likely to develop high blood pressure: Smoking. Not getting enough exercise or physical activity. Being overweight. Having too much fat, sugar, calories, or salt (sodium) in your diet. Drinking too much alcohol. Other risk factors include: Having any of these conditions: Heart disease. Diabetes. High cholesterol. Kidney disease. Obstructive sleep apnea. Having a family history of high blood pressure and high cholesterol. Age. The risk increases with age. Stress. What are the signs or symptoms? High blood pressure may not cause symptoms. Very high blood pressure (hypertensive crisis) may cause: Headache. Fast or uneven heartbeats (palpitations). Shortness of breath. Nosebleed. Vomiting or feeling like you may vomit (nauseous). Changes in how you see. Very bad chest pain. Feeling dizzy. Seizures. How is this treated? This condition is treated by making healthy lifestyle changes, such as: Eating healthy foods. Exercising more. Drinking less alcohol. Your doctor may prescribe medicine if lifestyle changes do not help enough and if: Your top number is above 130. Your bottom number is above 80. Your personal target blood pressure may vary. Follow these instructions at home: Eating and drinking  If told, follow the DASH eating plan. To follow this plan: Fill one half of your plate at each meal with fruits and vegetables. Fill one fourth of your plate  at each meal with whole grains. Whole grains include whole-wheat pasta, brown rice, and whole-grain bread. Eat or drink low-fat dairy products, such as skim milk or low-fat yogurt. Fill one fourth of your plate at each meal with low-fat (lean) proteins. Low-fat proteins include fish, chicken without skin, eggs, beans, and tofu. Avoid fatty meat, cured and processed meat, or chicken with skin. Avoid pre-made or processed food. Limit the amount of salt in your diet to less than 1,500 mg each day. Do not drink alcohol if: Your doctor tells you not to drink. You are pregnant, may be pregnant, or are planning to become pregnant. If you drink alcohol: Limit how much you have to: 0-1 drink a day for women. 0-2 drinks a day for men. Know how much alcohol is in your drink. In the [place] one drink equals one 12 oz bottle of beer (355 mL), one 5 oz glass of wine (148 mL), or one 1 oz glass of hard liquor (44 mL). Lifestyle  Work with your doctor to stay at a healthy weight or to lose weight. Ask your doctor what the best weight is for you. Get at least 30 minutes of exercise that causes your heart to beat faster (aerobic exercise) most days of the week. This may include walking, swimming, or biking. Get at least 30 minutes of exercise that strengthens your muscles (resistance exercise) at least 3 days a week. This may include lifting weights or doing Pilates. Do not smoke or use any products that contain nicotine or tobacco. If you need help quitting, ask your doctor. Check your blood pressure at home as told by your doctor. Keep all follow-up visits. Medicines Take over-the-counter and prescription medicines   only as told by your doctor. Follow directions carefully. Do not skip doses of blood pressure medicine. The medicine does not work as well if you skip doses. Skipping doses also puts you at risk for problems. Ask your doctor about side effects or reactions to medicines that you should watch  for. Contact a doctor if: You think you are having a reaction to the medicine you are taking. You have headaches that keep coming back. You feel dizzy. You have swelling in your ankles. You have trouble with your vision. Get help right away if: You get a very bad headache. You start to feel mixed up (confused). You feel weak or numb. You feel faint. You have very bad pain in your: Chest. Belly (abdomen). You vomit more than once. You have trouble breathing. These symptoms may be an emergency. Get help right away. Call 911. Do not wait to see if the symptoms will go away. Do not drive yourself to the hospital. Summary Hypertension is another name for high blood pressure. High blood pressure forces your heart to work harder to pump blood. For most people, a normal blood pressure is less than 120/80. Making healthy choices can help lower blood pressure. If your blood pressure does not get lower with healthy choices, you may need to take medicine. This information is not intended to replace advice given to you by your health care provider. Make sure you discuss any questions you have with your health care provider. Document Revised: 05/26/2021 Document Reviewed: 05/26/2021 Elsevier Patient Education  2024 [employer]  

## 2023-03-26 ENCOUNTER — Encounter: Payer: Self-pay | Admitting: Internal Medicine

## 2023-03-26 DIAGNOSIS — B360 Pityriasis versicolor: Secondary | ICD-10-CM | POA: Insufficient documentation

## 2023-03-26 NOTE — Assessment & Plan Note (Signed)
He is advised to use selenium sulfide lotion prn. He will let me know if his sx persist.

## 2023-03-26 NOTE — Assessment & Plan Note (Signed)
Chronic, he is encouraged to stay well hydrated, avoid NSAIDs and keep BP controlled to prevent progression of CKD.

## 2023-03-26 NOTE — Assessment & Plan Note (Signed)
Chronic, encouraged to comply with statin therapy and follow heart healthy diet. He will continue with atorvastatin 40mg  daily.

## 2023-03-26 NOTE — Assessment & Plan Note (Addendum)
He agrees to LDCT. He is no longer smoking cigarettes, he has switched to cigars.

## 2023-03-26 NOTE — Assessment & Plan Note (Signed)
He is currently on Eliquis due to underlying PAF.

## 2023-03-26 NOTE — Assessment & Plan Note (Signed)
Chronic, well controlled. He will continue with Maxzide 75/50 1/2 tab daily and amlodipine 10mg  daily. He is reminded to follow a heart healthy diet.

## 2023-03-26 NOTE — Assessment & Plan Note (Signed)
Chronic, he will continue with atorvastatin 40mg  daily. He is encouraged to follow a heart healthy lifestyle.

## 2023-03-26 NOTE — Assessment & Plan Note (Signed)
Chronic, he is rate controlled and properly anticoagulated.

## 2023-04-02 ENCOUNTER — Ambulatory Visit (HOSPITAL_BASED_OUTPATIENT_CLINIC_OR_DEPARTMENT_OTHER)
Admission: RE | Admit: 2023-04-02 | Discharge: 2023-04-02 | Disposition: A | Discharge status: Home / Self Care | Payer: Medicare PPO | Source: Ambulatory Visit | Attending: Internal Medicine | Admitting: Internal Medicine

## 2023-04-02 DIAGNOSIS — I517 Cardiomegaly: Secondary | ICD-10-CM | POA: Insufficient documentation

## 2023-04-02 DIAGNOSIS — J219 Acute bronchiolitis, unspecified: Secondary | ICD-10-CM | POA: Insufficient documentation

## 2023-04-02 DIAGNOSIS — J438 Other emphysema: Secondary | ICD-10-CM | POA: Insufficient documentation

## 2023-04-02 DIAGNOSIS — Z122 Encounter for screening for malignant neoplasm of respiratory organs: Secondary | ICD-10-CM | POA: Insufficient documentation

## 2023-04-02 DIAGNOSIS — I2510 Atherosclerotic heart disease of native coronary artery without angina pectoris: Secondary | ICD-10-CM | POA: Diagnosis not present

## 2023-04-02 DIAGNOSIS — I7121 Aneurysm of the ascending aorta, without rupture: Secondary | ICD-10-CM | POA: Insufficient documentation

## 2023-04-02 DIAGNOSIS — R918 Other nonspecific abnormal finding of lung field: Secondary | ICD-10-CM | POA: Insufficient documentation

## 2023-04-02 DIAGNOSIS — J432 Centrilobular emphysema: Secondary | ICD-10-CM | POA: Insufficient documentation

## 2023-04-02 DIAGNOSIS — I700 Atherosclerosis of aorta: Secondary | ICD-10-CM | POA: Diagnosis not present

## 2023-04-02 DIAGNOSIS — F17200 Nicotine dependence, unspecified, uncomplicated: Secondary | ICD-10-CM | POA: Insufficient documentation

## 2023-04-02 DIAGNOSIS — K8020 Calculus of gallbladder without cholecystitis without obstruction: Secondary | ICD-10-CM | POA: Diagnosis not present

## 2023-05-31 ENCOUNTER — Encounter: Payer: Self-pay | Admitting: Internal Medicine

## 2023-05-31 ENCOUNTER — Ambulatory Visit (INDEPENDENT_AMBULATORY_CARE_PROVIDER_SITE_OTHER): Payer: Medicare PPO | Admitting: Internal Medicine

## 2023-05-31 VITALS — BP 124/84 | HR 87 | Temp 98.3°F | Ht 70.0 in | Wt 217.8 lb

## 2023-05-31 DIAGNOSIS — I1310 Hypertensive heart and chronic kidney disease without heart failure, with stage 1 through stage 4 chronic kidney disease, or unspecified chronic kidney disease: Secondary | ICD-10-CM | POA: Diagnosis not present

## 2023-05-31 DIAGNOSIS — N1831 Chronic kidney disease, stage 3a: Secondary | ICD-10-CM | POA: Diagnosis not present

## 2023-05-31 DIAGNOSIS — I480 Paroxysmal atrial fibrillation: Secondary | ICD-10-CM | POA: Diagnosis not present

## 2023-05-31 DIAGNOSIS — K5904 Chronic idiopathic constipation: Secondary | ICD-10-CM | POA: Diagnosis not present

## 2023-05-31 DIAGNOSIS — I700 Atherosclerosis of aorta: Secondary | ICD-10-CM | POA: Diagnosis not present

## 2023-05-31 DIAGNOSIS — Z23 Encounter for immunization: Secondary | ICD-10-CM

## 2023-05-31 DIAGNOSIS — D6869 Other thrombophilia: Secondary | ICD-10-CM | POA: Diagnosis not present

## 2023-05-31 DIAGNOSIS — E6609 Other obesity due to excess calories: Secondary | ICD-10-CM

## 2023-05-31 DIAGNOSIS — E7800 Pure hypercholesterolemia, unspecified: Secondary | ICD-10-CM

## 2023-05-31 DIAGNOSIS — E66811 Obesity, class 1: Secondary | ICD-10-CM | POA: Diagnosis not present

## 2023-05-31 DIAGNOSIS — Z0000 Encounter for general adult medical examination without abnormal findings: Secondary | ICD-10-CM | POA: Diagnosis not present

## 2023-05-31 DIAGNOSIS — Z6831 Body mass index (BMI) 31.0-31.9, adult: Secondary | ICD-10-CM

## 2023-05-31 LAB — POCT URINALYSIS DIPSTICK
Bilirubin, UA: NEGATIVE
Blood, UA: NEGATIVE
Glucose, UA: NEGATIVE
Ketones, UA: NEGATIVE
Leukocytes, UA: NEGATIVE
Nitrite, UA: NEGATIVE
Protein, UA: NEGATIVE
Spec Grav, UA: 1.025 (ref 1.010–1.025)
Urobilinogen, UA: 0.2 U/dL
pH, UA: 5.5 (ref 5.0–8.0)

## 2023-05-31 NOTE — Assessment & Plan Note (Signed)
Chronic, encouraged to comply with statin therapy and follow heart healthy diet. He will continue with atorvastatin 40mg  daily.

## 2023-05-31 NOTE — Progress Notes (Unsigned)
I,Victoria T Deloria Lair, CMA,acting as a Neurosurgeon for Gwynneth Aliment, MD.,have documented all relevant documentation on the behalf of Gwynneth Aliment, MD,as directed by  Gwynneth Aliment, MD while in the presence of Gwynneth Aliment, MD.  Subjective:   Patient ID: Richard Sampson , male    DOB: Jun 06, 1946 , 77 y.o.   MRN: [id number]  Chief Complaint  Patient presents with  . Annual Exam  . Hypertension  . Hyperlipidemia    HPI  He is here today for a full physical exam. He has no specific concerns or complaints at this time. He has his Urology exams performed at the [place]. He reports compliance with meds. He denies headaches, chest pain and shortness of breath.   Hypertension This is a chronic problem. The current episode started more than 1 year ago. The problem has been gradually improving since onset. The problem is controlled. Pertinent negatives include no blurred vision. Risk factors for coronary artery disease include dyslipidemia, obesity, male gender and sedentary lifestyle. Past treatments include calcium channel blockers and diuretics. The current treatment provides moderate improvement. Compliance problems include exercise.      Past Medical History:  Diagnosis Date  . Agatston coronary artery calcium score between 100 and 199    a. 12/2019 Cardiac CT: 3 vessel cor Ca2+. Ca2+ = 144 (66th %i'le); b. 12/2019 MV: EF 60%, No ischemia/infarct. Ex time 7:30, Max HR 139.  Marland Kitchen Heart murmur    a. 09/2015 Echo: EF 55-60%, no rwma, GrI DD, mildly dil LA/RA.  Marland Kitchen High cholesterol   . History of GIB (gastrointestinal bleeding)    a. Angiodysplasia of intestine several years ago.  Marland Kitchen Hypertension   . PAF (paroxysmal atrial fibrillation) (HCC) 07/17/2014   a. CHA2DS2VASc = 3-->eliquis.  . Pneumonia   . Thoracic ascending aortic aneurysm (HCC)    a. 09/2020 CT Chest: Stable aneurysmal dilatation of the ascending thoracic aorta measuring 4.3 cm.     Family History  Problem Relation Age of Onset  .  Thyroid nodules Mother   . Dementia Mother   . Heart failure Father   . Cancer Father   . Kidney disease Son   . Diabetes Brother   . Cancer Brother   . Heart attack Neg Hx   . Stroke Neg Hx      Current Outpatient Medications:  .  amLODipine (NORVASC) 10 MG tablet, Take 10 mg by mouth daily., Disp: , Rfl:  .  apixaban (ELIQUIS) 5 MG TABS tablet, Take 1 tablet by mouth 2 times a day. Need to schedule appt with cardiologist for refills., Disp: 60 tablet, Rfl: 0 .  atorvastatin (LIPITOR) 40 MG tablet, Take 40 mg by mouth daily., Disp: , Rfl:  .  camphor-menthol (SARNA) lotion, Apply topically., Disp: , Rfl:  .  cholecalciferol (VITAMIN D3) 10 MCG (400 UNIT) TABS tablet, Take 400 Units by mouth., Disp: , Rfl:  .  ferrous sulfate 325 (65 FE) MG tablet, Take 325 mg by mouth daily with breakfast., Disp: , Rfl:  .  pantoprazole (PROTONIX) 40 MG tablet, Take 1 tablet (40 mg total) by mouth daily at 6 (six) AM., Disp: 90 tablet, Rfl: 2 .  potassium chloride SA (K-DUR,KLOR-CON) 20 MEQ tablet, Take 40 mEq by mouth 2 (two) times daily. Morning and evening, Disp: , Rfl:  .  selenium sulfide (SELSUN) 2.5 % lotion, Apply topically daily as needed for irritation. Apply topically., Disp: 118 mL, Rfl: 1 .  simethicone (MYLICON) 80 MG chewable  tablet, Chew by mouth., Disp: , Rfl:  .  triamterene-hydrochlorothiazide (MAXZIDE) 75-50 MG per tablet, Take 0.5 tablets by mouth every evening. , Disp: , Rfl:    Allergies  Allergen Reactions  . Ace Inhibitors Swelling    Throat swelling; pt was on lisinopril  . Angiotensin Receptor Blockers Swelling    Cross reactivity in known ACE I allergy  . Lisinopril Swelling    Throat swelling  . Losartan Other (See Comments)    Doctor told pt not to take     Men's preventive visit. Patient Health Questionnaire (PHQ-2) is  Flowsheet Row Office Visit from 05/31/2023 in [facility]  PHQ-2 Total Score 0     . Patient is on a low  salt diet. Marital status: Married. Relevant history for alcohol use is:  Social History   Substance and Sexual Activity  Alcohol Use Yes   Comment: 1.5 oz every three to four months  . Relevant history for tobacco use is:  Social History   Tobacco Use  Smoking Status Every Day  . Types: Cigars  Smokeless Tobacco Never  Tobacco Comments   Started at age 38 - 1ppdx 6, quit x 5; pipex5 years, cigarsx15  .   Review of Systems  Constitutional: Negative.   HENT: Negative.    Eyes: Negative.  Negative for blurred vision.  Respiratory: Negative.    Cardiovascular: Negative.   Gastrointestinal: Negative.   Endocrine: Negative.   Genitourinary: Negative.   Musculoskeletal: Negative.   Skin: Negative.   Allergic/Immunologic: Negative.   Neurological: Negative.   Hematological: Negative.   Psychiatric/Behavioral: Negative.       Today's Vitals   05/31/23 1027  BP: 124/84  Pulse: 87  Temp: 98.3 F (36.8 C)  SpO2: 98%  Weight: 217 lb 12.8 oz (98.8 kg)  Height: 5\' 10"  (1.778 m)   Body mass index is 31.25 kg/m.  Wt Readings from Last 3 Encounters:  05/31/23 217 lb 12.8 oz (98.8 kg)  03/22/23 215 lb 3.2 oz (97.6 kg)  11/02/22 224 lb (101.6 kg)    Objective:  Physical Exam Vitals and nursing note reviewed.  Constitutional:      Appearance: Normal appearance.  HENT:     Head: Normocephalic and atraumatic.     Right Ear: Tympanic membrane, ear canal and external ear normal.     Left Ear: Tympanic membrane, ear canal and external ear normal.     Nose: Nose normal.     Mouth/Throat:     Mouth: Mucous membranes are moist.     Pharynx: Oropharynx is clear.  Eyes:     Extraocular Movements: Extraocular movements intact.     Conjunctiva/sclera: Conjunctivae normal.     Pupils: Pupils are equal, round, and reactive to light.  Cardiovascular:     Rate and Rhythm: Normal rate and regular rhythm.     Pulses: Normal pulses.     Heart sounds: Normal heart sounds.   Pulmonary:     Effort: Pulmonary effort is normal.     Breath sounds: Normal breath sounds.  Chest:  Breasts:    Right: Normal. No swelling, bleeding, inverted nipple, mass or nipple discharge.     Left: Normal. No swelling, bleeding, inverted nipple, mass or nipple discharge.  Abdominal:     General: Abdomen is flat. Bowel sounds are normal.     Palpations: Abdomen is soft.  Genitourinary:    Comments: Deferred  Musculoskeletal:        General: Normal  range of motion.     Cervical back: Normal range of motion and neck supple.  Skin:    General: Skin is warm.  Neurological:     General: No focal deficit present.     Mental Status: He is alert.  Psychiatric:        Mood and Affect: Mood normal.        Behavior: Behavior normal.        Assessment And Plan:    Encounter for general adult medical examination w/o abnormal findings Assessment & Plan: A full exam was performed.  DRE deferred, per patient request.  He is advised to get 30-45 minutes of regular exercise, no less than four to five days per week. Both weight-bearing and aerobic exercises are recommended.  He is advised to follow a healthy diet with at least six fruits/veggies per day, decrease intake of red meat and other saturated fats and to increase fish intake to twice weekly.  Meats/fish should not be fried -- baked, boiled or broiled is preferable. It is also important to cut back on your sugar intake.  Be sure to read labels - try to avoid anything with added sugar, high fructose corn syrup or other sweeteners.  If you must use a sweetener, you can try stevia or monkfruit.  It is also important to avoid artificially sweetened foods/beverages and diet drinks. Lastly, wear SPF 50 sunscreen on exposed skin and when in direct sunlight for an extended period of time.  Be sure to avoid fast food restaurants and aim for at least 60 ounces of water daily.       Hypertensive heart and renal disease with renal failure, stage 1  through stage 4 or unspecified chronic kidney disease, without heart failure Assessment & Plan: Chronic, well controlled. EKG performed, NSR w/ incomplete RBBB and LAFB.  He will continue with Maxzide 75/50 1/2 tab daily and amlodipine 10mg  daily. He is reminded to follow a low sodium, heart healthy diet. He will rto in six months for re-evaluation.   Orders: -     CMP14+EGFR -     Lipid panel -     CBC -     POCT urinalysis dipstick -     EKG 12-Lead -     Microalbumin / creatinine urine ratio  Atherosclerosis of aorta (HCC) Assessment & Plan: Chronic, encouraged to comply with statin therapy and follow heart healthy diet. He will continue with atorvastatin 40mg  daily.    Paroxysmal atrial fibrillation (HCC) Assessment & Plan: Chronic, he is rate controlled and properly anticoagulated.   Orders: -     TSH  Chronic kidney disease, stage 3a (HCC) Assessment & Plan: Chronic, he is encouraged to stay well hydrated, avoid NSAIDs and keep BP controlled to prevent progression of CKD.    Orders: -     CMP14+EGFR -     PTH, intact and calcium -     Phosphorus -     Protein electrophoresis, serum  Pure hypercholesterolemia Assessment & Plan: Chronic, he will continue with atorvastatin 40mg  daily. He is encouraged to follow a heart healthy lifestyle.   Orders: -     Lipid panel  Chronic idiopathic constipation  Acquired thrombophilia (HCC) Assessment & Plan: He is currently on Eliquis due to underlying PAF.   Class 1 obesity due to excess calories with serious comorbidity and body mass index (BMI) of 31.0 to 31.9 in adult Assessment & Plan: He is encouraged to initially strive for BMI less than  30 to decrease cardiac risk. He is advised to exercise no less than 150 minutes per week.     Immunization due -     Tdap vaccine greater than or equal to 7yo IM  He is encouraged to strive for BMI less than 30 to decrease cardiac risk. Advised to aim for at least 150 minutes of  exercise per week.    Return for 1 year hm, 6 month bp & chol f/u.Marland Kitchen Patient was given opportunity to ask questions. Patient verbalized understanding of the plan and was able to repeat key elements of the plan. All questions were answered to their satisfaction.   I, Gwynneth Aliment, MD, have reviewed all documentation for this visit. The documentation on 05/31/23 for the exam, diagnosis, procedures, and orders are all accurate and complete.

## 2023-05-31 NOTE — Patient Instructions (Signed)
Health Maintenance, Male Adopting a healthy lifestyle and getting preventive care are important in promoting health and wellness. Ask your health care provider about: The right schedule for you to have regular tests and exams. Things you can do on your own to prevent diseases and keep yourself healthy. What should I know about diet, weight, and exercise? Eat a healthy diet  Eat a diet that includes plenty of vegetables, fruits, low-fat dairy products, and lean protein. Do not eat a lot of foods that are high in solid fats, added sugars, or sodium. Maintain a healthy weight Body mass index (BMI) is a measurement that can be used to identify possible weight problems. It estimates body fat based on height and weight. Your health care provider can help determine your BMI and help you achieve or maintain a healthy weight. Get regular exercise Get regular exercise. This is one of the most important things you can do for your health. Most adults should: Exercise for at least 150 minutes each week. The exercise should increase your heart rate and make you sweat (moderate-intensity exercise). Do strengthening exercises at least twice a week. This is in addition to the moderate-intensity exercise. Spend less time sitting. Even light physical activity can be beneficial. Watch cholesterol and blood lipids Have your blood tested for lipids and cholesterol at 75 years of age, then have this test every 5 years. You may need to have your cholesterol levels checked more often if: Your lipid or cholesterol levels are high. You are older than 75 years of age. You are at high risk for heart disease. What should I know about cancer screening? Many types of cancers can be detected early and may often be prevented. Depending on your health history and family history, you may need to have cancer screening at various ages. This may include screening for: Colorectal cancer. Prostate cancer. Skin cancer. Lung  cancer. What should I know about heart disease, diabetes, and high blood pressure? Blood pressure and heart disease High blood pressure causes heart disease and increases the risk of stroke. This is more likely to develop in people who have high blood pressure readings or are overweight. Talk with your health care provider about your target blood pressure readings. Have your blood pressure checked: Every 3-5 years if you are 18-39 years of age. Every year if you are 40 years old or older. If you are between the ages of 65 and 75 and are a current or former smoker, ask your health care provider if you should have a one-time screening for abdominal aortic aneurysm (AAA). Diabetes Have regular diabetes screenings. This checks your fasting blood sugar level. Have the screening done: Once every three years after age 45 if you are at a normal weight and have a low risk for diabetes. More often and at a younger age if you are overweight or have a high risk for diabetes. What should I know about preventing infection? Hepatitis B If you have a higher risk for hepatitis B, you should be screened for this virus. Talk with your health care provider to find out if you are at risk for hepatitis B infection. Hepatitis C Blood testing is recommended for: Everyone born from 1945 through 1965. Anyone with known risk factors for hepatitis C. Sexually transmitted infections (STIs) You should be screened each year for STIs, including gonorrhea and chlamydia, if: You are sexually active and are younger than 75 years of age. You are older than 75 years of age and your   health care provider tells you that you are at risk for this type of infection. Your sexual activity has changed since you were last screened, and you are at increased risk for chlamydia or gonorrhea. Ask your health care provider if you are at risk. Ask your health care provider about whether you are at high risk for HIV. Your health care provider  may recommend a prescription medicine to help prevent HIV infection. If you choose to take medicine to prevent HIV, you should first get tested for HIV. You should then be tested every 3 months for as long as you are taking the medicine. Follow these instructions at home: Alcohol use Do not drink alcohol if your health care provider tells you not to drink. If you drink alcohol: Limit how much you have to 0-2 drinks a day. Know how much alcohol is in your drink. In the [place] one drink equals one 12 oz bottle of beer (355 mL), one 5 oz glass of wine (148 mL), or one 1 oz glass of hard liquor (44 mL). Lifestyle Do not use any products that contain nicotine or tobacco. These products include cigarettes, chewing tobacco, and vaping devices, such as e-cigarettes. If you need help quitting, ask your health care provider. Do not use street drugs. Do not share needles. Ask your health care provider for help if you need support or information about quitting drugs. General instructions Schedule regular health, dental, and eye exams. Stay current with your vaccines. Tell your health care provider if: You often feel depressed. You have ever been abused or do not feel safe at home. Summary Adopting a healthy lifestyle and getting preventive care are important in promoting health and wellness. Follow your health care provider's instructions about healthy diet, exercising, and getting tested or screened for diseases. Follow your health care provider's instructions on monitoring your cholesterol and blood pressure. This information is not intended to replace advice given to you by your health care provider. Make sure you discuss any questions you have with your health care provider. Document Revised: 12/27/2020 Document Reviewed: 12/27/2020 Elsevier Patient Education  2024 [employer]  

## 2023-05-31 NOTE — Assessment & Plan Note (Signed)
 Chronic, he is encouraged to stay well hydrated, avoid NSAIDs and keep BP controlled to prevent progression of CKD.

## 2023-05-31 NOTE — Assessment & Plan Note (Addendum)

## 2023-05-31 NOTE — Assessment & Plan Note (Addendum)
Chronic, he is currently in sinus rhythm and properly anticoagulated.

## 2023-06-04 LAB — CMP14+EGFR
ALT: 14 [IU]/L (ref 0–44)
AST: 20 [IU]/L (ref 0–40)
Albumin: 4.4 g/dL (ref 3.8–4.8)
Alkaline Phosphatase: 84 [IU]/L (ref 44–121)
BUN/Creatinine Ratio: 11 (ref 10–24)
BUN: 15 mg/dL (ref 8–27)
Bilirubin Total: 0.3 mg/dL (ref 0.0–1.2)
CO2: 22 mmol/L (ref 20–29)
Calcium: 9.8 mg/dL (ref 8.6–10.2)
Chloride: 105 mmol/L (ref 96–106)
Creatinine, Ser: 1.32 mg/dL — ABNORMAL HIGH (ref 0.76–1.27)
Globulin, Total: 2.7 g/dL (ref 1.5–4.5)
Glucose: 97 mg/dL (ref 70–99)
Potassium: 4.1 mmol/L (ref 3.5–5.2)
Sodium: 143 mmol/L (ref 134–144)
Total Protein: 7.1 g/dL (ref 6.0–8.5)
eGFR: 56 mL/min/{1.73_m2} — ABNORMAL LOW (ref >59)

## 2023-06-04 LAB — PROTEIN ELECTROPHORESIS, SERUM
A/G Ratio: 1.3 (ref 0.7–1.7)
Albumin ELP: 4 g/dL (ref 2.9–4.4)
Alpha 1: 0.2 g/dL (ref 0.0–0.4)
Alpha 2: 0.6 g/dL (ref 0.4–1.0)
Beta: 1.1 g/dL (ref 0.7–1.3)
Gamma Globulin: 1.2 g/dL (ref 0.4–1.8)
Globulin, Total: 3.1 g/dL (ref 2.2–3.9)

## 2023-06-04 LAB — LIPID PANEL
Chol/HDL Ratio: 2.7 {ratio} (ref 0.0–5.0)
Cholesterol, Total: 144 mg/dL (ref 100–199)
HDL: 54 mg/dL (ref >39)
LDL Chol Calc (NIH): 76 mg/dL (ref 0–99)
Triglycerides: 73 mg/dL (ref 0–149)
VLDL Cholesterol Cal: 14 mg/dL (ref 5–40)

## 2023-06-04 LAB — CBC
Hematocrit: 38.7 % (ref 37.5–51.0)
Hemoglobin: 12.8 g/dL — ABNORMAL LOW (ref 13.0–17.7)
MCH: 30.6 pg (ref 26.6–33.0)
MCHC: 33.1 g/dL (ref 31.5–35.7)
MCV: 93 fL (ref 79–97)
Platelets: 227 10*3/uL (ref 150–450)
RBC: 4.18 x10E6/uL (ref 4.14–5.80)
RDW: 13.1 % (ref 11.6–15.4)
WBC: 3.3 10*3/uL — ABNORMAL LOW (ref 3.4–10.8)

## 2023-06-04 LAB — PTH, INTACT AND CALCIUM: PTH: 45 pg/mL (ref 15–65)

## 2023-06-04 LAB — MICROALBUMIN / CREATININE URINE RATIO
Creatinine, Urine: 158.4 mg/dL
Microalb/Creat Ratio: 11 mg/g{creat} (ref 0–29)
Microalbumin, Urine: 17.5 ug/mL

## 2023-06-04 LAB — TSH: TSH: 0.794 u[IU]/mL (ref 0.450–4.500)

## 2023-06-04 LAB — PHOSPHORUS: Phosphorus: 2.8 mg/dL (ref 2.8–4.1)

## 2023-06-09 DIAGNOSIS — K5904 Chronic idiopathic constipation: Secondary | ICD-10-CM | POA: Insufficient documentation

## 2023-06-09 NOTE — Assessment & Plan Note (Signed)
Chronic, he will continue with atorvastatin 40mg  daily. He is encouraged to follow a heart healthy lifestyle.

## 2023-06-09 NOTE — Assessment & Plan Note (Signed)
Chronic, well controlled. EKG performed, NSR w/ incomplete RBBB and LAFB.  He will continue with Maxzide 75/50 1/2 tab daily and amlodipine 10mg  daily. He is reminded to follow a low sodium, heart healthy diet. He will rto in six months for re-evaluation.

## 2023-06-09 NOTE — Assessment & Plan Note (Signed)
He is currently on Eliquis due to underlying PAF.

## 2023-06-09 NOTE — Assessment & Plan Note (Signed)
He is encouraged to initially strive for BMI less than 30 to decrease cardiac risk. He is advised to exercise no less than 150 minutes per week.

## 2023-06-10 NOTE — Assessment & Plan Note (Signed)
Chronic, he may benefit from Miralax daily. If ineffective, may benefit from trial of Linzess.  He is encouraged to stay well hydrated and to aim for at least 25-35 grams of fiber daily.

## 2023-07-09 LAB — SPECIMEN STATUS REPORT

## 2023-07-09 LAB — IRON AND TIBC
Iron Saturation: 15 % (ref 15–55)
Iron: 49 ug/dL (ref 38–169)
Total Iron Binding Capacity: 331 ug/dL (ref 250–450)
UIBC: 282 ug/dL (ref 111–343)

## 2023-07-09 LAB — FERRITIN: Ferritin: 32 ng/mL (ref 30–400)

## 2023-11-07 ENCOUNTER — Ambulatory Visit (INDEPENDENT_AMBULATORY_CARE_PROVIDER_SITE_OTHER): Payer: TRICARE For Life (TFL)

## 2023-11-07 DIAGNOSIS — Z0000 Encounter for general adult medical examination without abnormal findings: Secondary | ICD-10-CM

## 2023-11-07 NOTE — Progress Notes (Signed)
 Subjective:   Richard Sampson is a 78 y.o. who presents for a Medicare Wellness preventive visit.  Visit Complete: Virtual I connected with  Richard Sampson on 11/07/23 by a audio enabled telemedicine application and verified that I am speaking with the correct person using two identifiers.  Patient Location: Home  Provider Location: Office/Clinic  I discussed the limitations of evaluation and management by telemedicine. The patient expressed understanding and agreed to proceed.  Vital Signs: Because this visit was a virtual/telehealth visit, some criteria may be missing or patient reported. Any vitals not documented were not able to be obtained and vitals that have been documented are patient reported.  VideoError- Librarian, academic were attempted between this provider and patient, however failed, due to patient having technical difficulties OR patient did not have access to video capability.  We continued and completed visit with audio only.   Persons Participating in Visit: Patient.  AWV Questionnaire: No: Patient Medicare AWV questionnaire was not completed prior to this visit.  Cardiac Risk Factors include: advanced age (>52men, >98 women);dyslipidemia;hypertension;male gender     Objective:    Today's Vitals   There is no height or weight on file to calculate BMI.     11/07/2023    3:32 PM 11/02/2022    9:04 AM 04/07/2022    4:15 AM 10/26/2021    8:31 AM 07/27/2021    4:24 PM 07/27/2021    8:10 AM 04/19/2021    7:26 PM  Advanced Directives  Does Patient Have a Medical Advance Directive? Yes Yes Yes Yes Yes No No  Type of Estate agent of [place];Living will Healthcare Power of [place];Living will Healthcare Power of [place];Living will Healthcare Power of [place];Living will Living will    Does patient want to make changes to medical advance directive?   No - Patient declined  No - Patient declined    Copy of  Healthcare Power of Attorney in Chart? Yes - validated most recent copy scanned in chart (See row information) Yes - validated most recent copy scanned in chart (See row information) No - copy requested Yes - validated most recent copy scanned in chart (See row information)     Would patient like information on creating a medical advance directive?     No - Patient declined No - Patient declined     Current Medications (verified) Outpatient Encounter Medications as of 11/07/2023  Medication Sig   amLODipine (NORVASC) 10 MG tablet Take 10 mg by mouth daily.   apixaban (ELIQUIS) 5 MG TABS tablet Take 1 tablet by mouth 2 times a day. Need to schedule appt with cardiologist for refills.   ascorbic acid (VITAMIN C) 250 MG tablet Take 250 mg by mouth.   atorvastatin (LIPITOR) 40 MG tablet Take 40 mg by mouth daily.   camphor-menthol (SARNA) lotion Apply topically.   cholecalciferol (VITAMIN D3) 10 MCG (400 UNIT) TABS tablet Take 400 Units by mouth.   cyanocobalamin (VITAMIN B12) 500 MCG tablet Take 2 tablets by mouth daily.   ferrous sulfate 325 (65 FE) MG tablet Take 325 mg by mouth daily with breakfast.   pantoprazole (PROTONIX) 40 MG tablet Take 1 tablet (40 mg total) by mouth daily at 6 (six) AM.   potassium chloride SA (K-DUR,KLOR-CON) 20 MEQ tablet Take 40 mEq by mouth 2 (two) times daily. Morning and evening   selenium sulfide (SELSUN) 2.5 % lotion Apply topically daily as needed for irritation. Apply topically.   simethicone (  MYLICON) 80 MG chewable tablet Chew by mouth.   tamsulosin (FLOMAX) 0.4 MG CAPS capsule Take 0.4 mg by mouth.   triamterene-hydrochlorothiazide (MAXZIDE) 75-50 MG per tablet Take 0.5 tablets by mouth every evening.    No facility-administered encounter medications on file as of 11/07/2023.    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 12/2019 Cardiac CT: 3 vessel cor Ca2+. Ca2+ = 144 (66th %i'le); b. 12/2019 MV: EF 60%, No ischemia/infarct. Ex time 7:30, Max HR 139.   Heart murmur    a. 09/2015 Echo: EF 55-60%, no rwma, GrI DD, mildly dil LA/RA.   High cholesterol    History of GIB (gastrointestinal bleeding)    a. Angiodysplasia of intestine several years ago.   Hypertension    PAF (paroxysmal atrial fibrillation) (HCC) 07/17/2014   a. CHA2DS2VASc = 3-->eliquis.   Pneumonia    Thoracic ascending aortic aneurysm (HCC)    a. 09/2020 CT Chest: Stable aneurysmal dilatation of the ascending thoracic aorta measuring 4.3 cm.   Past Surgical History:  Procedure Laterality Date   left wrist surgery Left 1966   glass cut ligaments, surgical repair   PARTIAL NEPHRECTOMY  11/07/2022   TONSILLECTOMY  1955   VASECTOMY  1980   Family History  Problem Relation Age of Onset   Thyroid nodules Mother    Dementia Mother    Heart failure Father    Cancer Father    Kidney disease Son    Diabetes Brother    Cancer Brother    Heart attack Neg Hx    Stroke Neg Hx    Social History   Socioeconomic History   Marital status: Married    Spouse name: Not on file   Number of children: Not on file   Years of education: Not on file   Highest education level: Not on file  Occupational History   Occupation: retired  Tobacco Use   Smoking status: Every Day    Types: Cigars   Smokeless tobacco: Never   Tobacco comments:    Started at age 17 - 1ppdx 59, quit x 5; pipex5 years, cigarsx15  Vaping Use   Vaping status: Never Used  Substance and Sexual Activity   Alcohol use: Not Currently    Comment: 1.5 oz every three to four months   Drug use: Not Currently   Sexual activity: Yes  Other Topics Concern   Not on file  Social History Narrative   Lives locally with wife.  2 grown children.  Retired Human resources officer. Exercises regularly @ [employer].   Social Drivers of  Corporate investment banker Strain: Low Risk  (11/07/2023)   Overall Financial Resource Strain (CARDIA)    Difficulty of Paying Living Expenses: Not hard at all  Food Insecurity: No Food Insecurity (11/07/2023)   Hunger Vital Sign    Worried About Running Out of Food in the Last Year: Never true    Ran Out of Food in the Last Year: Never true  Transportation Needs: No Transportation Needs (11/07/2023)   PRAPARE - Administrator, Civil Service (Medical): No    Lack of Transportation (Non-Medical): No  Physical Activity: Inactive (11/07/2023)   Exercise Vital Sign    Days of Exercise per Week: 0 days    Minutes of Exercise per Session: 0 min  Stress: No Stress Concern Present (11/07/2023)   [employer] of Occupational Health - Occupational Stress Questionnaire    Feeling of Stress : Not at all  Social Connections: Moderately Isolated (11/07/2023)   Social Connection and Isolation Panel [NHANES]    Frequency of Communication with Friends and Family: Once a week    Frequency of Social Gatherings with Friends and Family: Once a week    Attends Religious Services: More than 4 times per year    Active Member of [employer] or Organizations: No    Attends Engineer, structural: Never    Marital Status: Married    Tobacco Counseling Ready to quit: Not Answered Counseling given: Not Answered Tobacco comments: Started at age 79 - 1ppdx 70, quit x 5; pipex5 years, cigarsx15    Clinical Intake:  Pre-visit preparation completed: Yes  Pain : No/denies pain     Nutritional Risks: None Diabetes: No  Lab Results  Component Value Date   HGBA1C 5.5 05/23/2022   HGBA1C 5.6 02/07/2021   HGBA1C 5.5 05/03/2020     How often do you need to have someone help you when you read instructions, pamphlets, or other written materials from your doctor or pharmacy?: 1 - Never  Interpreter Needed?: No  Information entered by :: NAllen LPN   Activities of Daily Living      11/07/2023    3:21 PM  In your present state of health, do you have any difficulty performing the following activities:  Hearing? 0  Vision? 1  Comment has floaters in left eye sometimes  Difficulty concentrating or making decisions? 0  Walking or climbing stairs? 0  Dressing or bathing? 0  Doing errands, shopping? 0  Preparing Food and eating ? N  Using the Toilet? N  In the past six months, have you accidently leaked urine? N  Do you have problems with loss of bowel control? N  Managing your Medications? N  Managing your Finances? N  Housekeeping or managing your Housekeeping? N    Patient Care Team: [name], MD as PCP - General (Internal Medicine) Quintella Reichert, MD as PCP - Cardiology (Cardiology)  Indicate any recent Medical Services you may have received from other than [facility] providers in the past year (date may be approximate).     Assessment:   This is a routine wellness examination for Masayuki.  Hearing/Vision screen Hearing Screening - Comments:: Denies hearing issues Vision Screening - Comments:: [facility] eye [facility], [facility]   Goals Addressed             This Visit's Progress    Patient Stated       11/07/2023, get back to riding bike       Depression Screen     11/07/2023    3:34 PM 05/31/2023   10:27 AM 03/22/2023   10:19 AM 11/02/2022    9:04 AM 10/31/2022    9:01 AM 10/26/2021    8:32 AM 10/20/2020    8:49 AM  PHQ 2/9 Scores  PHQ - 2 Score 0 0 0 0 0 0 0  PHQ- 9 Score 1 0 0        Fall Risk     11/07/2023    3:33 PM 05/31/2023   10:27 AM 03/22/2023   10:19 AM 11/02/2022    9:04 AM 10/31/2022    9:01 AM  Fall Risk   Falls in the past year? 0 0 0 0 0  Number falls in past yr: 0 0 0  0 0  Injury with Fall? 0 0 0 0 0  Risk for fall due to : Medication side effect No Fall Risks No Fall Risks No Fall Risks;Medication side effect No Fall Risks  Follow up Falls prevention discussed;Falls evaluation completed Falls evaluation completed Falls evaluation  completed Falls prevention discussed;Education provided;Falls evaluation completed Falls evaluation completed    MEDICARE RISK AT HOME:  Medicare Risk at Home Any stairs in or around the home?: No If so, are there any without handrails?: No Home free of loose throw rugs in walkways, pet beds, electrical cords, etc?: Yes Adequate lighting in your home to reduce risk of falls?: Yes Life alert?: No Use of a cane, walker or w/c?: No Grab bars in the bathroom?: Yes Shower chair or bench in shower?: Yes Elevated toilet seat or a handicapped toilet?: No  TIMED UP AND GO:  Was the test performed?  No  Cognitive Function: 6CIT completed        11/07/2023    3:34 PM 11/02/2022    9:05 AM 10/26/2021    8:34 AM 10/20/2020    8:50 AM 01/28/2020    9:17 AM  6CIT Screen  What Year? 0 points 0 points 0 points 0 points 0 points  What month? 0 points 0 points 0 points 0 points 0 points  What time? 0 points 0 points 0 points 0 points 0 points  Count back from 20 0 points 0 points 0 points 0 points 0 points  Months in reverse 0 points 0 points 0 points 0 points 0 points  Repeat phrase 10 points 0 points 2 points 4 points 0 points  Total Score 10 points 0 points 2 points 4 points 0 points    Immunizations Immunization History  Administered Date(s) Administered   Fluad Quad(high Dose 65+) 06/02/2020, 05/10/2021, 05/09/2022   Influenza, High Dose Seasonal PF 06/01/2016, 05/10/2017, 06/04/2018, 05/01/2019   Influenza, Seasonal, Injecte, Preservative Fre 05/08/2012, 06/18/2014   Influenza-Unspecified 07/07/2002, 07/12/2004, 07/22/2008, 09/20/2009, 05/17/2010, 05/11/2011, 05/22/2015, 06/21/2018, 04/22/2019, 05/10/2021, 05/09/2022, 05/07/2023   PFIZER Comirnaty(Gray Top)Covid-19 Tri-Sucrose Vaccine 12/15/2020   PFIZER(Purple Top)SARS-COV-2 Vaccination 09/21/2019, 10/12/2019, 05/28/2020   Pfizer Covid-19 Vaccine Bivalent Booster 3yrs & up 05/31/2021   Pfizer(Comirnaty)Fall Seasonal Vaccine 12 years and  older 05/30/2022   Pneumococcal Conjugate-13 11/07/2013, 05/22/2015   Pneumococcal Polysaccharide-23 01/01/2015, 01/31/2017   Pneumococcal-Unspecified 07/22/2008   Tdap 07/25/2010, 10/24/2011, 08/21/2012, 05/31/2023   Zoster Recombinant(Shingrix) 06/14/2018, 09/16/2018    Screening Tests Health Maintenance  Topic Date Due   Colonoscopy  04/30/2018   COVID-19 Vaccine (8 - Pfizer risk 2024-25 season) 11/04/2023   Medicare Annual Wellness (AWV)  11/06/2024   DTaP/Tdap/Td (5 - Td or Tdap) 05/30/2033   Pneumonia Vaccine 16+ Years old  Completed   INFLUENZA VACCINE  Completed   Hepatitis C Screening  Completed   Zoster Vaccines- Shingrix  Completed   HPV VACCINES  Aged Out    Health Maintenance  Health Maintenance Due  Topic Date Due   Colonoscopy  04/30/2018   COVID-19 Vaccine (8 - Pfizer risk 2024-25 season) 11/04/2023   Health Maintenance Items Addressed: Requested colonoscopy from [place]  Additional Screening:  Vision Screening: Recommended annual ophthalmology exams for early detection of glaucoma and other disorders of the eye.  Dental Screening: Recommended annual dental exams for proper oral hygiene  Community Resource Referral / Chronic Care Management: CRR required this visit?  No   CCM required this visit?  No     Plan:     I have  personally reviewed and noted the following in the patient's chart:   Medical and social history Use of alcohol, tobacco or illicit drugs  Current medications and supplements including opioid prescriptions. Patient is not currently taking opioid prescriptions. Functional ability and status Nutritional status Physical activity Advanced directives List of other physicians Hospitalizations, surgeries, and ER visits in previous 12 months Vitals Screenings to include cognitive, depression, and falls Referrals and appointments  In addition, I have reviewed and discussed with patient certain preventive protocols, quality metrics, and  best practice recommendations. A written personalized care plan for preventive services as well as general preventive health recommendations were provided to patient.     Barb Merino, LPN   1/61/0960   After Visit Summary: (MyChart) Due to this being a telephonic visit, the after visit summary with patients personalized plan was offered to patient via MyChart   Notes: Nothing significant to report at this time.

## 2023-11-07 NOTE — Patient Instructions (Signed)
 Richard Sampson , Thank you for taking time to come for your Medicare Wellness Visit. I appreciate your ongoing commitment to your health goals. Please review the following plan we discussed and let me know if I can assist you in the future.   Referrals/Orders/Follow-Ups/Clinician Recommendations: none  This is a list of the screening recommended for you and due dates:  Health Maintenance  Topic Date Due   Colon Cancer Screening  04/30/2018   COVID-19 Vaccine (8 - Pfizer risk 2024-25 season) 11/04/2023   Medicare Annual Wellness Visit  11/06/2024   DTaP/Tdap/Td vaccine (5 - Td or Tdap) 05/30/2033   Pneumonia Vaccine  Completed   Flu Shot  Completed   Hepatitis C Screening  Completed   Zoster (Shingles) Vaccine  Completed   HPV Vaccine  Aged Out    Advanced directives: (In Chart) A copy of your advanced directives are scanned into your chart should your provider ever need it.  Next Medicare Annual Wellness Visit scheduled for next year: Yes  insert Preventive Care attachment Insert FALL PREVENTION attachment if needed

## 2023-11-20 ENCOUNTER — Encounter: Payer: Self-pay | Admitting: Internal Medicine

## 2023-11-20 ENCOUNTER — Ambulatory Visit (INDEPENDENT_AMBULATORY_CARE_PROVIDER_SITE_OTHER): Payer: TRICARE For Life (TFL) | Admitting: Internal Medicine

## 2023-11-20 VITALS — BP 122/80 | HR 72 | Temp 97.6°F | Ht 70.0 in | Wt 223.0 lb

## 2023-11-20 DIAGNOSIS — F17200 Nicotine dependence, unspecified, uncomplicated: Secondary | ICD-10-CM | POA: Diagnosis not present

## 2023-11-20 DIAGNOSIS — L989 Disorder of the skin and subcutaneous tissue, unspecified: Secondary | ICD-10-CM

## 2023-11-20 DIAGNOSIS — N1831 Chronic kidney disease, stage 3a: Secondary | ICD-10-CM | POA: Diagnosis not present

## 2023-11-20 DIAGNOSIS — I480 Paroxysmal atrial fibrillation: Secondary | ICD-10-CM

## 2023-11-20 DIAGNOSIS — Z862 Personal history of diseases of the blood and blood-forming organs and certain disorders involving the immune mechanism: Secondary | ICD-10-CM

## 2023-11-20 DIAGNOSIS — I1310 Hypertensive heart and chronic kidney disease without heart failure, with stage 1 through stage 4 chronic kidney disease, or unspecified chronic kidney disease: Secondary | ICD-10-CM

## 2023-11-20 DIAGNOSIS — D6869 Other thrombophilia: Secondary | ICD-10-CM

## 2023-11-20 DIAGNOSIS — I700 Atherosclerosis of aorta: Secondary | ICD-10-CM | POA: Diagnosis not present

## 2023-11-20 DIAGNOSIS — E66811 Obesity, class 1: Secondary | ICD-10-CM | POA: Diagnosis not present

## 2023-11-20 DIAGNOSIS — E7800 Pure hypercholesterolemia, unspecified: Secondary | ICD-10-CM | POA: Diagnosis not present

## 2023-11-20 DIAGNOSIS — E6609 Other obesity due to excess calories: Secondary | ICD-10-CM

## 2023-11-20 DIAGNOSIS — Z6832 Body mass index (BMI) 32.0-32.9, adult: Secondary | ICD-10-CM

## 2023-11-20 NOTE — Assessment & Plan Note (Signed)
Chronic, encouraged to comply with statin therapy and follow heart healthy diet. He will continue with atorvastatin 40mg  daily.

## 2023-11-20 NOTE — Assessment & Plan Note (Signed)
 He is encouraged to initially strive for BMI less than 30 to decrease cardiac risk. He is advised to exercise no less than 150 minutes per week.

## 2023-11-20 NOTE — Progress Notes (Signed)
 I,Jameka J Llittleton, CMA,acting as a Neurosurgeon for Gwynneth Aliment, MD.,have documented all relevant documentation on the behalf of Gwynneth Aliment, MD,as directed by  Gwynneth Aliment, MD while in the presence of Gwynneth Aliment, MD.  Subjective:  Patient ID: Richard Sampson , male    DOB: Nov 01, 1945 , 78 y.o.   MRN: [id number]  Chief Complaint  Patient presents with   Hypertension   Hyperlipidemia    HPI  He is here today for BP and Chol check. He reports compliance with meds.  He denies having any headaches, chest pain and shortness of breath.        Hypertension This is a chronic problem. The current episode started more than 1 year ago. The problem has been gradually improving since onset. The problem is controlled. Pertinent negatives include no blurred vision, chest pain, palpitations or shortness of breath. The current treatment provides moderate improvement. Compliance problems include exercise.   Hyperlipidemia This is a chronic problem. The current episode started more than 1 year ago. The problem is controlled. Recent lipid tests were reviewed and are normal. Exacerbating diseases include obesity. He has no history of diabetes or hypothyroidism. Pertinent negatives include no chest pain or shortness of breath. Current antihyperlipidemic treatment includes statins. There are no compliance problems.      Past Medical History:  Diagnosis Date   Agatston coronary artery calcium score between 100 and 199    a. 12/2019 Cardiac CT: 3 vessel cor Ca2+. Ca2+ = 144 (66th %i'le); b. 12/2019 MV: EF 60%, No ischemia/infarct. Ex time 7:30, Max HR 139.   Heart murmur    a. 09/2015 Echo: EF 55-60%, no rwma, GrI DD, mildly dil LA/RA.   High cholesterol    History of GIB (gastrointestinal bleeding)    a. Angiodysplasia of intestine several years ago.   Hypertension    PAF (paroxysmal atrial fibrillation) (HCC) 07/17/2014   a. CHA2DS2VASc = 3-->eliquis.   Pneumonia    Thoracic ascending  aortic aneurysm (HCC)    a. 09/2020 CT Chest: Stable aneurysmal dilatation of the ascending thoracic aorta measuring 4.3 cm.     Family History  Problem Relation Age of Onset   Thyroid nodules Mother    Dementia Mother    Heart failure Father    Cancer Father    Kidney disease Son    Diabetes Brother    Cancer Brother    Heart attack Neg Hx    Stroke Neg Hx      Current Outpatient Medications:    amLODipine (NORVASC) 10 MG tablet, Take 10 mg by mouth daily., Disp: , Rfl:    apixaban (ELIQUIS) 5 MG TABS tablet, Take 1 tablet by mouth 2 times a day. Need to schedule appt with cardiologist for refills., Disp: 60 tablet, Rfl: 0   ascorbic acid (VITAMIN C) 250 MG tablet, Take 250 mg by mouth., Disp: , Rfl:    atorvastatin (LIPITOR) 40 MG tablet, Take 40 mg by mouth daily., Disp: , Rfl:    cholecalciferol (VITAMIN D3) 10 MCG (400 UNIT) TABS tablet, Take 400 Units by mouth., Disp: , Rfl:    cyanocobalamin (VITAMIN B12) 500 MCG tablet, Take 2 tablets by mouth daily., Disp: , Rfl:    ferrous sulfate 325 (65 FE) MG tablet, Take 325 mg by mouth daily with breakfast., Disp: , Rfl:    potassium chloride SA (K-DUR,KLOR-CON) 20 MEQ tablet, Take 40 mEq by mouth 2 (two) times daily. Morning and evening, Disp: , Rfl:  simethicone (MYLICON) 80 MG chewable tablet, Chew by mouth., Disp: , Rfl:    tamsulosin (FLOMAX) 0.4 MG CAPS capsule, Take 0.4 mg by mouth., Disp: , Rfl:    triamterene-hydrochlorothiazide (MAXZIDE) 75-50 MG per tablet, Take 0.5 tablets by mouth every evening. , Disp: , Rfl:    camphor-menthol (SARNA) lotion, Apply topically., Disp: , Rfl:    selenium sulfide (SELSUN) 2.5 % lotion, Apply topically daily as needed for irritation. Apply topically. (Patient not taking: Reported on 11/20/2023), Disp: 118 mL, Rfl: 1   Allergies  Allergen Reactions   Ace Inhibitors Swelling    Throat swelling; pt was on lisinopril   Angiotensin Receptor Blockers Swelling    Cross reactivity in known ACE I  allergy   Lisinopril Swelling    Throat swelling   Losartan Other (See Comments)    Doctor told pt not to take     Review of Systems  Constitutional: Negative.   Eyes:  Negative for blurred vision.  Respiratory: Negative.  Negative for shortness of breath.   Cardiovascular: Negative.  Negative for chest pain and palpitations.  Gastrointestinal: Negative.   Skin:        He has skin lesion on neck, wants to have this looked at.   Neurological: Negative.   Psychiatric/Behavioral: Negative.       Today's Vitals   11/20/23 0933  BP: 122/80  Pulse: 72  Temp: 97.6 F (36.4 C)  TempSrc: Oral  Weight: 223 lb (101.2 kg)  Height: 5\' 10"  (1.778 m)  PainSc: 0-No pain   Body mass index is 32 kg/m.  Wt Readings from Last 3 Encounters:  11/20/23 223 lb (101.2 kg)  05/31/23 217 lb 12.8 oz (98.8 kg)  03/22/23 215 lb 3.2 oz (97.6 kg)    The 10-year ASCVD risk score (Arnett DK, et al., 2019) is: 29.4%   Values used to calculate the score:     Age: 29 years     Sex: Male     Is Non-Hispanic African American: Yes     Diabetic: No     Tobacco smoker: Yes     Systolic Blood Pressure: 122 mmHg     Is BP treated: Yes     HDL Cholesterol: 54 mg/dL     Total Cholesterol: 144 mg/dL  Objective:  Physical Exam Vitals and nursing note reviewed.  Constitutional:      Appearance: Normal appearance.  HENT:     Head: Normocephalic and atraumatic.  Eyes:     Extraocular Movements: Extraocular movements intact.  Cardiovascular:     Rate and Rhythm: Normal rate. Rhythm irregular.     Heart sounds: Normal heart sounds.  Pulmonary:     Effort: Pulmonary effort is normal.     Breath sounds: Normal breath sounds.  Skin:    General: Skin is warm.     Comments: There is a hyperpigmented, scaly lesion on right side of neck. No overlying erythema.  Neurological:     General: No focal deficit present.     Mental Status: He is alert.  Psychiatric:        Mood and Affect: Mood normal.          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 He will continue with Maxzide 75/50 1/2 tab daily and amlodipine 10mg  daily. He is reminded to follow a low sodium, heart healthy  diet. He will rto in six months for re-evaluation.    Atherosclerosis of aorta [facility]) Assessment & Plan: Chronic, encouraged to comply with statin therapy and follow heart healthy diet. He will continue with atorvastatin 40mg  daily.   Orders: -     CMP14+EGFR  Chronic kidney disease, stage 3a (HCC) Assessment & Plan: Chronic, he is encouraged to stay well hydrated, avoid NSAIDs and keep BP controlled to prevent progression of CKD.    Orders: -     CMP14+EGFR -     PTH, intact and calcium -     Phosphorus -     Protein electrophoresis, serum  Paroxysmal atrial fibrillation (HCC) Assessment & Plan: Chronic, he is rate controlled and properly anticoagulated.    Skin lesion Assessment & Plan: Initially, he declined Derm referral, stating he preferred to go through the [place] for further evaluation.   Before leaving, he requested Derm referral.    Orders: -     Ambulatory referral to Dermatology  Pure hypercholesterolemia Assessment & Plan: Chronic, he will continue with atorvastatin 40mg  daily. He is encouraged to follow a heart healthy lifestyle.   Orders: -     Lipid panel  Tobacco use disorder Assessment & Plan: He agrees to LDCT. He is no longer smoking cigarettes, he has switched to cigars. Smoking cessation instruction/counseling given:  counseled patient on the dangers of tobacco use, advised patient to stop smoking, and reviewed strategies to maximize success   Orders: -     CT CHEST LUNG CANCER SCREENING LOW DOSE WO CONTRAST; Future  Acquired thrombophilia (HCC) Assessment & Plan: He is currently on Eliquis due to underlying  PAF.   Class 1 obesity due to excess calories with serious comorbidity and body mass index (BMI) of 32.0 to 32.9 in adult Assessment & Plan: He is encouraged to initially strive for BMI less than 30 to decrease cardiac risk. He is advised to exercise no less than 150 minutes per week.     History of iron deficiency anemia -     CBC -     Iron, TIBC and Ferritin Panel -     Vitamin B12    Return if symptoms worsen or fail to improve.  Patient was given opportunity to ask questions. Patient verbalized understanding of the plan and was able to repeat key elements of the plan. All questions were answered to their satisfaction.    I, Gwynneth Aliment, MD, have reviewed all documentation for this visit. The documentation on 11/20/23 for the exam, diagnosis, procedures, and orders are all accurate and complete.   IF YOU HAVE BEEN REFERRED TO A SPECIALIST, IT MAY TAKE 1-2 WEEKS TO SCHEDULE/PROCESS THE REFERRAL. IF YOU HAVE NOT HEARD FROM US/SPECIALIST IN TWO WEEKS, PLEASE GIVE [place] A CALL AT [phone].

## 2023-11-20 NOTE — Assessment & Plan Note (Signed)
 Chronic, well controlled. He will continue with Maxzide 75/50 1/2 tab daily and amlodipine 10mg  daily. He is reminded to follow a low sodium, heart healthy diet. He will rto in six months for re-evaluation.

## 2023-11-20 NOTE — Patient Instructions (Signed)
 Hypertension, Adult Hypertension is another name for high blood pressure. High blood pressure forces your heart to work harder to pump blood. This can cause problems over time. There are two numbers in a blood pressure reading. There is a top number (systolic) over a bottom number (diastolic). It is best to have a blood pressure that is below 120/80. What are the causes? The cause of this condition is not known. Some other conditions can lead to high blood pressure. What increases the risk? Some lifestyle factors can make you more likely to develop high blood pressure: Smoking. Not getting enough exercise or physical activity. Being overweight. Having too much fat, sugar, calories, or salt (sodium) in your diet. Drinking too much alcohol. Other risk factors include: Having any of these conditions: Heart disease. Diabetes. High cholesterol. Kidney disease. Obstructive sleep apnea. Having a family history of high blood pressure and high cholesterol. Age. The risk increases with age. Stress. What are the signs or symptoms? High blood pressure may not cause symptoms. Very high blood pressure (hypertensive crisis) may cause: Headache. Fast or uneven heartbeats (palpitations). Shortness of breath. Nosebleed. Vomiting or feeling like you may vomit (nauseous). Changes in how you see. Very bad chest pain. Feeling dizzy. Seizures. How is this treated? This condition is treated by making healthy lifestyle changes, such as: Eating healthy foods. Exercising more. Drinking less alcohol. Your doctor may prescribe medicine if lifestyle changes do not help enough and if: Your top number is above 130. Your bottom number is above 80. Your personal target blood pressure may vary. Follow these instructions at home: Eating and drinking  If told, follow the DASH eating plan. To follow this plan: Fill one half of your plate at each meal with fruits and vegetables. Fill one fourth of your plate  at each meal with whole grains. Whole grains include whole-wheat pasta, brown rice, and whole-grain bread. Eat or drink low-fat dairy products, such as skim milk or low-fat yogurt. Fill one fourth of your plate at each meal with low-fat (lean) proteins. Low-fat proteins include fish, chicken without skin, eggs, beans, and tofu. Avoid fatty meat, cured and processed meat, or chicken with skin. Avoid pre-made or processed food. Limit the amount of salt in your diet to less than 1,500 mg each day. Do not drink alcohol if: Your doctor tells you not to drink. You are pregnant, may be pregnant, or are planning to become pregnant. If you drink alcohol: Limit how much you have to: 0-1 drink a day for women. 0-2 drinks a day for men. Know how much alcohol is in your drink. In the [place] one drink equals one 12 oz bottle of beer (355 mL), one 5 oz glass of wine (148 mL), or one 1 oz glass of hard liquor (44 mL). Lifestyle  Work with your doctor to stay at a healthy weight or to lose weight. Ask your doctor what the best weight is for you. Get at least 30 minutes of exercise that causes your heart to beat faster (aerobic exercise) most days of the week. This may include walking, swimming, or biking. Get at least 30 minutes of exercise that strengthens your muscles (resistance exercise) at least 3 days a week. This may include lifting weights or doing Pilates. Do not smoke or use any products that contain nicotine or tobacco. If you need help quitting, ask your doctor. Check your blood pressure at home as told by your doctor. Keep all follow-up visits. Medicines Take over-the-counter and prescription medicines  only as told by your doctor. Follow directions carefully. Do not skip doses of blood pressure medicine. The medicine does not work as well if you skip doses. Skipping doses also puts you at risk for problems. Ask your doctor about side effects or reactions to medicines that you should watch  for. Contact a doctor if: You think you are having a reaction to the medicine you are taking. You have headaches that keep coming back. You feel dizzy. You have swelling in your ankles. You have trouble with your vision. Get help right away if: You get a very bad headache. You start to feel mixed up (confused). You feel weak or numb. You feel faint. You have very bad pain in your: Chest. Belly (abdomen). You vomit more than once. You have trouble breathing. These symptoms may be an emergency. Get help right away. Call 911. Do not wait to see if the symptoms will go away. Do not drive yourself to the hospital. Summary Hypertension is another name for high blood pressure. High blood pressure forces your heart to work harder to pump blood. For most people, a normal blood pressure is less than 120/80. Making healthy choices can help lower blood pressure. If your blood pressure does not get lower with healthy choices, you may need to take medicine. This information is not intended to replace advice given to you by your health care provider. Make sure you discuss any questions you have with your health care provider. Document Revised: 05/26/2021 Document Reviewed: 05/26/2021 Elsevier Patient Education  2024 [employer].

## 2023-11-20 NOTE — Assessment & Plan Note (Signed)
 Chronic, he is encouraged to stay well hydrated, avoid NSAIDs and keep BP controlled to prevent progression of CKD.

## 2023-11-20 NOTE — Assessment & Plan Note (Signed)
He is currently on Eliquis due to underlying PAF.

## 2023-11-20 NOTE — Assessment & Plan Note (Signed)
 Initially, he declined Derm referral, stating he preferred to go through the [place] for further evaluation.   Before leaving, he requested Derm referral.

## 2023-11-20 NOTE — Assessment & Plan Note (Signed)
Chronic, he will continue with atorvastatin 40mg  daily. He is encouraged to follow a heart healthy lifestyle.

## 2023-11-20 NOTE — Assessment & Plan Note (Signed)
 He agrees to LDCT. He is no longer smoking cigarettes, he has switched to cigars. Smoking cessation instruction/counseling given:  counseled patient on the dangers of tobacco use, advised patient to stop smoking, and reviewed strategies to maximize success

## 2023-11-20 NOTE — Assessment & Plan Note (Addendum)
Chronic, he is rate controlled and properly anticoagulated.

## 2023-11-22 LAB — CBC
Hematocrit: 37.1 % — ABNORMAL LOW (ref 37.5–51.0)
Hemoglobin: 12.1 g/dL — ABNORMAL LOW (ref 13.0–17.7)
MCH: 30.6 pg (ref 26.6–33.0)
MCHC: 32.6 g/dL (ref 31.5–35.7)
MCV: 94 fL (ref 79–97)
Platelets: 240 10*3/uL (ref 150–450)
RBC: 3.96 x10E6/uL — ABNORMAL LOW (ref 4.14–5.80)
RDW: 12.7 % (ref 11.6–15.4)
WBC: 3.4 10*3/uL (ref 3.4–10.8)

## 2023-11-22 LAB — PHOSPHORUS: Phosphorus: 3 mg/dL (ref 2.8–4.1)

## 2023-11-22 LAB — PROTEIN ELECTROPHORESIS, SERUM
A/G Ratio: 1.1 (ref 0.7–1.7)
Albumin ELP: 3.6 g/dL (ref 2.9–4.4)
Alpha 1: 0.2 g/dL (ref 0.0–0.4)
Alpha 2: 0.6 g/dL (ref 0.4–1.0)
Beta: 1.1 g/dL (ref 0.7–1.3)
Gamma Globulin: 1.3 g/dL (ref 0.4–1.8)
Globulin, Total: 3.2 g/dL (ref 2.2–3.9)

## 2023-11-22 LAB — LIPID PANEL
Chol/HDL Ratio: 2.7 ratio (ref 0.0–5.0)
Cholesterol, Total: 148 mg/dL (ref 100–199)
HDL: 54 mg/dL (ref >39)
LDL Chol Calc (NIH): 80 mg/dL (ref 0–99)
Triglycerides: 73 mg/dL (ref 0–149)
VLDL Cholesterol Cal: 14 mg/dL (ref 5–40)

## 2023-11-22 LAB — CMP14+EGFR
ALT: 14 IU/L (ref 0–44)
AST: 20 IU/L (ref 0–40)
Albumin: 4.2 g/dL (ref 3.8–4.8)
Alkaline Phosphatase: 80 IU/L (ref 44–121)
BUN/Creatinine Ratio: 13 (ref 10–24)
BUN: 15 mg/dL (ref 8–27)
Bilirubin Total: 0.3 mg/dL (ref 0.0–1.2)
CO2: 25 mmol/L (ref 20–29)
Calcium: 9.6 mg/dL (ref 8.6–10.2)
Chloride: 106 mmol/L (ref 96–106)
Creatinine, Ser: 1.17 mg/dL (ref 0.76–1.27)
Globulin, Total: 2.6 g/dL (ref 1.5–4.5)
Glucose: 96 mg/dL (ref 70–99)
Potassium: 4 mmol/L (ref 3.5–5.2)
Sodium: 144 mmol/L (ref 134–144)
Total Protein: 6.8 g/dL (ref 6.0–8.5)
eGFR: 65 mL/min/{1.73_m2} (ref >59)

## 2023-11-22 LAB — IRON,TIBC AND FERRITIN PANEL
Ferritin: 24 ng/mL — ABNORMAL LOW (ref 30–400)
Iron Saturation: 13 % — ABNORMAL LOW (ref 15–55)
Iron: 45 ug/dL (ref 38–169)
Total Iron Binding Capacity: 347 ug/dL (ref 250–450)
UIBC: 302 ug/dL (ref 111–343)

## 2023-11-22 LAB — VITAMIN B12: Vitamin B-12: 808 pg/mL (ref 232–1245)

## 2023-11-22 LAB — PTH, INTACT AND CALCIUM: PTH: 42 pg/mL (ref 15–65)

## 2023-11-24 ENCOUNTER — Encounter: Payer: Self-pay | Admitting: Internal Medicine

## 2023-12-01 ENCOUNTER — Ambulatory Visit (HOSPITAL_BASED_OUTPATIENT_CLINIC_OR_DEPARTMENT_OTHER)
Admission: RE | Admit: 2023-12-01 | Discharge: 2023-12-01 | Disposition: A | Discharge status: Home / Self Care | Source: Ambulatory Visit | Attending: Internal Medicine | Admitting: Internal Medicine

## 2023-12-01 DIAGNOSIS — I700 Atherosclerosis of aorta: Secondary | ICD-10-CM | POA: Diagnosis not present

## 2023-12-01 DIAGNOSIS — F17210 Nicotine dependence, cigarettes, uncomplicated: Secondary | ICD-10-CM | POA: Insufficient documentation

## 2023-12-01 DIAGNOSIS — J439 Emphysema, unspecified: Secondary | ICD-10-CM | POA: Diagnosis not present

## 2023-12-01 DIAGNOSIS — K8020 Calculus of gallbladder without cholecystitis without obstruction: Secondary | ICD-10-CM | POA: Insufficient documentation

## 2023-12-01 DIAGNOSIS — I2510 Atherosclerotic heart disease of native coronary artery without angina pectoris: Secondary | ICD-10-CM | POA: Insufficient documentation

## 2023-12-01 DIAGNOSIS — Z122 Encounter for screening for malignant neoplasm of respiratory organs: Secondary | ICD-10-CM | POA: Insufficient documentation

## 2023-12-01 DIAGNOSIS — F17200 Nicotine dependence, unspecified, uncomplicated: Secondary | ICD-10-CM | POA: Insufficient documentation

## 2023-12-01 DIAGNOSIS — I77810 Thoracic aortic ectasia: Secondary | ICD-10-CM | POA: Insufficient documentation

## 2023-12-06 DIAGNOSIS — D044 Carcinoma in situ of skin of scalp and neck: Secondary | ICD-10-CM | POA: Diagnosis not present

## 2023-12-06 DIAGNOSIS — D492 Neoplasm of unspecified behavior of bone, soft tissue, and skin: Secondary | ICD-10-CM | POA: Diagnosis not present

## 2023-12-10 ENCOUNTER — Other Ambulatory Visit: Payer: Self-pay

## 2023-12-10 MED ORDER — FUSION PLUS PO CAPS: 1.0000 | ORAL_CAPSULE | Freq: Every day | ORAL | Status: AC

## 2023-12-28 ENCOUNTER — Encounter: Payer: Self-pay | Admitting: Internal Medicine

## 2024-01-09 ENCOUNTER — Ambulatory Visit: Payer: Self-pay

## 2024-01-22 DIAGNOSIS — D044 Carcinoma in situ of skin of scalp and neck: Secondary | ICD-10-CM | POA: Diagnosis not present

## 2024-02-06 ENCOUNTER — Ambulatory Visit (INDEPENDENT_AMBULATORY_CARE_PROVIDER_SITE_OTHER): Payer: Self-pay | Admitting: Internal Medicine

## 2024-02-06 ENCOUNTER — Encounter: Payer: Self-pay | Admitting: Internal Medicine

## 2024-02-06 VITALS — BP 128/80 | Temp 98.3°F | Ht 70.0 in | Wt 224.6 lb

## 2024-02-06 DIAGNOSIS — E66811 Obesity, class 1: Secondary | ICD-10-CM | POA: Diagnosis not present

## 2024-02-06 DIAGNOSIS — I1310 Hypertensive heart and chronic kidney disease without heart failure, with stage 1 through stage 4 chronic kidney disease, or unspecified chronic kidney disease: Secondary | ICD-10-CM

## 2024-02-06 DIAGNOSIS — K5904 Chronic idiopathic constipation: Secondary | ICD-10-CM

## 2024-02-06 DIAGNOSIS — I700 Atherosclerosis of aorta: Secondary | ICD-10-CM | POA: Diagnosis not present

## 2024-02-06 DIAGNOSIS — N1831 Chronic kidney disease, stage 3a: Secondary | ICD-10-CM | POA: Diagnosis not present

## 2024-02-06 DIAGNOSIS — K219 Gastro-esophageal reflux disease without esophagitis: Secondary | ICD-10-CM

## 2024-02-06 DIAGNOSIS — Z6832 Body mass index (BMI) 32.0-32.9, adult: Secondary | ICD-10-CM | POA: Diagnosis not present

## 2024-02-06 DIAGNOSIS — E6609 Other obesity due to excess calories: Secondary | ICD-10-CM | POA: Diagnosis not present

## 2024-02-06 DIAGNOSIS — D500 Iron deficiency anemia secondary to blood loss (chronic): Secondary | ICD-10-CM | POA: Diagnosis not present

## 2024-02-06 NOTE — Patient Instructions (Addendum)
 Voltaren  gel - apply to knees as needed  Iron Deficiency Anemia, Adult  Iron deficiency anemia is when you do not have enough red blood cells or hemoglobin in your blood. This happens because you have too little iron in your body. Hemoglobin carries oxygen to parts of the body. Anemia can cause your body to not get enough oxygen. What are the causes? Not eating enough foods that have iron in them. The body not being able to take in iron well. Blood loss. What increases the risk? Having menstrual periods. Being pregnant. What are the signs or symptoms? Pale skin, lips, and nails. Weakness, dizziness, and getting tired easily. Feeling like you cannot breathe well when moving (shortness of breath). Cold hands and feet. Mild anemia may not cause any symptoms. How is this treated? This condition is treated by finding out why you do not have enough iron and then getting more iron. It may include: Adding foods to your diet that have a lot of iron. Taking iron pills (supplements). If you are pregnant or breastfeeding, you may need to take extra iron. Your diet often does not provide the amount of iron that you need. Getting more vitamin C in your diet. Vitamin C helps your body take in iron. You may need to take iron pills with a glass of orange juice or vitamin C pills. Medicines to make heavy menstrual periods lighter. Surgery or testing procedures to find what is causing the condition. You may need blood tests to see if treatment is working. If the treatment does not seem to be working, you may need more tests. Follow these instructions at home: Medicines Take over-the-counter and prescription medicines only as told by your doctor. This includes iron pills and vitamins. Taking them as told is important because too much iron can be harmful. Take iron pills when your stomach is empty. If you cannot handle this, take them with food. Do not drink milk or take antacids at the same time as your  iron pills. Iron pills may turn your poop (stool)black. If you cannot handle taking iron pills by mouth, ask your doctor about getting iron through: An IV tube. A shot (injection) into a muscle. Eating and drinking Talk with your doctor before changing the foods you eat. Your doctor may tell you to eat foods that have a lot of iron, such as: Liver. Low-fat (lean) beef. Breads and cereals that have iron added to them. Eggs. Dried fruit. Dark green, leafy vegetables. Eat fresh fruits and vegetables that are high in vitamin C. They help your body use iron. Foods with a lot of vitamin C include: Oranges. Peppers. Tomatoes. Mangoes. Managing constipation If you are taking iron pills, they may cause trouble pooping (constipation). To prevent or treat this, you may need to: Drink enough fluid to keep your pee (urine) pale yellow. Take over-the-counter or prescription medicines. Eat foods that are high in fiber. These include beans, whole grains, and fresh fruits and vegetables. Limit foods that are high in fat and sugar. These include fried or sweet foods. General instructions Return to your normal activities when your doctor says that it is safe. Keep all follow-up visits. Contact a doctor if: You feel like you may vomit (nauseous), or you vomit. You feel weak. You get light-headed when getting up from sitting or lying down. You are sweating for no reason. You have trouble pooping. You have worse breathing with physical activity. You have heaviness in your chest. Get help right away if:  You faint. If this happens, do not drive yourself to the hospital. You have a fast heartbeat, or a heartbeat that does not feel regular. Summary Iron deficiency anemia happens when you have too little iron in your body. This condition is treated by finding out why you do not have enough iron in your body and then getting more iron. Take over-the-counter and prescription medicines only as told by  your doctor. Eat fresh fruits and vegetables that are high in vitamin C. Contact a doctor if you have trouble pooping or feel weak. This information is not intended to replace advice given to you by your health care provider. Make sure you discuss any questions you have with your health care provider. Document Revised: 09/15/2021 Document Reviewed: 09/15/2021 Elsevier Patient Education  2024 [employer].

## 2024-02-06 NOTE — Progress Notes (Unsigned)
 I,Victoria T Basil Lim, CMA,acting as a Neurosurgeon for Smiley Dung, MD.,have documented all relevant documentation on the behalf of Smiley Dung, MD,as directed by  Smiley Dung, MD while in the presence of Smiley Dung, MD.  Subjective:  Patient ID: Richard Sampson , male    DOB: 10-06-1945 , 78 y.o.   MRN: [id number]  Chief Complaint  Patient presents with   Anemia    Patient presents today for anemia follow up. He reports compliance with iron supplement. Denies headache, chest pain & sob. He does not have any specific questions or concerns.     HPI  HPI   Past Medical History:  Diagnosis Date   Agatston coronary artery calcium  score between 100 and 199    a. 12/2019 Cardiac CT: 3 vessel cor Ca2+. Ca2+ = 144 (66th %i'le); b. 12/2019 MV: EF 60%, No ischemia/infarct. Ex time 7:30, Max HR 139.   Heart murmur    a. 09/2015 Echo: EF 55-60%, no rwma, GrI DD, mildly dil LA/RA.   High cholesterol    History of GIB (gastrointestinal bleeding)    a. Angiodysplasia of intestine several years ago.   Hypertension    PAF (paroxysmal atrial fibrillation) (HCC) 07/17/2014   a. CHA2DS2VASc = 3-->eliquis .   Pneumonia    Thoracic ascending aortic aneurysm (HCC)    a. 09/2020 CT Chest: Stable aneurysmal dilatation of the ascending thoracic aorta measuring 4.3 cm.     Family History  Problem Relation Age of Onset   Thyroid  nodules Mother    Dementia Mother    Heart failure Father    Cancer Father    Kidney disease Son    Diabetes Brother    Cancer Brother    Heart attack Neg Hx    Stroke Neg Hx      Current Outpatient Medications:    amLODipine  (NORVASC ) 10 MG tablet, Take 10 mg by mouth daily., Disp: , Rfl:    apixaban  (ELIQUIS ) 5 MG TABS tablet, Take 1 tablet by mouth 2 times a day. Need to schedule appt with cardiologist for refills., Disp: 60 tablet, Rfl: 0   ascorbic acid (VITAMIN C) 250 MG tablet, Take 250 mg by mouth., Disp: , Rfl:    atorvastatin  (LIPITOR) 40 MG tablet, Take  40 mg by mouth daily., Disp: , Rfl:    camphor-menthol (SARNA) lotion, Apply topically., Disp: , Rfl:    cholecalciferol  (VITAMIN D3) 10 MCG (400 UNIT) TABS tablet, Take 400 Units by mouth., Disp: , Rfl:    cyanocobalamin (VITAMIN B12) 500 MCG tablet, Take 2 tablets by mouth daily., Disp: , Rfl:    ferrous sulfate  325 (65 FE) MG tablet, Take 325 mg by mouth daily with breakfast., Disp: , Rfl:    Iron-FA-B Cmp-C-Biot-Probiotic (FUSION PLUS) CAPS, Take 1 capsule by mouth daily at 6 (six) AM., Disp: , Rfl:    potassium chloride  SA (K-DUR,KLOR-CON ) 20 MEQ tablet, Take 40 mEq by mouth 2 (two) times daily. Morning and evening, Disp: , Rfl:    simethicone  (MYLICON) 80 MG chewable tablet, Chew by mouth., Disp: , Rfl:    tamsulosin (FLOMAX) 0.4 MG CAPS capsule, Take 0.4 mg by mouth., Disp: , Rfl:    triamterene -hydrochlorothiazide  (MAXZIDE ) 75-50 MG per tablet, Take 0.5 tablets by mouth every evening. , Disp: , Rfl:    selenium  sulfide (SELSUN ) 2.5 % lotion, Apply topically daily as needed for irritation. Apply topically. (Patient not taking: Reported on 02/06/2024), Disp: 118 mL, Rfl: 1   Allergies  Allergen Reactions   Ace Inhibitors Swelling    Throat swelling; pt was on lisinopril   Angiotensin Receptor Blockers Swelling    Cross reactivity in known ACE I allergy   Lisinopril Swelling    Throat swelling   Losartan Other (See Comments)    Doctor told pt not to take     Review of Systems  Constitutional: Negative.   HENT: Negative.    Respiratory: Negative.    Cardiovascular: Negative.   Endocrine: Negative.   Skin: Negative.   Allergic/Immunologic: Negative.   Neurological: Negative.      Today's Vitals   02/06/24 1131  BP: 128/80  Temp: 98.3 F (36.8 C)  SpO2: 98%  Weight: 224 lb 9.6 oz (101.9 kg)  Height: 5' 10 (1.778 m)   Body mass index is 32.23 kg/m.  Wt Readings from Last 3 Encounters:  02/06/24 224 lb 9.6 oz (101.9 kg)  11/20/23 223 lb (101.2 kg)  05/31/23 217 lb  12.8 oz (98.8 kg)     Objective:  Physical Exam      Assessment And Plan:  History of iron deficiency anemia  Class 1 obesity due to excess calories with serious comorbidity and body mass index (BMI) of 32.0 to 32.9 in adult     Return if symptoms worsen or fail to improve.  Patient was given opportunity to ask questions. Patient verbalized understanding of the plan and was able to repeat key elements of the plan. All questions were answered to their satisfaction.    I, Smiley Dung, MD, have reviewed all documentation for this visit. The documentation on 02/06/24 for the exam, diagnosis, procedures, and orders are all accurate and complete.   IF YOU HAVE BEEN REFERRED TO A SPECIALIST, IT MAY TAKE 1-2 WEEKS TO SCHEDULE/PROCESS THE REFERRAL. IF YOU HAVE NOT HEARD FROM US /SPECIALIST IN TWO WEEKS, PLEASE GIVE US  A CALL AT [phone] X [phone].   THE PATIENT IS ENCOURAGED TO PRACTICE SOCIAL DISTANCING DUE TO THE COVID-19 PANDEMIC.

## 2024-02-07 ENCOUNTER — Ambulatory Visit: Payer: Self-pay | Admitting: Internal Medicine

## 2024-02-07 LAB — CBC
Hematocrit: 40.3 % (ref 37.5–51.0)
Hemoglobin: 12.6 g/dL — ABNORMAL LOW (ref 13.0–17.7)
MCH: 30.3 pg (ref 26.6–33.0)
MCHC: 31.3 g/dL — ABNORMAL LOW (ref 31.5–35.7)
MCV: 97 fL (ref 79–97)
Platelets: 237 10*3/uL (ref 150–450)
RBC: 4.16 x10E6/uL (ref 4.14–5.80)
RDW: 13.1 % (ref 11.6–15.4)
WBC: 3.5 10*3/uL (ref 3.4–10.8)

## 2024-02-07 LAB — IRON,TIBC AND FERRITIN PANEL
Ferritin: 27 ng/mL — ABNORMAL LOW (ref 30–400)
Iron Saturation: 18 % (ref 15–55)
Iron: 62 ug/dL (ref 38–169)
Total Iron Binding Capacity: 339 ug/dL (ref 250–450)
UIBC: 277 ug/dL (ref 111–343)

## 2024-02-11 DIAGNOSIS — D500 Iron deficiency anemia secondary to blood loss (chronic): Secondary | ICD-10-CM | POA: Insufficient documentation

## 2024-02-11 NOTE — Assessment & Plan Note (Signed)
 Symptoms suggest need for regular GERD medication use. - Instruct daily GERD medication use from Monday to Friday. - Stop eating 3 hrs prior to lying down - Avoid known triggers

## 2024-02-11 NOTE — Assessment & Plan Note (Signed)
 Occasional constipation likely due to low water intake and dietary habits. - Advise doubling water intake. - Recommend Miralax for hard stools. - Educate on balancing coffee and Diet Coke intake with water.

## 2024-02-11 NOTE — Assessment & Plan Note (Signed)
 He is encouraged to initially strive for BMI less than 30 to decrease cardiac risk. He is advised to exercise no less than 150 minutes per week.

## 2024-02-11 NOTE — Assessment & Plan Note (Signed)
Chronic, encouraged to comply with statin therapy and follow heart healthy diet. He will continue with atorvastatin 40mg  daily.

## 2024-02-11 NOTE — Assessment & Plan Note (Signed)
 Chronic, he is encouraged to stay well hydrated, avoid NSAIDs and keep BP controlled to prevent progression of CKD.

## 2024-02-11 NOTE — Assessment & Plan Note (Signed)
 Chronic, controlled. He will continue with Maxzide  75/50 1/2 tab daily and amlodipine  10mg  daily. He is reminded to follow a low sodium, heart healthy diet. He will rto in six months for re-evaluation.

## 2024-02-11 NOTE — Assessment & Plan Note (Signed)
 Chronic anemia with low iron levels of unclear etiology. Further investigation warranted due to anemia. - Provide stool cards for fecal occult blood testing on three different days. - Instruct him to continue iron supplements. - Request records from the [place] regarding previous colonoscopies. - Order urinalysis to check for hematuria. - Advise increased water intake to improve bowel movements. - Educate on obtaining and providing medical records from the [place].

## 2024-02-13 ENCOUNTER — Other Ambulatory Visit (INDEPENDENT_AMBULATORY_CARE_PROVIDER_SITE_OTHER): Payer: Self-pay

## 2024-02-13 ENCOUNTER — Ambulatory Visit: Payer: Self-pay | Admitting: Internal Medicine

## 2024-02-13 DIAGNOSIS — D649 Anemia, unspecified: Secondary | ICD-10-CM

## 2024-02-13 LAB — HEMOCCULT GUIAC POC 1CARD (OFFICE)
Card #2 Fecal Occult Blod, POC: POSITIVE
Card #3 Fecal Occult Blood, POC: POSITIVE
Fecal Occult Blood, POC: POSITIVE — AB

## 2024-04-29 DIAGNOSIS — L905 Scar conditions and fibrosis of skin: Secondary | ICD-10-CM | POA: Diagnosis not present

## 2024-04-29 DIAGNOSIS — Z86007 Personal history of in-situ neoplasm of skin: Secondary | ICD-10-CM | POA: Diagnosis not present

## 2024-04-29 DIAGNOSIS — Z85828 Personal history of other malignant neoplasm of skin: Secondary | ICD-10-CM | POA: Diagnosis not present

## 2024-04-29 DIAGNOSIS — Z08 Encounter for follow-up examination after completed treatment for malignant neoplasm: Secondary | ICD-10-CM | POA: Diagnosis not present

## 2024-06-09 ENCOUNTER — Ambulatory Visit: Payer: Self-pay | Admitting: Internal Medicine

## 2024-06-09 VITALS — BP 118/80 | HR 67 | Temp 98.3°F | Ht 70.0 in | Wt 226.2 lb

## 2024-06-09 DIAGNOSIS — I700 Atherosclerosis of aorta: Secondary | ICD-10-CM | POA: Diagnosis not present

## 2024-06-09 DIAGNOSIS — R351 Nocturia: Secondary | ICD-10-CM

## 2024-06-09 DIAGNOSIS — K219 Gastro-esophageal reflux disease without esophagitis: Secondary | ICD-10-CM | POA: Diagnosis not present

## 2024-06-09 DIAGNOSIS — N1831 Chronic kidney disease, stage 3a: Secondary | ICD-10-CM | POA: Diagnosis not present

## 2024-06-09 DIAGNOSIS — I1310 Hypertensive heart and chronic kidney disease without heart failure, with stage 1 through stage 4 chronic kidney disease, or unspecified chronic kidney disease: Secondary | ICD-10-CM | POA: Diagnosis not present

## 2024-06-09 DIAGNOSIS — E7800 Pure hypercholesterolemia, unspecified: Secondary | ICD-10-CM

## 2024-06-09 DIAGNOSIS — Z6832 Body mass index (BMI) 32.0-32.9, adult: Secondary | ICD-10-CM

## 2024-06-09 DIAGNOSIS — D649 Anemia, unspecified: Secondary | ICD-10-CM

## 2024-06-09 DIAGNOSIS — E66811 Obesity, class 1: Secondary | ICD-10-CM | POA: Diagnosis not present

## 2024-06-09 DIAGNOSIS — K5904 Chronic idiopathic constipation: Secondary | ICD-10-CM | POA: Diagnosis not present

## 2024-06-09 DIAGNOSIS — E6609 Other obesity due to excess calories: Secondary | ICD-10-CM

## 2024-06-09 DIAGNOSIS — Z0000 Encounter for general adult medical examination without abnormal findings: Secondary | ICD-10-CM | POA: Diagnosis not present

## 2024-06-09 LAB — POCT URINALYSIS DIP (CLINITEK)
Bilirubin, UA: NEGATIVE
Blood, UA: NEGATIVE
Glucose, UA: NEGATIVE mg/dL
Ketones, POC UA: NEGATIVE mg/dL
Leukocytes, UA: NEGATIVE
Nitrite, UA: NEGATIVE
Spec Grav, UA: 1.02 (ref 1.010–1.025)
Urobilinogen, UA: 0.2 U/dL
pH, UA: 6.5 (ref 5.0–8.0)

## 2024-06-09 MED ORDER — FAMOTIDINE 20 MG PO TABS: ORAL_TABLET | ORAL | 1 refills | Status: AC

## 2024-06-09 NOTE — Assessment & Plan Note (Signed)
Chronic, encouraged to comply with statin therapy and follow heart healthy diet. He will continue with atorvastatin 40mg  daily.

## 2024-06-09 NOTE — Progress Notes (Signed)
 I,Victoria T Emmitt, CMA,acting as a neurosurgeon for Catheryn LOISE Slocumb, MD.,have documented all relevant documentation on the behalf of Catheryn LOISE Slocumb, MD,as directed by  Catheryn LOISE Slocumb, MD while in the presence of Catheryn LOISE Slocumb, MD.  Subjective:   Patient ID: Richard Sampson , male    DOB: [birth year] , 78 y.o.   MRN: [id number]  Chief Complaint  Patient presents with   Annual Exam    He is here today for a full physical exam. He has no specific concerns or complaints at this time. He has his Urology exams performed at the [facility]. He reports compliance with meds. He denies headaches, chest pain and shortness of breath.    Hypertension   Hyperlipidemia    HPI Discussed the use of AI scribe software for clinical note transcription with the patient, who gave verbal consent to proceed.  History of Present Illness Richard Sampson is a 78 year old male who presents for a routine physical exam and blood pressure check. He is accompanied by his wife, who is in the lobby.  He has not been exercising recently due to family issues and travel to [place] .  He has a history of renal cell carcinoma for which he underwent a partial nephrectomy. The cancer was removed. A CT scan was performed earlier this year. He follows up with a urologist and is due for blood work, including a PSA test, before his next appointment.  He has stage 3 chronic kidney disease and was slightly anemic in June. He is scheduled for a recheck of his blood count today. No recent visits to the [facility], but he mentions an upcoming appointment either in December or January.  He experiences nocturia and urinary frequency. He is on Flomax. He also reports occasional reflux symptoms and burping.  His current medications include amlodipine  10 mg for blood pressure, Eliquis , atorvastatin  40 mg for cholesterol, Flomax, and triamterene  half a tablet daily.  He received a flu shot recently but is unsure of the location, possibly at the [facility] or  [facility].   Hypertension This is a chronic problem. The current episode started more than 1 year ago. The problem has been gradually improving since onset. The problem is controlled. Pertinent negatives include no blurred vision. Risk factors for coronary artery disease include dyslipidemia, obesity, male gender and sedentary lifestyle. Past treatments include calcium  channel blockers and diuretics. The current treatment provides moderate improvement. Compliance problems include exercise.      Past Medical History:  Diagnosis Date   Agatston coronary artery calcium  score between 100 and 199    a. 12/2019 Cardiac CT: 3 vessel cor Ca2+. Ca2+ = 144 (66th %i'le); b. 12/2019 MV: EF 60%, No ischemia/infarct. Ex time 7:30, Max HR 139.   Heart murmur    a. 09/2015 Echo: EF 55-60%, no rwma, GrI DD, mildly dil LA/RA.   High cholesterol    History of GIB (gastrointestinal bleeding)    a. Angiodysplasia of intestine several years ago.   Hypertension    PAF (paroxysmal atrial fibrillation) (HCC) 07/17/2014   a. CHA2DS2VASc = 3-->eliquis .   Pneumonia    Thoracic ascending aortic aneurysm    a. 09/2020 CT Chest: Stable aneurysmal dilatation of the ascending thoracic aorta measuring 4.3 cm.     Family History  Problem Relation Age of Onset   Thyroid  nodules Mother    Dementia Mother    Heart failure Father    Cancer Father    Kidney disease Son  Diabetes Brother    Cancer Brother    Heart attack Neg Hx    Stroke Neg Hx      Current Outpatient Medications:    amLODipine  (NORVASC ) 10 MG tablet, Take 10 mg by mouth daily., Disp: , Rfl:    apixaban  (ELIQUIS ) 5 MG TABS tablet, Take 1 tablet by mouth 2 times a day. Need to schedule appt with cardiologist for refills., Disp: 60 tablet, Rfl: 0   ascorbic acid (VITAMIN C) 250 MG tablet, Take 250 mg by mouth., Disp: , Rfl:    atorvastatin  (LIPITOR) 40 MG tablet, Take 40 mg by mouth daily., Disp: , Rfl:    camphor-menthol (SARNA) lotion, Apply  topically., Disp: , Rfl:    cholecalciferol  (VITAMIN D3) 10 MCG (400 UNIT) TABS tablet, Take 400 Units by mouth., Disp: , Rfl:    cyanocobalamin (VITAMIN B12) 500 MCG tablet, Take 2 tablets by mouth daily., Disp: , Rfl:    famotidine  (PEPCID ) 20 MG tablet, One tab po qd, Disp: 90 tablet, Rfl: 1   ferrous sulfate  325 (65 FE) MG tablet, Take 325 mg by mouth daily with breakfast., Disp: , Rfl:    Iron-FA-B Cmp-C-Biot-Probiotic (FUSION PLUS) CAPS, Take 1 capsule by mouth daily at 6 (six) AM., Disp: , Rfl:    potassium chloride  SA (K-DUR,KLOR-CON ) 20 MEQ tablet, Take 40 mEq by mouth 2 (two) times daily. Morning and evening, Disp: , Rfl:    simethicone  (MYLICON) 80 MG chewable tablet, Chew by mouth., Disp: , Rfl:    tamsulosin (FLOMAX) 0.4 MG CAPS capsule, Take 0.4 mg by mouth., Disp: , Rfl:    triamterene -hydrochlorothiazide  (MAXZIDE ) 75-50 MG per tablet, Take 0.5 tablets by mouth every evening. , Disp: , Rfl:    selenium  sulfide (SELSUN ) 2.5 % lotion, Apply topically daily as needed for irritation. Apply topically. (Patient not taking: Reported on 06/09/2024), Disp: 118 mL, Rfl: 1   Allergies  Allergen Reactions   Ace Inhibitors Swelling    Throat swelling; pt was on lisinopril   Angiotensin Receptor Blockers Swelling    Cross reactivity in known ACE I allergy   Lisinopril Swelling    Throat swelling   Losartan Other (See Comments)    Doctor told pt not to take     Men's preventive visit. Patient Health Questionnaire (PHQ-2) is  Flowsheet Row Clinical Support from 11/07/2023 in [facility]  PHQ-2 Total Score 0  . Patient is on a healthy diet. Marital status: Married. Relevant history for alcohol use is:  Social History   Substance and Sexual Activity  Alcohol Use Not Currently   Comment: 1.5 oz every three to four months  . Relevant history for tobacco use is:  Social History   Tobacco Use  Smoking Status Every Day   Types: Cigars  Smokeless  Tobacco Never  Tobacco Comments   Started at age 66 - 1ppdx 85, quit x 5; pipex5 years, cigarsx15   April 2025, he is now smoking 4 cigars daily  .   Review of Systems  Constitutional: Negative.   HENT: Negative.    Eyes:  Negative for blurred vision.  Respiratory: Negative.    Cardiovascular: Negative.   Gastrointestinal:        He c/o reflux sx. No n/v, occasional heartburn.   Endocrine: Negative.   Genitourinary: Negative.        He c/o nocturia.   Musculoskeletal: Negative.   Skin: Negative.   Allergic/Immunologic: Negative.   Neurological: Negative.   Hematological:  Negative.      Today's Vitals   06/09/24 0916  BP: 118/80  Pulse: 67  Temp: 98.3 F (36.8 C)  SpO2: 98%  Weight: 226 lb 3.2 oz (102.6 kg)  Height: 5' 10 (1.778 m)   Body mass index is 32.46 kg/m.  Wt Readings from Last 3 Encounters:  06/09/24 226 lb 3.2 oz (102.6 kg)  02/06/24 224 lb 9.6 oz (101.9 kg)  11/20/23 223 lb (101.2 kg)    Objective:  Physical Exam Vitals and nursing note reviewed.  Constitutional:      Appearance: Normal appearance. He is obese.  HENT:     Head: Normocephalic and atraumatic.     Right Ear: Tympanic membrane, ear canal and external ear normal.     Left Ear: Tympanic membrane, ear canal and external ear normal.     Nose: Nose normal.     Mouth/Throat:     Mouth: Mucous membranes are moist.     Pharynx: Oropharynx is clear.  Eyes:     Extraocular Movements: Extraocular movements intact.     Conjunctiva/sclera: Conjunctivae normal.     Pupils: Pupils are equal, round, and reactive to light.  Cardiovascular:     Rate and Rhythm: Normal rate and regular rhythm.     Pulses: Normal pulses.     Heart sounds: Normal heart sounds.  Pulmonary:     Effort: Pulmonary effort is normal.     Breath sounds: Normal breath sounds.  Chest:  Breasts:    Right: Normal. No swelling, bleeding, inverted nipple, mass or nipple discharge.     Left: Normal. No swelling, bleeding,  inverted nipple, mass or nipple discharge.  Abdominal:     General: Bowel sounds are normal.     Palpations: Abdomen is soft.     Comments: Healed surgical scar  Genitourinary:    Comments: Deferred  Musculoskeletal:        General: Normal range of motion.     Cervical back: Normal range of motion and neck supple.  Skin:    General: Skin is warm.  Neurological:     General: No focal deficit present.     Mental Status: He is alert.  Psychiatric:        Mood and Affect: Mood normal.        Behavior: Behavior normal.         Assessment And Plan:    Encounter for general adult medical examination w/o abnormal findings Assessment & Plan: A full exam was performed.  DRE deferred, per patient request.  He is advised to get 30-45 minutes of regular exercise, no less than four to five days per week. Both weight-bearing and aerobic exercises are recommended.  He is advised to follow a healthy diet with at least six fruits/veggies per day, decrease intake of red meat and other saturated fats and to increase fish intake to twice weekly.  Meats/fish should not be fried -- baked, boiled or broiled is preferable. It is also important to cut back on your sugar intake.  Be sure to read labels - try to avoid anything with added sugar, high fructose corn syrup or other sweeteners.  If you must use a sweetener, you can try stevia or monkfruit.  It is also important to avoid artificially sweetened foods/beverages and diet drinks. Lastly, wear SPF 50 sunscreen on exposed skin and when in direct sunlight for an extended period of time.  Be sure to avoid fast food restaurants and aim for at least 60  ounces of water daily.       Hypertensive heart and renal disease with renal failure, stage 1 through stage 4 or unspecified chronic kidney disease, without heart failure Assessment & Plan: Chronic, controlled. He will continue with Maxzide  75/50 1/2 tab daily and amlodipine  10mg  daily. He is reminded to follow a  low sodium, heart healthy diet. He will rto in six months for re-evaluation.  EKG performed: atrial rhythm, incomplete RBBB, LAFB, anterolateral ST elevation and nonspecific T abnormality.  - Follow low sodium diet    Orders: -     CMP14+EGFR -     Lipid panel -     CBC -     POCT URINALYSIS DIP (CLINITEK) -     Microalbumin / creatinine urine ratio -     EKG 12-Lead  Chronic kidney disease, stage 3a (HCC) Assessment & Plan: Chronic, he is encouraged to stay well hydrated, avoid NSAIDs and keep BP controlled to prevent progression of CKD.    Orders: -     CMP14+EGFR -     CBC -     PTH, intact and calcium  -     Phosphorus -     Protein electrophoresis, serum  Atherosclerosis of aorta Assessment & Plan: Chronic, encouraged to comply with statin therapy and follow heart healthy diet. He will continue with atorvastatin  40mg  daily.    Pure hypercholesterolemia Assessment & Plan: Chronic, he will continue with atorvastatin  40mg  daily. He is encouraged to follow a heart healthy lifestyle.   Orders: -     CMP14+EGFR -     Lipid panel -     TSH  Gastroesophageal reflux disease without esophagitis Assessment & Plan: - Prescribe famotidine  once daily, increase to twice daily if needed. - Stop eating at least 3 hours prior to lying down.    Nocturia -     PSA  Class 1 obesity due to excess calories with serious comorbidity and body mass index (BMI) of 32.0 to 32.9 in adult Assessment & Plan: He is encouraged to initially strive for BMI less than 30 to decrease cardiac risk. He is advised to exercise no less than 150 minutes per week.     Chronic idiopathic constipation Assessment & Plan: Occasional constipation likely due to low water intake and dietary habits. - Advise doubling water intake. - Recommend Miralax for hard stools. - Educate on balancing coffee and Diet Coke intake with water.   Other orders -     Famotidine ; One tab po qd  Dispense: 90 tablet; Refill:  1   History of renal cell carcinoma, status post partial nephrectomy In remission, recent CT negative for recurrence. - Send lab results to urologist for review. Return for 1 YEAR HM, 6 month bp. Patient was given opportunity to ask questions. Patient verbalized understanding of the plan and was able to repeat key elements of the plan. All questions were answered to their satisfaction.    I, Catheryn LOISE Slocumb, MD, have reviewed all documentation for this visit. The documentation on 06/09/24 for the exam, diagnosis, procedures, and orders are all accurate and complete.

## 2024-06-09 NOTE — Patient Instructions (Signed)
 Health Maintenance, Male  Adopting a healthy lifestyle and getting preventive care are important in promoting health and wellness. Ask your health care provider about:  The right schedule for you to have regular tests and exams.  Things you can do on your own to prevent diseases and keep yourself healthy.  What should I know about diet, weight, and exercise?  Eat a healthy diet    Eat a diet that includes plenty of vegetables, fruits, low-fat dairy products, and lean protein.  Do not eat a lot of foods that are high in solid fats, added sugars, or sodium.  Maintain a healthy weight  Body mass index (BMI) is a measurement that can be used to identify possible weight problems. It estimates body fat based on height and weight. Your health care provider can help determine your BMI and help you achieve or maintain a healthy weight.  Get regular exercise  Get regular exercise. This is one of the most important things you can do for your health. Most adults should:  Exercise for at least 150 minutes each week. The exercise should increase your heart rate and make you sweat (moderate-intensity exercise).  Do strengthening exercises at least twice a week. This is in addition to the moderate-intensity exercise.  Spend less time sitting. Even light physical activity can be beneficial.  Watch cholesterol and blood lipids  Have your blood tested for lipids and cholesterol at 76 years of age, then have this test every 5 years.  You may need to have your cholesterol levels checked more often if:  Your lipid or cholesterol levels are high.  You are older than 76 years of age.  You are at high risk for heart disease.  What should I know about cancer screening?  Many types of cancers can be detected early and may often be prevented. Depending on your health history and family history, you may need to have cancer screening at various ages. This may include screening for:  Colorectal cancer.  Prostate cancer.  Skin cancer.  Lung  cancer.  What should I know about heart disease, diabetes, and high blood pressure?  Blood pressure and heart disease  High blood pressure causes heart disease and increases the risk of stroke. This is more likely to develop in people who have high blood pressure readings or are overweight.  Talk with your health care provider about your target blood pressure readings.  Have your blood pressure checked:  Every 3-5 years if you are 24-52 years of age.  Every year if you are 76 years old or older.  If you are between the ages of 60 and 72 and are a current or former smoker, ask your health care provider if you should have a one-time screening for abdominal aortic aneurysm (AAA).  Diabetes  Have regular diabetes screenings. This checks your fasting blood sugar level. Have the screening done:  Once every three years after age 76 if you are at a normal weight and have a low risk for diabetes.  More often and at a younger age if you are overweight or have a high risk for diabetes.  What should I know about preventing infection?  Hepatitis B  If you have a higher risk for hepatitis B, you should be screened for this virus. Talk with your health care provider to find out if you are at risk for hepatitis B infection.  Hepatitis C  Blood testing is recommended for:  Everyone born from [birth year] through 1963.  Anyone  with known risk factors for hepatitis C.  Sexually transmitted infections (STIs)  You should be screened each year for STIs, including gonorrhea and chlamydia, if:  You are sexually active and are younger than 76 years of age.  You are older than 76 years of age and your health care provider tells you that you are at risk for this type of infection.  Your sexual activity has changed since you were last screened, and you are at increased risk for chlamydia or gonorrhea. Ask your health care provider if you are at risk.  Ask your health care provider about whether you are at high risk for HIV. Your health care provider  may recommend a prescription medicine to help prevent HIV infection. If you choose to take medicine to prevent HIV, you should first get tested for HIV. You should then be tested every 3 months for as long as you are taking the medicine.  Follow these instructions at home:  Alcohol use  Do not drink alcohol if your health care provider tells you not to drink.  If you drink alcohol:  Limit how much you have to 0-2 drinks a day.  Know how much alcohol is in your drink. In the [place] one drink equals one 12 oz bottle of beer (355 mL), one 5 oz glass of wine (148 mL), or one 1 oz glass of hard liquor (44 mL).  Lifestyle  Do not use any products that contain nicotine or tobacco. These products include cigarettes, chewing tobacco, and vaping devices, such as e-cigarettes. If you need help quitting, ask your health care provider.  Do not use street drugs.  Do not share needles.  Ask your health care provider for help if you need support or information about quitting drugs.  General instructions  Schedule regular health, dental, and eye exams.  Stay current with your vaccines.  Tell your health care provider if:  You often feel depressed.  You have ever been abused or do not feel safe at home.  Summary  Adopting a healthy lifestyle and getting preventive care are important in promoting health and wellness.  Follow your health care provider's instructions about healthy diet, exercising, and getting tested or screened for diseases.  Follow your health care provider's instructions on monitoring your cholesterol and blood pressure.  This information is not intended to replace advice given to you by your health care provider. Make sure you discuss any questions you have with your health care provider.  Document Revised: 12/27/2020 Document Reviewed: 12/27/2020  Elsevier Patient Education  2024 [employer].

## 2024-06-10 LAB — PROTEIN ELECTROPHORESIS, SERUM
A/G Ratio: 1.2 (ref 0.7–1.7)
Albumin ELP: 3.7 g/dL (ref 2.9–4.4)
Alpha 1: 0.2 g/dL (ref 0.0–0.4)
Alpha 2: 0.6 g/dL (ref 0.4–1.0)
Beta: 1.1 g/dL (ref 0.7–1.3)
Gamma Globulin: 1.3 g/dL (ref 0.4–1.8)
Globulin, Total: 3.2 g/dL (ref 2.2–3.9)

## 2024-06-10 LAB — CMP14+EGFR
ALT: 15 IU/L (ref 0–44)
AST: 14 IU/L (ref 0–40)
Albumin: 4.4 g/dL (ref 3.8–4.8)
Alkaline Phosphatase: 89 IU/L (ref 47–123)
BUN/Creatinine Ratio: 13 (ref 10–24)
BUN: 17 mg/dL (ref 8–27)
Bilirubin Total: 0.4 mg/dL (ref 0.0–1.2)
CO2: 24 mmol/L (ref 20–29)
Calcium: 9.6 mg/dL (ref 8.6–10.2)
Chloride: 102 mmol/L (ref 96–106)
Creatinine, Ser: 1.33 mg/dL — ABNORMAL HIGH (ref 0.76–1.27)
Globulin, Total: 2.5 g/dL (ref 1.5–4.5)
Glucose: 86 mg/dL (ref 70–99)
Potassium: 3.8 mmol/L (ref 3.5–5.2)
Sodium: 141 mmol/L (ref 134–144)
Total Protein: 6.9 g/dL (ref 6.0–8.5)
eGFR: 55 mL/min/1.73 — ABNORMAL LOW (ref >59)

## 2024-06-10 LAB — CBC
Hematocrit: 39.9 % (ref 37.5–51.0)
Hemoglobin: 12.8 g/dL — ABNORMAL LOW (ref 13.0–17.7)
MCH: 30.7 pg (ref 26.6–33.0)
MCHC: 32.1 g/dL (ref 31.5–35.7)
MCV: 96 fL (ref 79–97)
Platelets: 230 x10E3/uL (ref 150–450)
RBC: 4.17 x10E6/uL (ref 4.14–5.80)
RDW: 13.5 % (ref 11.6–15.4)
WBC: 4 x10E3/uL (ref 3.4–10.8)

## 2024-06-10 LAB — MICROALBUMIN / CREATININE URINE RATIO
Creatinine, Urine: 182.4 mg/dL
Microalb/Creat Ratio: 6 mg/g{creat} (ref 0–29)
Microalbumin, Urine: 10.7 ug/mL

## 2024-06-10 LAB — LIPID PANEL
Chol/HDL Ratio: 2.8 ratio (ref 0.0–5.0)
Cholesterol, Total: 150 mg/dL (ref 100–199)
HDL: 53 mg/dL (ref >39)
LDL Chol Calc (NIH): 80 mg/dL (ref 0–99)
Triglycerides: 88 mg/dL (ref 0–149)
VLDL Cholesterol Cal: 17 mg/dL (ref 5–40)

## 2024-06-10 LAB — PSA: Prostate Specific Ag, Serum: 1.7 ng/mL (ref 0.0–4.0)

## 2024-06-10 LAB — TSH: TSH: 1 u[IU]/mL (ref 0.450–4.500)

## 2024-06-10 LAB — PTH, INTACT AND CALCIUM: PTH: 38 pg/mL (ref 15–65)

## 2024-06-10 LAB — PHOSPHORUS: Phosphorus: 3.2 mg/dL (ref 2.8–4.1)

## 2024-06-15 DIAGNOSIS — D649 Anemia, unspecified: Secondary | ICD-10-CM | POA: Insufficient documentation

## 2024-06-15 NOTE — Assessment & Plan Note (Signed)
Chronic, he will continue with atorvastatin 40mg  daily. He is encouraged to follow a heart healthy lifestyle.

## 2024-06-15 NOTE — Assessment & Plan Note (Signed)
 Occasional constipation likely due to low water intake and dietary habits. - Advise doubling water intake. - Recommend Miralax for hard stools. - Educate on balancing coffee and Diet Coke intake with water.

## 2024-06-15 NOTE — Assessment & Plan Note (Addendum)
-   Prescribe famotidine  once daily, increase to twice daily if needed. - Stop eating at least 3 hours prior to lying down.

## 2024-06-15 NOTE — Assessment & Plan Note (Signed)
 He is encouraged to initially strive for BMI less than 30 to decrease cardiac risk. He is advised to exercise no less than 150 minutes per week.

## 2024-06-15 NOTE — Assessment & Plan Note (Signed)
 Possibly associated with CKD.  Re-evaluation needed to monitor anemia status. - Order complete blood count.

## 2024-06-15 NOTE — Assessment & Plan Note (Signed)
 Chronic, controlled. He will continue with Maxzide  75/50 1/2 tab daily and amlodipine  10mg  daily. He is reminded to follow a low sodium, heart healthy diet. He will rto in six months for re-evaluation.  EKG performed: atrial rhythm, incomplete RBBB, LAFB, anterolateral ST elevation and nonspecific T abnormality.  - Follow low sodium diet

## 2024-06-15 NOTE — Assessment & Plan Note (Signed)
 Chronic, he is encouraged to stay well hydrated, avoid NSAIDs and keep BP controlled to prevent progression of CKD.

## 2024-06-15 NOTE — Assessment & Plan Note (Signed)

## 2024-07-03 DIAGNOSIS — D6869 Other thrombophilia: Secondary | ICD-10-CM | POA: Diagnosis not present

## 2024-07-03 DIAGNOSIS — N1831 Chronic kidney disease, stage 3a: Secondary | ICD-10-CM | POA: Diagnosis not present

## 2024-07-03 DIAGNOSIS — F1221 Cannabis dependence, in remission: Secondary | ICD-10-CM | POA: Diagnosis not present

## 2024-07-03 DIAGNOSIS — E785 Hyperlipidemia, unspecified: Secondary | ICD-10-CM | POA: Diagnosis not present

## 2024-07-03 DIAGNOSIS — K219 Gastro-esophageal reflux disease without esophagitis: Secondary | ICD-10-CM | POA: Diagnosis not present

## 2024-07-03 DIAGNOSIS — Z7901 Long term (current) use of anticoagulants: Secondary | ICD-10-CM | POA: Diagnosis not present

## 2024-07-03 DIAGNOSIS — K5900 Constipation, unspecified: Secondary | ICD-10-CM | POA: Diagnosis not present

## 2024-07-03 DIAGNOSIS — I4891 Unspecified atrial fibrillation: Secondary | ICD-10-CM | POA: Diagnosis not present

## 2024-07-03 DIAGNOSIS — E669 Obesity, unspecified: Secondary | ICD-10-CM | POA: Diagnosis not present

## 2024-12-11 ENCOUNTER — Ambulatory Visit: Payer: Self-pay | Admitting: Internal Medicine

## 2024-12-17 ENCOUNTER — Ambulatory Visit: Payer: Self-pay

## 2024-12-17 ENCOUNTER — Ambulatory Visit

## 2025-06-16 ENCOUNTER — Encounter: Payer: Self-pay | Admitting: Internal Medicine
